# Patient Record
Sex: Male | Born: 1940 | Race: White | Hispanic: No | Smoking: Never smoker
Health system: Southern US, Community
[De-identification: ages and names within clinical notes are randomized; demographics above are authoritative.]

## PROBLEM LIST (undated history)

## (undated) DIAGNOSIS — N529 Male erectile dysfunction, unspecified: Secondary | ICD-10-CM

## (undated) DIAGNOSIS — E669 Obesity, unspecified: Secondary | ICD-10-CM

## (undated) DIAGNOSIS — I10 Essential (primary) hypertension: Secondary | ICD-10-CM

## (undated) DIAGNOSIS — E785 Hyperlipidemia, unspecified: Secondary | ICD-10-CM

## (undated) DIAGNOSIS — J309 Allergic rhinitis, unspecified: Secondary | ICD-10-CM

## (undated) DIAGNOSIS — J42 Unspecified chronic bronchitis: Secondary | ICD-10-CM

## (undated) DIAGNOSIS — T783XXA Angioneurotic edema, initial encounter: Secondary | ICD-10-CM

## (undated) DIAGNOSIS — E119 Type 2 diabetes mellitus without complications: Secondary | ICD-10-CM

## (undated) HISTORY — DX: Hyperlipidemia, unspecified: E78.5

## (undated) HISTORY — DX: Obesity, unspecified: E66.9

## (undated) HISTORY — DX: Angioneurotic edema, initial encounter: T78.3XXA

## (undated) HISTORY — PX: TONSILLECTOMY: SUR1361

## (undated) HISTORY — DX: Type 2 diabetes mellitus without complications: E11.9

## (undated) HISTORY — PX: CATARACT EXTRACTION: SUR2

## (undated) HISTORY — PX: ADENOIDECTOMY: SUR15

## (undated) HISTORY — DX: Essential (primary) hypertension: I10

## (undated) HISTORY — DX: Unspecified chronic bronchitis: J42

## (undated) HISTORY — DX: Allergic rhinitis, unspecified: J30.9

## (undated) HISTORY — DX: Male erectile dysfunction, unspecified: N52.9

---

## 1998-04-11 ENCOUNTER — Ambulatory Visit (HOSPITAL_BASED_OUTPATIENT_CLINIC_OR_DEPARTMENT_OTHER): Admission: RE | Admit: 1998-04-11 | Discharge: 1998-04-11 | Payer: Self-pay | Admitting: Orthopedic Surgery

## 2003-11-16 ENCOUNTER — Ambulatory Visit (HOSPITAL_COMMUNITY): Admission: RE | Admit: 2003-11-16 | Discharge: 2003-11-16 | Payer: Self-pay | Admitting: Gastroenterology

## 2006-09-17 ENCOUNTER — Emergency Department (HOSPITAL_COMMUNITY): Admission: EM | Admit: 2006-09-17 | Discharge: 2006-09-17 | Payer: Self-pay | Admitting: Emergency Medicine

## 2007-08-27 ENCOUNTER — Emergency Department (HOSPITAL_COMMUNITY): Admission: EM | Admit: 2007-08-27 | Discharge: 2007-08-27 | Payer: Self-pay | Admitting: Emergency Medicine

## 2009-11-30 ENCOUNTER — Ambulatory Visit (HOSPITAL_BASED_OUTPATIENT_CLINIC_OR_DEPARTMENT_OTHER): Admission: RE | Admit: 2009-11-30 | Discharge: 2009-11-30 | Payer: Self-pay | Admitting: Orthopedic Surgery

## 2010-12-15 LAB — BASIC METABOLIC PANEL
BUN: 11 mg/dL (ref 6–23)
CO2: 26 mEq/L (ref 19–32)
Calcium: 8.3 mg/dL — ABNORMAL LOW (ref 8.4–10.5)
Chloride: 103 mEq/L (ref 96–112)
Creatinine, Ser: 1.13 mg/dL (ref 0.4–1.5)
GFR calc Af Amer: >60 mL/min (ref >60)
GFR calc non Af Amer: >60 mL/min (ref >60)
Glucose, Bld: 105 mg/dL — ABNORMAL HIGH (ref 70–99)
Potassium: 3.9 mEq/L (ref 3.5–5.1)
Sodium: 134 mEq/L — ABNORMAL LOW (ref 135–145)

## 2010-12-15 LAB — POCT HEMOGLOBIN-HEMACUE: Hemoglobin: 16.4 g/dL (ref 13.0–17.0)

## 2011-02-07 NOTE — Op Note (Signed)
NAME:  Walter Baxter, Walter Baxter NO.:  [id number]   MEDICAL RECORD NO.:  [id number]                   PATIENT TYPE:  AMB   LOCATION:  ENDO                                 FACILITY:  [facility]   PHYSICIAN:  Danise Edge, M.D.                DATE OF BIRTH:  1941/04/21   DATE OF PROCEDURE:  DATE OF DISCHARGE:                                 OPERATIVE REPORT   REFERRING PHYSICIAN:  Donia Guiles, M.D.   PROCEDURE INDICATIONS:  Mr. Walter Baxter is a 70 year old male born on  March 02, 1941.  Mr. Walter Baxter is scheduled to undergo a screening  colonoscopy with polypectomy to prevent colon cancer.   ENDOSCOPIST:  Danise Edge, M.D.   PREMEDICATION:  Versed 10 mg, Demerol 50 mg.   PROCEDURE:  After obtaining informed consent, Mr. Walter Baxter was placed in the  left lateral decubitus position.  I administered intravenous Demerol and  intravenous Versed to achieve conscious sedation for the procedure.  The  patient's blood pressure, oxygen saturation, and cardiac rhythm were  monitored throughout the procedure and documented in the medical record.  Anal inspection was normal.  Digital rectal exam was normal.  The Olympus  adjustable pediatric colonoscope was introduced into the rectum and advanced  into the cecum.  Colonic preparation for the exam today was excellent.  Rectum:  Normal.  Sigmoid colon and descending colon normal.  Splenic  flexure normal.  Transverse colon normal.  Hepatic flexure normal.  Ascending colon normal.  Cecum and ileocecal valve normal.   ASSESSMENT:  Normal screening proctocolonoscopy to the cecum.  No endoscopic  evidence for the presence of colorectal neoplasia.  Walter Baxter does have a  few small colonic diverticula.                                               Danise Edge, M.D.    MJ/MEDQ  D:  11/16/2003  T:  11/16/2003  Job:  [id number]   cc:   Donia Guiles, M.D.  [address] [place]  [place] [address]  Fax: [phone]

## 2011-02-10 ENCOUNTER — Emergency Department (HOSPITAL_COMMUNITY)
Admission: EM | Admit: 2011-02-10 | Discharge: 2011-02-10 | Disposition: A | Discharge status: Home / Self Care | Payer: Medicare Other | Attending: Emergency Medicine | Admitting: Emergency Medicine

## 2011-02-10 ENCOUNTER — Emergency Department (HOSPITAL_COMMUNITY): Payer: Medicare Other

## 2011-02-10 DIAGNOSIS — E785 Hyperlipidemia, unspecified: Secondary | ICD-10-CM | POA: Insufficient documentation

## 2011-02-10 DIAGNOSIS — I4510 Unspecified right bundle-branch block: Secondary | ICD-10-CM | POA: Insufficient documentation

## 2011-02-10 DIAGNOSIS — I10 Essential (primary) hypertension: Secondary | ICD-10-CM | POA: Insufficient documentation

## 2011-02-10 DIAGNOSIS — R0789 Other chest pain: Secondary | ICD-10-CM | POA: Insufficient documentation

## 2011-02-10 LAB — POCT I-STAT, CHEM 8
BUN: 20 mg/dL (ref 6–23)
Calcium, Ion: 0.95 mmol/L — ABNORMAL LOW (ref 1.12–1.32)
Chloride: 111 mEq/L (ref 96–112)
Creatinine, Ser: 1.1 mg/dL (ref 0.4–1.5)
Glucose, Bld: 120 mg/dL — ABNORMAL HIGH (ref 70–99)
HCT: 43 % (ref 39.0–52.0)
Hemoglobin: 14.6 g/dL (ref 13.0–17.0)
Potassium: 3.8 mEq/L (ref 3.5–5.1)
Sodium: 141 mEq/L (ref 135–145)
TCO2: 19 mmol/L (ref 0–100)

## 2011-02-10 LAB — POCT CARDIAC MARKERS
CKMB, poc: <1 ng/mL — ABNORMAL LOW (ref 1.0–8.0)
Myoglobin, poc: 85 ng/mL (ref 12–200)
Troponin i, poc: <0.05 ng/mL (ref 0.00–0.09)

## 2011-06-30 LAB — CBC
HCT: 44.1
Hemoglobin: 15.1
MCHC: 34.2
MCV: 87.4
Platelets: 203
RBC: 5.05
RDW: 13.4
WBC: 8.8

## 2011-06-30 LAB — DIFFERENTIAL
Basophils Absolute: 0
Basophils Relative: 0
Eosinophils Absolute: 0.3
Eosinophils Relative: 3
Lymphocytes Relative: 34
Lymphs Abs: 2.9
Monocytes Absolute: 0.7
Monocytes Relative: 8
Neutro Abs: 4.8
Neutrophils Relative %: 55

## 2011-06-30 LAB — COMPREHENSIVE METABOLIC PANEL
ALT: 23
AST: 22
Albumin: 3.7
Alkaline Phosphatase: 75
BUN: 11
CO2: 24
Calcium: 9
Chloride: 103
Creatinine, Ser: 1.07
GFR calc Af Amer: >60
GFR calc non Af Amer: >60
Glucose, Bld: 112 — ABNORMAL HIGH
Potassium: 3.7
Sodium: 135
Total Bilirubin: 0.8
Total Protein: 6.2

## 2011-06-30 LAB — POCT CARDIAC MARKERS
CKMB, poc: 1.2
CKMB, poc: 1.4
Myoglobin, poc: 105
Myoglobin, poc: 113
Operator id: 294511
Operator id: 294511
Troponin i, poc: <0.05
Troponin i, poc: <0.05

## 2011-06-30 LAB — I-STAT 8, (EC8 V) (CONVERTED LAB)
Acid-base deficit: 3 — ABNORMAL HIGH
BUN: 12
Bicarbonate: 22.8
Chloride: 107
Glucose, Bld: 105 — ABNORMAL HIGH
HCT: 47
Hemoglobin: 16
Operator id: 294511
Potassium: 3.8
Sodium: 141
TCO2: 24
pCO2, Ven: 41.2 — ABNORMAL LOW
pH, Ven: 7.351 — ABNORMAL HIGH

## 2011-06-30 LAB — POCT I-STAT CREATININE
Creatinine, Ser: 1.1
Operator id: 294511

## 2011-06-30 LAB — LIPASE, BLOOD: Lipase: 23

## 2011-06-30 LAB — D-DIMER, QUANTITATIVE: D-Dimer, Quant: <0.22

## 2012-04-12 ENCOUNTER — Other Ambulatory Visit: Payer: Self-pay | Admitting: Family Medicine

## 2012-04-12 ENCOUNTER — Ambulatory Visit
Admission: RE | Admit: 2012-04-12 | Discharge: 2012-04-12 | Disposition: A | Discharge status: Home / Self Care | Payer: Medicare Other | Source: Ambulatory Visit | Attending: Family Medicine | Admitting: Family Medicine

## 2012-04-12 DIAGNOSIS — R252 Cramp and spasm: Secondary | ICD-10-CM

## 2019-03-18 ENCOUNTER — Other Ambulatory Visit: Payer: Self-pay

## 2019-03-18 ENCOUNTER — Encounter: Payer: Self-pay | Admitting: Allergy

## 2019-03-18 ENCOUNTER — Ambulatory Visit (INDEPENDENT_AMBULATORY_CARE_PROVIDER_SITE_OTHER): Payer: Medicare Other | Admitting: Allergy

## 2019-03-18 VITALS — BP 140/82 | HR 67 | Temp 97.8°F | Resp 18 | Ht 66.25 in | Wt 233.2 lb

## 2019-03-18 DIAGNOSIS — L309 Dermatitis, unspecified: Secondary | ICD-10-CM

## 2019-03-18 MED ORDER — MOMETASONE FUROATE 0.1 % EX OINT: TOPICAL_OINTMENT | CUTANEOUS | 5 refills | Status: DC

## 2019-03-18 MED ORDER — HYDROXYZINE HCL 25 MG PO TABS: ORAL_TABLET | ORAL | 5 refills | Status: DC

## 2019-03-18 NOTE — Patient Instructions (Addendum)
Dermatitis  - environmental allergy skin testing is positive to tree pollen, mold, dust mites, mixed feathers.  Avoidance measures provided and discussed.   - common food allergy skin testing is negative  - recommend perform patch testing to determine if there is a contact allergy component to rash.  Patch testing is performed with TRUE test patch panels (see below for list of what's tested).  Patches are best placed on a Monday in office with readings on Wednesday and Friday of same week.  Patches can not get wet once placed.  Can continue use of antihistamines with patches in place.    - use Mometasone ointment on the rash in thin layer daily as needed  - for nighttime itch control recommend use of Hydroxyzine 25mg  at bedtime if needed  - continue daily moisturization with emollients like CeraVe, Aveeno, Aquafor  Follow-up 3 months or sooner if needed    True Test looks for the following sensitivities:

## 2019-03-18 NOTE — Progress Notes (Signed)
New Patient Note  RE: Walter LorLarry G Tamburri MRN: [id number] DOB: 12-06-1940 Date of Office Visit: 03/18/2019  Referring provider: [name], [name], MD Primary care provider: [name], [name], MD  Chief Complaint: rash  History of present illness: Walter Baxter is a 78 y.o. male presenting today for consultation for rash.   He has a rash that is very itchy.  The rash is on his arms, legs primarily.  He has had this rash for over a year on and off.  The rash does interfere with sleep due to the itch.  If he wears long pajamas he states he doesn't really itch and can sleep better.  He states the rash cleared in Oct/November and then returned around the beginning of the year.  It cleared up again in March/April and returned again in May.  He reports he uses cortisone and neosporin on the rash.     He has seen 2 dermatologists for this rash.  He was prescribed a cream and he is unsure if it helped or not.  He states the cream started with "hydro".  He states the 1st dermatologist in September told him it could be related exposure to "mites".   He saw a 2nd dermatologist in November and had some type of procedure done but states it was not a biopsy.  He is unsure what was done at this visit.    He states he had his house fumigated but insects in October.    With the rash he denies any swelling, joint aches/pains, fevers.  Denies any preceding illnesses, no new medications, no new foods, no stings/bites, soaps/lotions/detergents.   He reports his wife does not have a similar rash.   For moisturization he uses either CeraVe or Aveeno.    He takes a Claritin daily for years.  He states he does not have any symptoms of allergic rhinitis or conjunctivitis at this time and feels that the Claritin helps to prevent those symptoms.    He denies history of asthma, eczema of food allergy.     Review of systems: Review of Systems  Constitutional: Negative for chills, fever and malaise/fatigue.  HENT:  Negative for congestion, nosebleeds and sore throat.   Eyes: Negative for pain, discharge and redness.  Respiratory: Negative for cough, shortness of breath and wheezing.   Cardiovascular: Negative for chest pain.  Gastrointestinal: Negative for abdominal pain, constipation, diarrhea, heartburn, nausea and vomiting.  Musculoskeletal: Negative for joint pain.  Skin: Positive for itching and rash.  Neurological: Negative for headaches.    All other systems negative unless noted above in HPI  Past medical history: Past Medical History:  Diagnosis Date  . Allergic rhinitis   . Chronic bronchitis (HCC)   . Erectile dysfunction   . Essential hypertension   . Hyperlipidemia   . Obesity   . Type 2 diabetes mellitus (HCC)     Past surgical history: Past Surgical History:  Procedure Laterality Date  . ADENOIDECTOMY    . TONSILLECTOMY      Family history:  Family History  Problem Relation Age of Onset  . Lupus Sister     Social history: Lives in a home with carpeting in the bedroom and family room with electric heating and central cooling.  No pets in the home.  No concern for water damage, mildew or roaches in the home.  He is retired.  Denies a smoking history.  Medication List: Allergies as of 03/18/2019      Reactions  Other Rash   Magic mouth wash       Medication List       Accurate as of March 18, 2019  1:17 PM. If you have any questions, ask your nurse or doctor.        hydrOXYzine 25 MG tablet Commonly known as: ATARAX/VISTARIL Take 1 tablet by mouth as needed at bedtime Started by: Vandella Ord Charmian Muff, MD   metoprolol tartrate 50 MG tablet Commonly known as: LOPRESSOR TK 1 T PO BID   mometasone 0.1 % ointment Commonly known as: ELOCON Apply thin layer to skin once daily as needed Started by: Florencia Zaccaro Charmian Muff, MD   pravastatin 40 MG tablet Commonly known as: PRAVACHOL TK 1 T PO QD       Known medication allergies: Allergies   Allergen Reactions  . Other Rash    Magic mouth wash      Physical examination: Blood pressure 140/82, pulse 67, temperature 97.8 F (36.6 C), temperature source Temporal, resp. rate 18, height 5' 6.25" (1.683 m), weight 233 lb 3.2 oz (105.8 kg), SpO2 96 %.  General: Alert, interactive, in no acute distress. HEENT: PERRLA, TMs pearly gray, turbinates non-edematous without discharge, post-pharynx non erythematous. Neck: Supple without lymphadenopathy. Lungs: Clear to auscultation without wheezing, rhonchi or rales. {no increased work of breathing. CV: Normal S1, S2 without murmurs. Abdomen: Nondistended, nontender. Skin: Erythematous excoriated papules over bilateral legs and bilateral arm.  Several erythematous linear scratch marks over upper arms.. Extremities:  No clubbing, cyanosis or edema. Neuro:   Grossly intact.  Diagnositics/Labs:  Allergy testing: Environmental allergy skin prick testing is positive to walnut pollen, Curvularia, dust mites and mixed feathers. 10 food common allergen skin testing is negative. Allergy testing results were read and interpreted by provider, documented by clinical staff.   Assessment and plan:   Dermatitis  - environmental allergy skin testing is positive to tree pollen, mold, dust mites, mixed feathers.  Avoidance measures provided and discussed.   - common food allergy skin testing is negative  - recommend perform patch testing to determine if there is a contact allergy component to rash.  Patch testing is performed with TRUE test patch panels (see below for list of what's tested).  Patches are best placed on a Monday in office with readings on Wednesday and Friday of same week.  Patches can not get wet once placed.  Can continue use of antihistamines with patches in place.    - use Mometasone ointment on the rash in thin layer daily as needed  - for nighttime itch control recommend use of Hydroxyzine 25mg  at bedtime if needed  - continue  daily moisturization with emollients like CeraVe, Aveeno, Aquafor  Follow-up 3 months or sooner if needed  I appreciate the opportunity to take part in Kenta's care. Please do not hesitate to contact me with questions.  Sincerely,   Prudy Feeler, MD Allergy/Immunology [facility] [place] [facility] [place]

## 2019-06-15 ENCOUNTER — Ambulatory Visit: Payer: Medicare Other | Admitting: Allergy

## 2019-07-21 ENCOUNTER — Other Ambulatory Visit: Payer: Self-pay

## 2019-07-21 ENCOUNTER — Encounter: Payer: Self-pay | Admitting: Allergy

## 2019-07-21 ENCOUNTER — Ambulatory Visit: Payer: Medicare Other | Admitting: Allergy

## 2019-07-21 VITALS — BP 144/80 | HR 70 | Temp 98.0°F | Resp 18

## 2019-07-21 DIAGNOSIS — T783XXD Angioneurotic edema, subsequent encounter: Secondary | ICD-10-CM

## 2019-07-21 MED ORDER — FAMOTIDINE 20 MG PO TABS
20.0000 mg | ORAL_TABLET | Freq: Two times a day (BID) | ORAL | 5 refills | Status: DC
Start: 2019-07-21 — End: 2019-08-16

## 2019-07-21 NOTE — Progress Notes (Signed)
Follow-up Note  RE: Walter Baxter MRN: [id number] DOB: 1941/05/05 Date of Office Visit: 07/21/2019   History of present illness: Walter Baxter is a 78 y.o. male presenting today for sick visit for swelling.  He was last seen in the office on 03/18/2019 by dermatitis that has since cleared.    He had facial swelling about 4 months ago that resolved.  He saw his PCP at that time and was treated with a 10 day prednisone course.   He also reports having hand swelling back in June this year.   He had return of facial swelling on Friday and saw his PCP again.  He states he was recommended to take zyrtec daily.   The swelling involved his scalp extending down to his forehead and eyes.  This morning he states he woke up with swelling on top of his head.  Has been using ice compresses.   No itching.  No fevers, chills, sweating.  Has not changed his diet at all.  Can not recall anything that he has done differently.  He reports eating hamburger 1-2 times a week.  Did have steak on Sunday.  Meatloaf once a week.   No family history of swelling that he is aware of.    Review of systems: Review of Systems  Constitutional: Negative for chills, fever and malaise/fatigue.  HENT: Negative for congestion, ear discharge, nosebleeds and sore throat.   Eyes: Negative for pain, discharge and redness.  Respiratory: Negative.   Cardiovascular: Negative.   Gastrointestinal: Negative.   Musculoskeletal: Negative.   Skin: Negative for itching and rash.  Neurological: Negative.     All other systems negative unless noted above in HPI  Past medical/social/surgical/family history have been reviewed and are unchanged unless specifically indicated below.  No changes  Medication List: Current Outpatient Medications  Medication Sig Dispense Refill  . cetirizine (ZYRTEC) 10 MG tablet Take 10 mg by mouth daily.    . metoprolol tartrate (LOPRESSOR) 50 MG tablet TK 1 T PO BID    . mometasone (ELOCON) 0.1  % ointment Apply thin layer to skin once daily as needed 45 g 5  . famotidine (PEPCID) 20 MG tablet Take 1 tablet (20 mg total) by mouth 2 (two) times daily. 60 tablet 5  . hydrOXYzine (ATARAX/VISTARIL) 25 MG tablet Take 1 tablet by mouth as needed at bedtime (Patient not taking: Reported on 07/21/2019) 30 tablet 5  . pravastatin (PRAVACHOL) 40 MG tablet TK 1 T PO QD     No current facility-administered medications for this visit.      Known medication allergies: Allergies  Allergen Reactions  . Other Rash    Magic mouth wash      Physical examination: Blood pressure (!) 144/80, pulse 70, temperature 98 F (36.7 C), temperature source Temporal, resp. rate 18, SpO2 98 %.  General: Alert, interactive, in no acute distress. HEENT: PERRLA, mild periorbital edema of the right lower lid, TMs pearly gray, turbinates non-edematous without discharge, post-pharynx non erythematous. Neck: Supple without lymphadenopathy. Lungs: Clear to auscultation without wheezing, rhonchi or rales. {no increased work of breathing. CV: Normal S1, S2 without murmurs. Abdomen: Nondistended, nontender. Skin: Warm and dry, without lesions or rashes. Extremities:  No clubbing, cyanosis or edema. Neuro:   Grossly intact.  Diagnositics/Labs: None today  Assessment and plan: Angioedema, isolated  - you have now had 3 episodes of swelling (hands and facial)  - will obtain labs to determine if swelling is related to  Hereditary Angioedema (HAE).   HAE can be hereditary or acquired and can cause recurrent episodes of swelling.  Swelling can occur anywhere on the body.  Treatment is very different than if swelling is due to allergen exposure.  Will obtain HAE panel as well as tryptase level and alpha gal panel  - will also rule out red meat allergy (alpha gal allergy).  Would avoid red meat in diet until labs return.  If alpha gal is positive then will prescribe an epinephrine device for you  - at this time would  recommend taking Zyrtec 1 tablet and Pepcid 1 tablet twice a day until swelling improves  - will provide with a prednisone 5day pack if swelling returns to take.   - will call you in about a week in all the labs return.   Follow-up 2-3 months or sooner if needed  I appreciate the opportunity to take part in Walter Baxter's care. Please do not hesitate to contact me with questions.  Sincerely,   Prudy Feeler, MD Allergy/Immunology [facility] [place] [facility] [place]

## 2019-07-21 NOTE — Patient Instructions (Addendum)
Swelling  - you have now had 3 episodes of swelling (hands and facial)  - will obtain labs to determine if swelling is related to Hereditary Angioedema (HAE).   HAE can be hereditary or acquired and can cause recurrent episodes of swelling.  Swelling can occur anywhere on the body.  Treatment is very different than if swelling is due to allergen exposure.  Will obtain HAE panel as well as tryptase level and alpha gal panel  - will also rule out red meat allergy (alpha gal allergy).  Would avoid red meat in diet until labs return.  If alpha gal is positive then will prescribe an epinephrine device for you  - at this time would recommend taking Zyrtec 1 tablet and Pepcid 1 tablet twice a day until swelling improves  - will provide with a prednisone 5day pack if swelling returns to take.   - will call you in about a week in all the labs return.   Follow-up 2-3 months or sooner if needed

## 2019-07-30 LAB — C4 COMPLEMENT: Complement C4, Serum: 30 mg/dL (ref 12–38)

## 2019-07-30 LAB — ALPHA-GAL PANEL
Alpha Gal IgE*: 1.29 kU/L — ABNORMAL HIGH (ref <0.10)
Beef (Bos spp) IgE: 0.41 kU/L — ABNORMAL HIGH (ref <0.35)
Class Interpretation: 1
Lamb/Mutton (Ovis spp) IgE: 0.14 kU/L (ref <0.35)
Pork (Sus spp) IgE: 0.2 kU/L (ref <0.35)

## 2019-07-30 LAB — C1 ESTERASE INHIBITOR, FUNCTIONAL: C1INH Functional/C1INH Total MFr SerPl: 90 %mean normal

## 2019-07-30 LAB — COMPLEMENT COMPONENT C1Q: Complement C1Q: 16.4 mg/dL (ref 10.2–20.3)

## 2019-07-30 LAB — TRYPTASE: Tryptase: 12.6 ug/L (ref 2.2–13.2)

## 2019-07-30 LAB — C1 ESTERASE INHIBITOR: C1INH SerPl-mCnc: 33 mg/dL (ref 21–39)

## 2019-08-02 ENCOUNTER — Telehealth: Payer: Self-pay | Admitting: Allergy

## 2019-08-02 MED ORDER — EPINEPHRINE 0.3 MG/0.3ML IJ SOAJ: 0.3000 mg | Freq: Once | INTRAMUSCULAR | 2 refills | Status: AC

## 2019-08-02 NOTE — Telephone Encounter (Signed)
Left message on machine to call back  

## 2019-08-02 NOTE — Telephone Encounter (Addendum)
Patient states that he ate a hamburger pizza and started having swelling again.  Pt is asking what he is supposed to do.  According to his last note, he was instructed to continue pepcid and zyrtec and take prednisone if swelling came back.  Pt was okay with this plan and will start the prednisone pack given to him at the visit.  Lab results have also been given.

## 2019-08-02 NOTE — Telephone Encounter (Signed)
Dr Padgett please advise 

## 2019-08-02 NOTE — Addendum Note (Signed)
Addended by: Neomia Dear on: 08/02/2019 04:25 PM   Modules accepted: Orders

## 2019-08-02 NOTE — Telephone Encounter (Signed)
Results are in

## 2019-08-02 NOTE — Telephone Encounter (Signed)
Patient called stating that he was still waiting on a call regarding his labs. I informed the patient that the doctor was still reviewing the results.   Patient stated that the swelling on his head was back. Patient said that he has two knots on his head. Patient states that he was given Prednisone tablets on his visit and would like to know if he should continue taking them.  Please advise.

## 2019-08-02 NOTE — Telephone Encounter (Signed)
I also advised him to avoid all red meat products until the labs return as outlined in the AVS.   The hamburger on the pizza is absolutely the cause of the swelling.  He needs to strictly avoid all red meat products and be very cautious with dairy and gelatin products as these have also been an issue in some people with alpha gal allergy.   For current episode agree with taking the prednisone pack provided and continue with pepcid and zyrtec.  Please ensure he has access to an epinephrine device.

## 2019-08-08 NOTE — Telephone Encounter (Signed)
Called and left message for patient to call the office in regards to this matter.

## 2019-08-10 NOTE — Telephone Encounter (Signed)
Mychart message sent regarding recent allergic reaction. Patient will need to contact the office or reply back regarding further evaluation

## 2019-08-16 MED ORDER — FAMOTIDINE 20 MG PO TABS: 20.0000 mg | ORAL_TABLET | Freq: Two times a day (BID) | ORAL | 1 refills | Status: DC

## 2019-08-16 MED ORDER — MONTELUKAST SODIUM 10 MG PO TABS: 10.0000 mg | ORAL_TABLET | Freq: Every day | ORAL | 1 refills | Status: DC

## 2019-08-16 NOTE — Telephone Encounter (Signed)
Patient informed me that he had a glass of milk and cheese and will try to avoid them and will take his regimen medication along with Singulair.

## 2019-08-16 NOTE — Addendum Note (Signed)
Addended by: Valere Dross on: 08/16/2019 04:14 PM   Modules accepted: Orders

## 2019-08-16 NOTE — Telephone Encounter (Signed)
Patient complains that he is having swelling on the back of his head (top) and facial swelling since last week on and off and is taking prednisone from previous episodes. Patient states he still take his antihistamines and Pepcid as prescribed. No shortness of breath, no pain in swelling spots and keeps having them. Please advise on what he needs to do?

## 2019-08-16 NOTE — Telephone Encounter (Signed)
Is he avoiding all red meats? He may also need to avoid dairy in diet if he is swelling related to dairy consumption with alpha gal.  He also should avoid all gelatin based items as well like any medications in gelcaps for example.   If he is doing his antihistamine and pepcid both twice a day and still swelling on top of that then recommend adding in Singulair 10mg  daily to this regimen.  Please advise that If he notices any change in mood/behavior/sleep after starting Singulair then stop this medication and let us know.  Symptoms resolve after stopping the medication.

## 2019-08-16 NOTE — Telephone Encounter (Signed)
Dr. Nelva Bush for your knowledge.

## 2019-09-21 NOTE — Telephone Encounter (Signed)
Dr. Nelva Bush please advise on further instructions and thank you.

## 2019-09-21 NOTE — Telephone Encounter (Signed)
Does he have access to a camera phone where he can take a picture of the knots and upload them to my chart so we can be able to see the knots.  I would like to make sure that this is true swelling and not some other entity causing the knots. I am glad to hear that he is strictly avoiding all red meats as well as dairy. Is he taking the Zyrtec and Pepcid 1 tablet both twice a day?  I would continue the montelukast for now. If he is doing the above then I think he should come in for a visit when he has the swelling so that I can visualize what is going on. There are several open slots for tomorrow as he potentially able to come in tomorrow if he continues to have the knots into tomorrow?

## 2019-09-21 NOTE — Telephone Encounter (Signed)
Attempted to call patient.  Left message.  

## 2019-09-21 NOTE — Telephone Encounter (Signed)
Patient called back stating that he is still having knots on the top of his head. Patient states he gets nauseous in the mornings, but is not sure if it is from what he is eating or montelukast. Patient states that he is staying away from all red meats and dairy products and is checking all foods for these. Patient is taking all of his antihistamines as directed, Zyrtec and Pepcid, and he is taking montelukast which he states is not helping at all. Patient states that he is having to get prednisone or steroid shots to get the swelling under control. Patient would like to know if there is something else he can do or take instead of having to get prednisone or steroid shots every time he has swelling.   Please advise.

## 2019-10-20 ENCOUNTER — Other Ambulatory Visit: Payer: Self-pay

## 2019-10-20 ENCOUNTER — Inpatient Hospital Stay
Admission: EM | Admit: 2019-10-20 | Discharge: 2019-11-05 | DRG: 177 | Disposition: A | Discharge status: Other Acute Inpatient Hospital | Payer: Medicare Other | Attending: Internal Medicine | Admitting: Internal Medicine

## 2019-10-20 ENCOUNTER — Emergency Department: Payer: Medicare Other

## 2019-10-20 DIAGNOSIS — N179 Acute kidney failure, unspecified: Secondary | ICD-10-CM | POA: Diagnosis present

## 2019-10-20 DIAGNOSIS — J9601 Acute respiratory failure with hypoxia: Secondary | ICD-10-CM | POA: Diagnosis present

## 2019-10-20 DIAGNOSIS — N390 Urinary tract infection, site not specified: Secondary | ICD-10-CM | POA: Diagnosis present

## 2019-10-20 DIAGNOSIS — R609 Edema, unspecified: Secondary | ICD-10-CM

## 2019-10-20 DIAGNOSIS — Z79899 Other long term (current) drug therapy: Secondary | ICD-10-CM

## 2019-10-20 DIAGNOSIS — R531 Weakness: Secondary | ICD-10-CM | POA: Diagnosis not present

## 2019-10-20 DIAGNOSIS — E119 Type 2 diabetes mellitus without complications: Secondary | ICD-10-CM

## 2019-10-20 DIAGNOSIS — E785 Hyperlipidemia, unspecified: Secondary | ICD-10-CM

## 2019-10-20 DIAGNOSIS — R0902 Hypoxemia: Secondary | ICD-10-CM

## 2019-10-20 DIAGNOSIS — J1282 Pneumonia due to coronavirus disease 2019: Secondary | ICD-10-CM | POA: Diagnosis present

## 2019-10-20 DIAGNOSIS — R0602 Shortness of breath: Secondary | ICD-10-CM

## 2019-10-20 DIAGNOSIS — I10 Essential (primary) hypertension: Secondary | ICD-10-CM

## 2019-10-20 DIAGNOSIS — T380X5A Adverse effect of glucocorticoids and synthetic analogues, initial encounter: Secondary | ICD-10-CM | POA: Diagnosis present

## 2019-10-20 DIAGNOSIS — U071 COVID-19: Secondary | ICD-10-CM | POA: Diagnosis present

## 2019-10-20 DIAGNOSIS — E872 Acidosis: Secondary | ICD-10-CM | POA: Diagnosis present

## 2019-10-20 DIAGNOSIS — B9620 Unspecified Escherichia coli [E. coli] as the cause of diseases classified elsewhere: Secondary | ICD-10-CM | POA: Diagnosis present

## 2019-10-20 DIAGNOSIS — E1165 Type 2 diabetes mellitus with hyperglycemia: Secondary | ICD-10-CM | POA: Diagnosis present

## 2019-10-20 DIAGNOSIS — J984 Other disorders of lung: Secondary | ICD-10-CM

## 2019-10-20 LAB — FIBRINOGEN: Fibrinogen: >750 mg/dL — ABNORMAL HIGH (ref 210–475)

## 2019-10-20 LAB — CBC WITH DIFFERENTIAL/PLATELET
Abs Immature Granulocytes: 0.12 10*3/uL — ABNORMAL HIGH (ref 0.00–0.07)
Basophils Absolute: 0 10*3/uL (ref 0.0–0.1)
Basophils Relative: 0 %
Eosinophils Absolute: 0 10*3/uL (ref 0.0–0.5)
Eosinophils Relative: 0 %
HCT: 45.2 % (ref 39.0–52.0)
Hemoglobin: 15.2 g/dL (ref 13.0–17.0)
Immature Granulocytes: 1 %
Lymphocytes Relative: 4 %
Lymphs Abs: 0.4 10*3/uL — ABNORMAL LOW (ref 0.7–4.0)
MCH: 29.2 pg (ref 26.0–34.0)
MCHC: 33.6 g/dL (ref 30.0–36.0)
MCV: 86.9 fL (ref 80.0–100.0)
Monocytes Absolute: 0.6 10*3/uL (ref 0.1–1.0)
Monocytes Relative: 6 %
Neutro Abs: 8.7 10*3/uL — ABNORMAL HIGH (ref 1.7–7.7)
Neutrophils Relative %: 89 %
Platelets: 243 10*3/uL (ref 150–400)
RBC: 5.2 MIL/uL (ref 4.22–5.81)
RDW: 13.2 % (ref 11.5–15.5)
WBC: 9.8 10*3/uL (ref 4.0–10.5)
nRBC: 0 % (ref 0.0–0.2)

## 2019-10-20 LAB — COMPREHENSIVE METABOLIC PANEL
ALT: 24 U/L (ref 0–44)
AST: 46 U/L — ABNORMAL HIGH (ref 15–41)
Albumin: 2.7 g/dL — ABNORMAL LOW (ref 3.5–5.0)
Alkaline Phosphatase: 52 U/L (ref 38–126)
Anion gap: 13 (ref 5–15)
BUN: 21 mg/dL (ref 8–23)
CO2: 22 mmol/L (ref 22–32)
Calcium: 7.8 mg/dL — ABNORMAL LOW (ref 8.9–10.3)
Chloride: 100 mmol/L (ref 98–111)
Creatinine, Ser: 1.33 mg/dL — ABNORMAL HIGH (ref 0.61–1.24)
GFR calc Af Amer: 59 mL/min — ABNORMAL LOW (ref >60)
GFR calc non Af Amer: 51 mL/min — ABNORMAL LOW (ref >60)
Glucose, Bld: 141 mg/dL — ABNORMAL HIGH (ref 70–99)
Potassium: 3.7 mmol/L (ref 3.5–5.1)
Sodium: 135 mmol/L (ref 135–145)
Total Bilirubin: 1.2 mg/dL (ref 0.3–1.2)
Total Protein: 6.8 g/dL (ref 6.5–8.1)

## 2019-10-20 LAB — BRAIN NATRIURETIC PEPTIDE: B Natriuretic Peptide: 179 pg/mL — ABNORMAL HIGH (ref 0.0–100.0)

## 2019-10-20 LAB — FERRITIN: Ferritin: 3396 ng/mL — ABNORMAL HIGH (ref 24–336)

## 2019-10-20 LAB — TRIGLYCERIDES: Triglycerides: 249 mg/dL — ABNORMAL HIGH (ref <150)

## 2019-10-20 LAB — FIBRIN DERIVATIVES D-DIMER (ARMC ONLY): Fibrin derivatives D-dimer (ARMC): 2932.23 ng/mL (FEU) — ABNORMAL HIGH (ref 0.00–499.00)

## 2019-10-20 LAB — RESPIRATORY PANEL BY RT PCR (FLU A&B, COVID)
Influenza A by PCR: NEGATIVE
Influenza B by PCR: NEGATIVE
SARS Coronavirus 2 by RT PCR: POSITIVE — AB

## 2019-10-20 LAB — PROCALCITONIN: Procalcitonin: 0.21 ng/mL

## 2019-10-20 LAB — ABO/RH: ABO/RH(D): O POS

## 2019-10-20 LAB — C-REACTIVE PROTEIN: CRP: 25.4 mg/dL — ABNORMAL HIGH (ref <1.0)

## 2019-10-20 LAB — LACTIC ACID, PLASMA
Lactic Acid, Venous: 3.7 mmol/L (ref 0.5–1.9)
Lactic Acid, Venous: 2.5 mmol/L (ref 0.5–1.9)

## 2019-10-20 LAB — LACTATE DEHYDROGENASE: LDH: 363 U/L — ABNORMAL HIGH (ref 98–192)

## 2019-10-20 MED ORDER — FAMOTIDINE 20 MG PO TABS
20.0000 mg | ORAL_TABLET | Freq: Two times a day (BID) | ORAL | Status: DC
  Administered 2019-10-20 – 2019-11-05 (×32): 20 mg via ORAL
  Filled 2019-10-20 (×32): qty 1

## 2019-10-20 MED ORDER — GUAIFENESIN-DM 100-10 MG/5ML PO SYRP
10.0000 mL | ORAL_SOLUTION | ORAL | Status: DC | PRN
  Administered 2019-10-28 – 2019-10-29 (×2): 10 mL via ORAL
  Filled 2019-10-20 (×3): qty 10

## 2019-10-20 MED ORDER — ENOXAPARIN SODIUM 40 MG/0.4ML ~~LOC~~ SOLN
40.0000 mg | SUBCUTANEOUS | Status: DC
  Administered 2019-10-20 – 2019-10-25 (×6): 40 mg via SUBCUTANEOUS
  Filled 2019-10-20 (×6): qty 0.4

## 2019-10-20 MED ORDER — ACETAMINOPHEN 325 MG PO TABS
650.0000 mg | ORAL_TABLET | Freq: Four times a day (QID) | ORAL | Status: DC | PRN
  Administered 2019-10-20 – 2019-11-01 (×3): 650 mg via ORAL
  Filled 2019-10-20 (×4): qty 2

## 2019-10-20 MED ORDER — HYDROCOD POLST-CPM POLST ER 10-8 MG/5ML PO SUER: 5.0000 mL | Freq: Two times a day (BID) | ORAL | Status: DC | PRN

## 2019-10-20 MED ORDER — IPRATROPIUM-ALBUTEROL 20-100 MCG/ACT IN AERS
1.0000 | INHALATION_SPRAY | Freq: Four times a day (QID) | RESPIRATORY_TRACT | Status: DC
  Administered 2019-10-20 – 2019-11-05 (×60): 1 via RESPIRATORY_TRACT
  Filled 2019-10-20: qty 4

## 2019-10-20 MED ORDER — ONDANSETRON HCL 4 MG PO TABS: 4.0000 mg | ORAL_TABLET | Freq: Four times a day (QID) | ORAL | Status: DC | PRN

## 2019-10-20 MED ORDER — SODIUM CHLORIDE 0.9 % IV SOLN
100.0000 mg | Freq: Every day | INTRAVENOUS | Status: AC
  Administered 2019-10-21 – 2019-10-24 (×4): 100 mg via INTRAVENOUS
  Filled 2019-10-20: qty 100
  Filled 2019-10-20 (×2): qty 20
  Filled 2019-10-20: qty 100

## 2019-10-20 MED ORDER — METOPROLOL TARTRATE 50 MG PO TABS
50.0000 mg | ORAL_TABLET | Freq: Two times a day (BID) | ORAL | Status: DC
  Administered 2019-10-20 – 2019-10-29 (×14): 50 mg via ORAL
  Filled 2019-10-20 (×17): qty 1

## 2019-10-20 MED ORDER — SODIUM CHLORIDE 0.9 % IV BOLUS
1000.0000 mL | Freq: Once | INTRAVENOUS | Status: AC
  Administered 2019-10-20: 1000 mL via INTRAVENOUS

## 2019-10-20 MED ORDER — DEXAMETHASONE SODIUM PHOSPHATE 10 MG/ML IJ SOLN
6.0000 mg | INTRAMUSCULAR | Status: DC
  Administered 2019-10-20 – 2019-10-27 (×8): 6 mg via INTRAVENOUS
  Filled 2019-10-20 (×8): qty 1

## 2019-10-20 MED ORDER — PRAVASTATIN SODIUM 40 MG PO TABS: 40.0000 mg | ORAL_TABLET | Freq: Every day | ORAL | Status: DC

## 2019-10-20 MED ORDER — ZINC SULFATE 220 (50 ZN) MG PO CAPS
220.0000 mg | ORAL_CAPSULE | Freq: Every day | ORAL | Status: DC
  Administered 2019-10-20 – 2019-11-02 (×14): 220 mg via ORAL
  Filled 2019-10-20 (×14): qty 1

## 2019-10-20 MED ORDER — SODIUM CHLORIDE 0.9% FLUSH
3.0000 mL | Freq: Two times a day (BID) | INTRAVENOUS | Status: DC
  Administered 2019-10-20 – 2019-11-05 (×31): 3 mL via INTRAVENOUS

## 2019-10-20 MED ORDER — ASCORBIC ACID 500 MG PO TABS
500.0000 mg | ORAL_TABLET | Freq: Every day | ORAL | Status: DC
  Administered 2019-10-20 – 2019-11-02 (×14): 500 mg via ORAL
  Filled 2019-10-20 (×14): qty 1

## 2019-10-20 MED ORDER — ONDANSETRON HCL 4 MG/2ML IJ SOLN: 4.0000 mg | Freq: Four times a day (QID) | INTRAMUSCULAR | Status: DC | PRN

## 2019-10-20 MED ORDER — MONTELUKAST SODIUM 10 MG PO TABS: 10.0000 mg | ORAL_TABLET | Freq: Every day | ORAL | Status: DC

## 2019-10-20 MED ORDER — SODIUM CHLORIDE 0.9 % IV SOLN
200.0000 mg | Freq: Once | INTRAVENOUS | Status: AC
  Administered 2019-10-20: 200 mg via INTRAVENOUS
  Filled 2019-10-20: qty 200

## 2019-10-20 NOTE — Progress Notes (Signed)
Report called to tanesha rn on 2a.

## 2019-10-20 NOTE — ED Triage Notes (Signed)
Pt to ED via [facility] for chief complaint of [facility] and weakness, COVID exposure. EMS reports pt 80's on RA, placed on NRB to sat mid 90's. EMS reports BP 118/68 CBG 200, 20G placed Left hand. Afebrile, non productive cough, reports loss of taste and smell over the last week.  EDP at bedside

## 2019-10-20 NOTE — ED Notes (Signed)
Dr. Lenard Lance notified of Lactic 3.7. Bolus ordered

## 2019-10-20 NOTE — ED Provider Notes (Signed)
[facility] Emergency Department Provider Note  Time seen: 5:27 PM  I have reviewed the triage vital signs and the nursing notes.   HISTORY  Chief Complaint Shortness of Breath and Weakness   HPI Walter Baxter is a 79 y.o. male with a past medical history of hypertension, hyperlipidemia, presents to the emergency department  with generalized weakness fatigue shortness of breath.  Patient states his wife is currently admitted to the hospital with Covid, states he was feeling well until yesterday evening when the he began feeling mildly short of breath very weak and having chills but no known fever per patient.  Denies any abdominal pain vomiting or diarrhea.  Denies chest pain.  States he was feeling quite short of breath initially but feels better with oxygen.  Initial room air saturation in the 80s per EMS.  Past Medical History:  Diagnosis Date  . Allergic rhinitis   . Angio-edema   . Chronic bronchitis (HCC)   . Erectile dysfunction   . Essential hypertension   . Hyperlipidemia   . Obesity   . Type 2 diabetes mellitus (HCC)     There are no problems to display for this patient.   Past Surgical History:  Procedure Laterality Date  . ADENOIDECTOMY    . TONSILLECTOMY      Prior to Admission medications   Medication Sig Start Date End Date Taking? Authorizing Provider  cetirizine (ZYRTEC) 10 MG tablet Take 10 mg by mouth daily.    [provider]  famotidine (PEPCID) 20 MG tablet Take 1 tablet (20 mg total) by mouth 2 (two) times daily. 08/16/19   Marcelyn Bruins, MD  hydrOXYzine (ATARAX/VISTARIL) 25 MG tablet Take 1 tablet by mouth as needed at bedtime Patient not taking: Reported on 07/21/2019 03/18/19   Marcelyn Bruins, MD  metoprolol tartrate (LOPRESSOR) 50 MG tablet TK 1 T PO BID 12/01/18   [provider]  mometasone (ELOCON) 0.1 % ointment Apply thin layer to skin once daily as needed 03/18/19   Marcelyn Bruins, MD  montelukast (SINGULAIR) 10 MG tablet Take 1 tablet (10 mg total) by mouth at bedtime. 08/16/19   Marcelyn Bruins, MD  pravastatin (PRAVACHOL) 40 MG tablet TK 1 T PO QD 03/09/19   [provider]    Allergies  Allergen Reactions  . Other Rash    Magic mouth wash     Family History  Problem Relation Age of Onset  . Lupus Sister     Social History Social History   Tobacco Use  . Smoking status: Never Smoker  . Smokeless tobacco: Never Used  Substance Use Topics  . Alcohol use: Not Currently  . Drug use: Never    Review of Systems Constitutional: Negative for fever. Cardiovascular: Negative for chest pain. Respiratory: Negative for shortness of breath. Gastrointestinal: Negative for abdominal pain, vomiting  Musculoskeletal: Negative for musculoskeletal complaints Neurological: Negative for headache All other ROS negative  ____________________________________________   PHYSICAL EXAM:  VITAL SIGNS: ED Triage Vitals [10/20/19 1724]  Enc Vitals Group     BP      Pulse      Resp      Temp      Temp src      SpO2      Weight 232 lb (105.2 kg)     Height 5\' 8"  (1.727 m)     Head Circumference      Peak Flow      Pain  Score 0     Pain Loc      Pain Edu?      Excl. in [place]?    Constitutional: Alert and oriented. Well appearing and in no distress. Eyes: Normal exam ENT      Head: Normocephalic and atraumatic.      Mouth/Throat: Mucous membranes are moist. Cardiovascular: Normal rate, regular rhythm. No murmur Respiratory: Normal respiratory effort without tachypnea nor retractions. Breath sounds are clear  Gastrointestinal: Soft and nontender. No distention.   Musculoskeletal: Nontender with normal range of motion in all extremities.  Neurologic:  Normal speech and language. No gross focal neurologic deficits  Skin:  Skin is warm, dry and intact.  Psychiatric: Mood and affect are normal.    ____________________________________________    EKG  EKG viewed and interpreted by myself shows sinus tachycardia 102 bpm the narrow QRS, normal axis, normal intervals nonspecific ST changes.  ____________________________________________    RADIOLOGY  Chest x-ray shows multifocal pneumonia  ____________________________________________   INITIAL IMPRESSION / ASSESSMENT AND PLAN / ED COURSE  Pertinent labs & imaging results that were available during my care of the patient were reviewed by me and considered in my medical decision making (see chart for details).   Patient presents to the emergency department for generalized fatigue weakness shortness of breath wife is Covid positive.  Symptoms started yesterday per patient.  Patient found to be febrile in the emergency department tachypneic currently satting 95% on 6 L of nasal cannula oxygen.  We will check labs, Covid test, chest x-ray and continue to closely monitor.  We will dose IV fluids while awaiting results.  Patient's lactate is elevated, patient receiving IV fluids.  Covid test pending.  Remainder the lab work pending.  Labs show elevated inflammatory markers chest x-ray consistent with multifocal pneumonia Covid test is positive.  Patient will be admitted to the hospital service for further treatment and work-up.  KARA MIERZEJEWSKI was evaluated in Emergency Department on 10/20/2019 for the symptoms described in the history of present illness. He was evaluated in the context of the global COVID-19 pandemic, which necessitated consideration that the patient might be at risk for infection with the SARS-CoV-2 virus that causes COVID-19. Institutional protocols and algorithms that pertain to the evaluation of patients at risk for COVID-19 are in a state of rapid change based on information released by regulatory bodies including the CDC and federal and state organizations. These policies and algorithms were followed during the  patient's care in the ED.  ____________________________________________   FINAL CLINICAL IMPRESSION(S) / ED DIAGNOSES  Dyspnea Weakness Hypoxia COVID-19   Harvest Dark, MD 10/20/19 1924

## 2019-10-20 NOTE — ED Notes (Signed)
Pt given prn tylenol for generalized body aches

## 2019-10-20 NOTE — H&P (Signed)
History and Physical    Walter Baxter XTG:[id number] DOB: Feb 17, 1941 DOA: 10/20/2019  PCP: Lupita Raider, MD  Patient coming from: Home  I have personally briefly reviewed patient's old medical records in [facility]  Chief Complaint: Shortness of breath  HPI: Walter Baxter is a 79 y.o. male with medical history significant for diet-controlled diabetes, hypertension, and hyperlipidemia who presents to the ED for evaluation of shortness of breath.  Patient reports new sudden onset of significant generalized weakness, fatigue, and shortness of breath beginning the night before admission.  He has had associated intermittent fevers, chills, loss of taste/smell, and rare nonproductive cough.  He denies any nausea, vomiting, abdominal pain, diarrhea, or dysuria.  He says his wife is currently admitted for Covid 19 infection as well.  ED Course:  Initial vitals showed BP 165/64, pulse 94, RR 32, temp 100.5 Fahrenheit, SPO2 95% on 6 L supplemental O2 via [place].  Per report, patient desaturated to 86% on room air.  Labs are notable for WBC 9.8, hemoglobin 15.2, platelets 243,000, BUN 21, creatinine 1.33, sodium 135, potassium 3.7, bicarb 22, AST 46, ALT 24, procalcitonin 0.21, fibrin derivatives D-dimer 2932.23, lactic acid 3.7, LDH 363, ferritin 3396, triglycerides 249, fibrinogen >750, CRP pending.  Portable chest x-ray showed airspace opacities in the peripheral right midlung and left lower lung.  Patient was given 1 L normal saline and the hospitalist service was consulted to admit for further evaluation and management.  Review of Systems: All systems reviewed and are negative except as documented in history of present illness above.   Past Medical History:  Diagnosis Date  . Allergic rhinitis   . Angio-edema   . Chronic bronchitis (HCC)   . Erectile dysfunction   . Essential hypertension   . Hyperlipidemia   . Obesity   . Type 2 diabetes mellitus (HCC)     Past Surgical  History:  Procedure Laterality Date  . ADENOIDECTOMY    . TONSILLECTOMY      Social History:  reports that he has never smoked. He has never used smokeless tobacco. He reports previous alcohol use. He reports that he does not use drugs.  Allergies  Allergen Reactions  . Other Rash    Magic mouth wash     Family History  Problem Relation Age of Onset  . Lupus Sister      Prior to Admission medications   Medication Sig Start Date End Date Taking? Authorizing Provider  cetirizine (ZYRTEC) 10 MG tablet Take 10 mg by mouth daily.    [provider]  famotidine (PEPCID) 20 MG tablet Take 1 tablet (20 mg total) by mouth 2 (two) times daily. 08/16/19   Marcelyn Bruins, MD  hydrOXYzine (ATARAX/VISTARIL) 25 MG tablet Take 1 tablet by mouth as needed at bedtime Patient not taking: Reported on 07/21/2019 03/18/19   Marcelyn Bruins, MD  metoprolol tartrate (LOPRESSOR) 50 MG tablet TK 1 T PO BID 12/01/18   [provider]  mometasone (ELOCON) 0.1 % ointment Apply thin layer to skin once daily as needed 03/18/19   Marcelyn Bruins, MD  montelukast (SINGULAIR) 10 MG tablet Take 1 tablet (10 mg total) by mouth at bedtime. 08/16/19   Marcelyn Bruins, MD  pravastatin (PRAVACHOL) 40 MG tablet TK 1 T PO QD 03/09/19   [provider]    Physical Exam: Vitals:   10/20/19 1727 10/20/19 1759 10/20/19 1815 10/20/19 1845  BP: (!) 165/64  (!) 143/62 136/68  Pulse: 100  94 93 89  Resp: (!) 32  (!) 37 (!) 33  Temp: (!) 100.5 F (38.1 C)     TempSrc: Oral     SpO2: 94% 95% 95% 96%  Weight:      Height:       Constitutional: Obese man resting supine in bed, NAD, calm, appears tired but comfortable Eyes: PERRL, lids and conjunctivae normal ENMT: Mucous membranes are dry. Posterior pharynx clear of any exudate or lesions.Normal dentition.  Neck: normal, supple, no masses. Respiratory: clear to auscultation bilaterally, no wheezing, no  crackles. Normal respiratory effort. No accessory muscle use.  Cardiovascular: Regular rate and rhythm, no murmurs / rubs / gallops. No extremity edema. 2+ pedal pulses. Abdomen: no tenderness, no masses palpated. No hepatosplenomegaly. Bowel sounds positive.  Musculoskeletal: no clubbing / cyanosis. No joint deformity upper and lower extremities. Good ROM, no contractures. Normal muscle tone.  Skin: Diaphoretic, no rashes, lesions, ulcers. No induration Neurologic: CN 2-12 grossly intact. Sensation intact, Strength 5/5 in all 4.  Psychiatric: Normal judgment and insight. Alert and oriented x 3. Normal mood.   Labs on Admission: I have personally reviewed following labs and imaging studies  CBC: Recent Labs  Lab 10/20/19 1730  WBC 9.8  NEUTROABS 8.7*  HGB 15.2  HCT 45.2  MCV 86.9  PLT 703   Basic Metabolic Panel: Recent Labs  Lab 10/20/19 1730  NA 135  K 3.7  CL 100  CO2 22  GLUCOSE 141*  BUN 21  CREATININE 1.33*  CALCIUM 7.8*   GFR: Estimated Creatinine Clearance: 54.7 mL/min (A) (by C-G formula based on SCr of 1.33 mg/dL (H)). Liver Function Tests: Recent Labs  Lab 10/20/19 1730  AST 46*  ALT 24  ALKPHOS 52  BILITOT 1.2  PROT 6.8  ALBUMIN 2.7*   No results for input(s): LIPASE, AMYLASE in the last 168 hours. No results for input(s): AMMONIA in the last 168 hours. Coagulation Profile: No results for input(s): INR, PROTIME in the last 168 hours. Cardiac Enzymes: No results for input(s): CKTOTAL, CKMB, CKMBINDEX, TROPONINI in the last 168 hours. BNP (last 3 results) No results for input(s): PROBNP in the last 8760 hours. HbA1C: No results for input(s): HGBA1C in the last 72 hours. CBG: No results for input(s): GLUCAP in the last 168 hours. Lipid Profile: Recent Labs    10/20/19 1730  TRIG 249*   Thyroid Function Tests: No results for input(s): TSH, T4TOTAL, FREET4, T3FREE, THYROIDAB in the last 72 hours. Anemia Panel: Recent Labs    10/20/19 1730    FERRITIN 3,396*   Urine analysis: No results found for: COLORURINE, APPEARANCEUR, LABSPEC, PHURINE, GLUCOSEU, HGBUR, BILIRUBINUR, KETONESUR, PROTEINUR, UROBILINOGEN, NITRITE, LEUKOCYTESUR  Radiological Exams on Admission: DG Chest Port 1 View  Result Date: 10/20/2019 CLINICAL DATA:  Shortness of breath, COVID exposure. EXAM: PORTABLE CHEST 1 VIEW COMPARISON:  Chest radiograph dated 02/10/2011. FINDINGS: The heart appears enlarged. Vascular calcifications are seen in the aortic arch. Moderate airspace opacities are seen in the peripheral right mid lung and in the left lower lung. There is no pleural effusion or pneumothorax. The osseous structures are intact. IMPRESSION: Moderate airspace opacities in the peripheral right mid lung and left lower lung may reflect COVID-19 pneumonia. Electronically Signed   By: Zerita Boers M.D.   On: 10/20/2019 18:11    EKG: Independently reviewed. Sinus tachycardia, rate 102, PACs, RBBB, wandering lead.  Rate is faster when compared to prior EKG from 2012.  Assessment/Plan Principal Problem:   Acute hypoxemic  respiratory failure due to COVID-19 [facility]) Active Problems:   Essential hypertension   Hyperlipidemia   Type 2 diabetes mellitus (HCC)  Walter Baxter is a 79 y.o. male with medical history significant for diet-controlled diabetes, hypertension, and hyperlipidemia who is admitted with acute respiratory failure with hypoxia due to COVID-19 viral pneumonia.  Acute hypoxic respiratory failure due to COVID-19 viral pneumonia: SARS-CoV-2 PCR positive 10/20/2019.  Patient has increased oxygen requirement and infiltrates on chest x-ray. -Start IV remdesivir per pharmacy protocol -Start IV dexamethasone 6 mg daily -Continue supplemental oxygen and wean down as able -Incentive spirometer, flutter valve, Combivent -He has needed antitussives, vitamin C, zinc  Diarrhea: Type 7 stool noted.  Will check C. difficile PCR.  Hypertension: Currently stable  continue home Lopressor.  Hyperlipidemia: Patient states he is not currently on statin therapy.  Type 2 diabetes: History of type 2 diabetes listed in chart, patient denies any known history.  Check A1c.  DVT prophylaxis: Lovenox Code Status: Full code, confirmed with patient Family Communication: Discussed with patient Disposition Plan: Pending clinical progress Consults called: None Admission status: Inpatient for management of acute respiratory failure with hypoxia due to COVID-19 viral pneumonia.   Darreld Mclean MD [facility]  If 7PM-7AM, please contact night-coverage [email or web address]  10/20/2019, 7:24 PM

## 2019-10-21 ENCOUNTER — Encounter: Payer: Self-pay | Admitting: Internal Medicine

## 2019-10-21 DIAGNOSIS — U071 COVID-19: Principal | ICD-10-CM

## 2019-10-21 DIAGNOSIS — J9601 Acute respiratory failure with hypoxia: Secondary | ICD-10-CM

## 2019-10-21 LAB — COMPREHENSIVE METABOLIC PANEL
ALT: 19 U/L (ref 0–44)
AST: 32 U/L (ref 15–41)
Albumin: 2.3 g/dL — ABNORMAL LOW (ref 3.5–5.0)
Alkaline Phosphatase: 48 U/L (ref 38–126)
Anion gap: 10 (ref 5–15)
BUN: 24 mg/dL — ABNORMAL HIGH (ref 8–23)
CO2: 20 mmol/L — ABNORMAL LOW (ref 22–32)
Calcium: 7.3 mg/dL — ABNORMAL LOW (ref 8.9–10.3)
Chloride: 107 mmol/L (ref 98–111)
Creatinine, Ser: 0.81 mg/dL (ref 0.61–1.24)
GFR calc Af Amer: >60 mL/min (ref >60)
GFR calc non Af Amer: >60 mL/min (ref >60)
Glucose, Bld: 178 mg/dL — ABNORMAL HIGH (ref 70–99)
Potassium: 3.4 mmol/L — ABNORMAL LOW (ref 3.5–5.1)
Sodium: 137 mmol/L (ref 135–145)
Total Bilirubin: 0.8 mg/dL (ref 0.3–1.2)
Total Protein: 5.9 g/dL — ABNORMAL LOW (ref 6.5–8.1)

## 2019-10-21 LAB — CBC WITH DIFFERENTIAL/PLATELET
Abs Immature Granulocytes: 0.05 10*3/uL (ref 0.00–0.07)
Basophils Absolute: 0 10*3/uL (ref 0.0–0.1)
Basophils Relative: 0 %
Eosinophils Absolute: 0 10*3/uL (ref 0.0–0.5)
Eosinophils Relative: 0 %
HCT: 38.4 % — ABNORMAL LOW (ref 39.0–52.0)
Hemoglobin: 13 g/dL (ref 13.0–17.0)
Immature Granulocytes: 1 %
Lymphocytes Relative: 6 %
Lymphs Abs: 0.4 10*3/uL — ABNORMAL LOW (ref 0.7–4.0)
MCH: 29.1 pg (ref 26.0–34.0)
MCHC: 33.9 g/dL (ref 30.0–36.0)
MCV: 85.9 fL (ref 80.0–100.0)
Monocytes Absolute: 0.2 10*3/uL (ref 0.1–1.0)
Monocytes Relative: 4 %
Neutro Abs: 5.8 10*3/uL (ref 1.7–7.7)
Neutrophils Relative %: 89 %
Platelets: 221 10*3/uL (ref 150–400)
RBC: 4.47 MIL/uL (ref 4.22–5.81)
RDW: 13.1 % (ref 11.5–15.5)
WBC: 6.4 10*3/uL (ref 4.0–10.5)
nRBC: 0 % (ref 0.0–0.2)

## 2019-10-21 LAB — MAGNESIUM: Magnesium: 2.2 mg/dL (ref 1.7–2.4)

## 2019-10-21 LAB — HEMOGLOBIN A1C
Hgb A1c MFr Bld: 6.1 % — ABNORMAL HIGH (ref 4.8–5.6)
Mean Plasma Glucose: 128.37 mg/dL

## 2019-10-21 LAB — C-REACTIVE PROTEIN: CRP: 20.9 mg/dL — ABNORMAL HIGH (ref <1.0)

## 2019-10-21 LAB — FERRITIN: Ferritin: 2596 ng/mL — ABNORMAL HIGH (ref 24–336)

## 2019-10-21 LAB — FIBRIN DERIVATIVES D-DIMER (ARMC ONLY): Fibrin derivatives D-dimer (ARMC): 2388.43 ng/mL (FEU) — ABNORMAL HIGH (ref 0.00–499.00)

## 2019-10-21 LAB — PHOSPHORUS: Phosphorus: 2.5 mg/dL (ref 2.5–4.6)

## 2019-10-21 NOTE — Progress Notes (Signed)
Patient reported that he needed to void but didn't feel like he could. Up to bedside commode with very small amount of urine mixed with smear of stool. Bladder scan 540cc. Order for in and out cath from Dr. Allena Katz q8h PRN.

## 2019-10-21 NOTE — Progress Notes (Signed)
[facility] Progress Note  Patient: Walter Baxter    ALP:[id number]  DOA: 10/20/2019     Date of Service: the patient was seen and examined on 10/21/2019  Chief Complaint  Patient presents with  . Shortness of Breath  . Weakness   Brief hospital course: Diet-controlled diabetes, HTN, HLD.  Patient presented with shortness of breath found to have acute hypoxic respiratory failure due to COVID-19 pneumonia Currently further plan is continue current treatment.  Assessment and Plan: 1.  Acute hypoxic failure secondary to COVID-19 pneumonia. RT-PCR positive. Currently on oxygen. On IV remdesivir. On IV Decadron. On vitamin C and zinc. Continue inhalers. CRP significantly elevated.  Rule out infection with C. difficile before considering Actemra.  Patient has consented for Actemra  2.  Essential hypertension Continue Lopressor  3.  Hyperlipidemia Not on a statin for now. Monitor.  4.  Type 2 diabetes mellitus.  Uncontrolled with hyperglycemia Hyperglycemia due to steroids. Continue to monitor.  On sliding scale insulin.  5.  Metabolic acidosis Acute kidney injury Renal function significantly better.  Monitor.  Diet: Carb modified diet DVT Prophylaxis: Subcutaneous Lovenox   Advance goals of care discussion: Full code  Family Communication: no family was present at bedside, at the time of interview.   Disposition:  Pt is from home, admitted with COVID-19 pneumonia, still has hypoxia, which precludes a safe discharge. Discharge to home, when medically stable.  Subjective: Continues to have fatigue and tiredness.  No nausea no vomiting.  No fever no chills.  Reports shortness of breath and cough  Physical Exam: General: alert oriented to time, place, and person.  Appear in mild distress, affect appropriate Eyes: PERRL ENT: Oral Mucosa Clear, moist  Neck: no JVD,  Cardiovascular: S1 and S2 Present, no Murmur,  Respiratory: good respiratory effort, Bilateral  Air entry equal and Decreased, bilateral  Crackles, no wheezes Abdomen: Bowel Sound present, Soft and no tenderness,  Skin: no rash Extremities: no Pedal edema, no calf tenderness Neurologic: without any new focal findings Gait not checked due to patient safety concerns  Vitals:   10/20/19 2331 10/21/19 0418 10/21/19 0845 10/21/19 1603  BP: (!) 145/76 133/68 136/72 139/66  Pulse: 84 72 70 70  Resp: 19 19 19 19   Temp: 98.2 F (36.8 C) (!) 97.4 F (36.3 C) 98.3 F (36.8 C) (!) 97.3 F (36.3 C)  TempSrc: Oral Oral  Oral  SpO2: 97% 93% 94% 93%  Weight: 99.9 kg     Height: 5\' 8"  (1.727 m)       Intake/Output Summary (Last 24 hours) at 10/21/2019 1718 Last data filed at 10/21/2019 1514 Gross per 24 hour  Intake 1353 ml  Output 150 ml  Net 1203 ml   Filed Weights   10/20/19 1724 10/20/19 2331  Weight: 105.2 kg 99.9 kg    Data Reviewed: I have personally reviewed and interpreted daily labs, tele strips, imagings as discussed above. I reviewed all nursing notes, pharmacy notes, vitals, pertinent old records I have discussed plan of care as described above with RN and patient/family.  CBC: Recent Labs  Lab 10/20/19 1730 10/21/19 0637  WBC 9.8 6.4  NEUTROABS 8.7* 5.8  HGB 15.2 13.0  HCT 45.2 38.4*  MCV 86.9 85.9  PLT 243 353   Basic Metabolic Panel: Recent Labs  Lab 10/20/19 1730 10/21/19 0637  NA 135 137  K 3.7 3.4*  CL 100 107  CO2 22 20*  GLUCOSE 141* 178*  BUN 21 24*  CREATININE 1.33*  0.81  CALCIUM 7.8* 7.3*  MG  --  2.2  PHOS  --  2.5    Studies: DG Chest Port 1 View  Result Date: 10/20/2019 CLINICAL DATA:  Shortness of breath, COVID exposure. EXAM: PORTABLE CHEST 1 VIEW COMPARISON:  Chest radiograph dated 02/10/2011. FINDINGS: The heart appears enlarged. Vascular calcifications are seen in the aortic arch. Moderate airspace opacities are seen in the peripheral right mid lung and in the left lower lung. There is no pleural effusion or pneumothorax. The  osseous structures are intact. IMPRESSION: Moderate airspace opacities in the peripheral right mid lung and left lower lung may reflect COVID-19 pneumonia. Electronically Signed   By: Romona Curls M.D.   On: 10/20/2019 18:11    Scheduled Meds: . vitamin C  500 mg Oral Daily  . dexamethasone (DECADRON) injection  6 mg Intravenous Q24H  . enoxaparin (LOVENOX) injection  40 mg Subcutaneous Q24H  . famotidine  20 mg Oral BID  . Ipratropium-Albuterol  1 puff Inhalation Q6H  . metoprolol tartrate  50 mg Oral BID  . sodium chloride flush  3 mL Intravenous Q12H  . zinc sulfate  220 mg Oral Daily   Continuous Infusions: . remdesivir 100 mg in NS 100 mL 100 mg (10/21/19 0941)   PRN Meds: acetaminophen, chlorpheniramine-HYDROcodone, guaiFENesin-dextromethorphan, ondansetron **OR** ondansetron (ZOFRAN) IV  Time spent: 35 minutes  Author: Lynden Oxford, MD [facility] 10/21/2019 5:18 PM  To reach On-call, see care teams to locate the attending and reach out to them via [email or web address]. If 7PM-7AM, please contact night-coverage If you still have difficulty reaching the attending provider, please page the [facility] (Director on Call) for [facility] on amion for assistance.

## 2019-10-22 LAB — FIBRIN DERIVATIVES D-DIMER (ARMC ONLY): Fibrin derivatives D-dimer (ARMC): 2478.27 ng/mL (FEU) — ABNORMAL HIGH (ref 0.00–499.00)

## 2019-10-22 LAB — CBC WITH DIFFERENTIAL/PLATELET
Abs Immature Granulocytes: 0.37 10*3/uL — ABNORMAL HIGH (ref 0.00–0.07)
Basophils Absolute: 0 10*3/uL (ref 0.0–0.1)
Basophils Relative: 0 %
Eosinophils Absolute: 0 10*3/uL (ref 0.0–0.5)
Eosinophils Relative: 0 %
HCT: 41.7 % (ref 39.0–52.0)
Hemoglobin: 14.1 g/dL (ref 13.0–17.0)
Immature Granulocytes: 3 %
Lymphocytes Relative: 4 %
Lymphs Abs: 0.6 10*3/uL — ABNORMAL LOW (ref 0.7–4.0)
MCH: 28.7 pg (ref 26.0–34.0)
MCHC: 33.8 g/dL (ref 30.0–36.0)
MCV: 84.9 fL (ref 80.0–100.0)
Monocytes Absolute: 0.4 10*3/uL (ref 0.1–1.0)
Monocytes Relative: 3 %
Neutro Abs: 11.7 10*3/uL — ABNORMAL HIGH (ref 1.7–7.7)
Neutrophils Relative %: 90 %
Platelets: 302 10*3/uL (ref 150–400)
RBC: 4.91 MIL/uL (ref 4.22–5.81)
RDW: 13.2 % (ref 11.5–15.5)
WBC: 13 10*3/uL — ABNORMAL HIGH (ref 4.0–10.5)
nRBC: 0 % (ref 0.0–0.2)

## 2019-10-22 LAB — COMPREHENSIVE METABOLIC PANEL
ALT: 19 U/L (ref 0–44)
AST: 30 U/L (ref 15–41)
Albumin: 2.3 g/dL — ABNORMAL LOW (ref 3.5–5.0)
Alkaline Phosphatase: 52 U/L (ref 38–126)
Anion gap: 11 (ref 5–15)
BUN: 26 mg/dL — ABNORMAL HIGH (ref 8–23)
CO2: 22 mmol/L (ref 22–32)
Calcium: 7.7 mg/dL — ABNORMAL LOW (ref 8.9–10.3)
Chloride: 107 mmol/L (ref 98–111)
Creatinine, Ser: 0.92 mg/dL (ref 0.61–1.24)
GFR calc Af Amer: >60 mL/min (ref >60)
GFR calc non Af Amer: >60 mL/min (ref >60)
Glucose, Bld: 202 mg/dL — ABNORMAL HIGH (ref 70–99)
Potassium: 3.6 mmol/L (ref 3.5–5.1)
Sodium: 140 mmol/L (ref 135–145)
Total Bilirubin: 0.8 mg/dL (ref 0.3–1.2)
Total Protein: 6.1 g/dL — ABNORMAL LOW (ref 6.5–8.1)

## 2019-10-22 LAB — GLUCOSE, CAPILLARY
Glucose-Capillary: 201 mg/dL — ABNORMAL HIGH (ref 70–99)
Glucose-Capillary: 262 mg/dL — ABNORMAL HIGH (ref 70–99)

## 2019-10-22 LAB — MAGNESIUM: Magnesium: 2.1 mg/dL (ref 1.7–2.4)

## 2019-10-22 LAB — FERRITIN: Ferritin: 2618 ng/mL — ABNORMAL HIGH (ref 24–336)

## 2019-10-22 LAB — C-REACTIVE PROTEIN: CRP: 14.4 mg/dL — ABNORMAL HIGH (ref <1.0)

## 2019-10-22 LAB — PHOSPHORUS: Phosphorus: 3 mg/dL (ref 2.5–4.6)

## 2019-10-22 MED ORDER — CHLORHEXIDINE GLUCONATE CLOTH 2 % EX PADS
6.0000 | MEDICATED_PAD | Freq: Every day | CUTANEOUS | Status: DC
  Administered 2019-10-22 – 2019-10-24 (×3): 6 via TOPICAL

## 2019-10-22 MED ORDER — TOCILIZUMAB 400 MG/20ML IV SOLN
800.0000 mg | Freq: Once | INTRAVENOUS | Status: AC
  Administered 2019-10-22: 800 mg via INTRAVENOUS
  Filled 2019-10-22: qty 40

## 2019-10-22 MED ORDER — INSULIN ASPART 100 UNIT/ML ~~LOC~~ SOLN
0.0000 [IU] | Freq: Every day | SUBCUTANEOUS | Status: DC
  Administered 2019-10-22 – 2019-10-31 (×5): 3 [IU] via SUBCUTANEOUS
  Filled 2019-10-22 (×5): qty 1

## 2019-10-22 MED ORDER — POTASSIUM CHLORIDE CRYS ER 20 MEQ PO TBCR
40.0000 meq | EXTENDED_RELEASE_TABLET | Freq: Once | ORAL | Status: AC
  Administered 2019-10-22: 40 meq via ORAL
  Filled 2019-10-22: qty 2

## 2019-10-22 MED ORDER — TAMSULOSIN HCL 0.4 MG PO CAPS
0.4000 mg | ORAL_CAPSULE | Freq: Every day | ORAL | Status: DC
  Administered 2019-10-22 – 2019-11-05 (×15): 0.4 mg via ORAL
  Filled 2019-10-22 (×15): qty 1

## 2019-10-22 MED ORDER — INSULIN ASPART 100 UNIT/ML ~~LOC~~ SOLN
0.0000 [IU] | Freq: Three times a day (TID) | SUBCUTANEOUS | Status: DC
  Administered 2019-10-22: 3 [IU] via SUBCUTANEOUS
  Administered 2019-10-23: 7 [IU] via SUBCUTANEOUS
  Administered 2019-10-23: 2 [IU] via SUBCUTANEOUS
  Administered 2019-10-24: 1 [IU] via SUBCUTANEOUS
  Administered 2019-10-24 – 2019-10-26 (×5): 2 [IU] via SUBCUTANEOUS
  Administered 2019-10-26: 1 [IU] via SUBCUTANEOUS
  Administered 2019-10-26 – 2019-10-27 (×2): 3 [IU] via SUBCUTANEOUS
  Administered 2019-10-27: 17:00:00 1 [IU] via SUBCUTANEOUS
  Administered 2019-10-27: 2 [IU] via SUBCUTANEOUS
  Administered 2019-10-28: 3 [IU] via SUBCUTANEOUS
  Administered 2019-10-28 – 2019-10-29 (×3): 2 [IU] via SUBCUTANEOUS
  Administered 2019-10-29 – 2019-10-30 (×4): 3 [IU] via SUBCUTANEOUS
  Administered 2019-10-30 – 2019-10-31 (×3): 5 [IU] via SUBCUTANEOUS
  Administered 2019-10-31 – 2019-11-01 (×2): 3 [IU] via SUBCUTANEOUS
  Administered 2019-11-01: 17:00:00 2 [IU] via SUBCUTANEOUS
  Administered 2019-11-01 – 2019-11-03 (×7): 3 [IU] via SUBCUTANEOUS
  Administered 2019-11-04: 5 [IU] via SUBCUTANEOUS
  Administered 2019-11-04: 3 [IU] via SUBCUTANEOUS
  Administered 2019-11-04 – 2019-11-05 (×2): 2 [IU] via SUBCUTANEOUS
  Administered 2019-11-05: 3 [IU] via SUBCUTANEOUS
  Filled 2019-10-22 (×40): qty 1

## 2019-10-22 NOTE — Progress Notes (Signed)
Patient retaining urine after multiple attempts to urinate. Straight cath volume was at 11:59. Bladder scan volume was at 16:57. MD notified. Foley order placed and inserted. Patient educated and verbalized understanding.

## 2019-10-22 NOTE — Progress Notes (Addendum)
[facility] Progress Note  Patient: Walter Baxter    YPP:[id number]  DOA: 10/20/2019     Date of Service: the patient was seen and examined on 10/22/2019  Chief Complaint  Patient presents with  . Shortness of Breath  . Weakness   Brief hospital course: Diet-controlled diabetes, HTN, HLD.  Patient presented with shortness of breath found to have acute hypoxic respiratory failure due to COVID-19 pneumonia Currently further plan is continue current treatment.  Assessment and Plan: 1.  Acute hypoxic failure secondary to COVID-19 pneumonia. RT-PCR positive. Currently on oxygen. On IV remdesivir. On IV Decadron. On vitamin C and zinc. Continue inhalers. CRP significantly elevated.   Pharmacy consulted for Actemra.  Patient consented for Actemra.  X1 on 10/22/2019.  2.  Essential hypertension Continue Lopressor  3.  Hyperlipidemia Not on a statin for now. Monitor.  4.  Type 2 diabetes mellitus.  Uncontrolled with hyperglycemia Hyperglycemia due to steroids. Continue to monitor.  On sliding scale insulin.  5.  Metabolic acidosis Acute kidney injury Renal function significantly better.  Monitor.  6.  Acute urinary retention. Etiology not clear likely from deconditioning. Undiagnosed BPH. In and out x4 did not improve the retention. We will continue with Flomax. Foley catheter. Outpatient follow-up with urology.  Diet: Carb modified diet DVT Prophylaxis: Subcutaneous Lovenox   Advance goals of care discussion: Full code  Family Communication: no family was present at bedside, at the time of interview.   Disposition:  Pt is from home, admitted with COVID-19 pneumonia, still has hypoxia, which precludes a safe discharge. Discharge to home, when medically stable.  Subjective: Continues to have shortness of breath fatigue. Denies any significant change in his condition from yesterday.  Physical Exam: General: alert and oriented to time, place, and person.  Appear in mild distress, affect appropriate Eyes: PERRL, Conjunctiva normal ENT: Oral Mucosa Clear, moist  Neck: no JVD, no Abnormal Mass Or lumps Cardiovascular: S1 and S2 Present, no Murmur, peripheral pulses symmetrical Respiratory: increased respiratory effort, Bilateral Air entry equal and Decreased, no signs of accessory muscle use, bilateral  Crackles, no wheezes Abdomen: Bowel Sound present, Soft and no tenderness, no hernia Skin: no rashes  Extremities: no Pedal edema, no calf tenderness Neurologic: without any new focal findings Gait not checked due to patient safety concerns   Vitals:   10/21/19 2215 10/22/19 0532 10/22/19 0833 10/22/19 1503  BP: 128/63  (!) 145/67 117/64  Pulse: 72  70 72  Resp:   18 18  Temp:   98.1 F (36.7 C) 98.4 F (36.9 C)  TempSrc:    Oral  SpO2: 94%  94% 91%  Weight:  98.7 kg    Height:        Intake/Output Summary (Last 24 hours) at 10/22/2019 1829 Last data filed at 10/22/2019 1759 Gross per 24 hour  Intake 3 ml  Output 1375 ml  Net -1372 ml   Filed Weights   10/20/19 1724 10/20/19 2331 10/22/19 0532  Weight: 105.2 kg 99.9 kg 98.7 kg    Data Reviewed: I have personally reviewed and interpreted daily labs, tele strips, imagings as discussed above. I reviewed all nursing notes, pharmacy notes, vitals, pertinent old records I have discussed plan of care as described above with RN and patient/family.  CBC: Recent Labs  Lab 10/20/19 1730 10/21/19 0637 10/22/19 0734  WBC 9.8 6.4 13.0*  NEUTROABS 8.7* 5.8 11.7*  HGB 15.2 13.0 14.1  HCT 45.2 38.4* 41.7  MCV 86.9 85.9 84.9  PLT 243 221 462   Basic Metabolic Panel: Recent Labs  Lab 10/20/19 1730 10/21/19 0637 10/22/19 0734  NA 135 137 140  K 3.7 3.4* 3.6  CL 100 107 107  CO2 22 20* 22  GLUCOSE 141* 178* 202*  BUN 21 24* 26*  CREATININE 1.33* 0.81 0.92  CALCIUM 7.8* 7.3* 7.7*  MG  --  2.2 2.1  PHOS  --  2.5 3.0    Studies: No results found.  Scheduled Meds: .  vitamin C  500 mg Oral Daily  . Chlorhexidine Gluconate Cloth  6 each Topical Daily  . dexamethasone (DECADRON) injection  6 mg Intravenous Q24H  . enoxaparin (LOVENOX) injection  40 mg Subcutaneous Q24H  . famotidine  20 mg Oral BID  . insulin aspart  0-5 Units Subcutaneous QHS  . insulin aspart  0-9 Units Subcutaneous TID WC  . Ipratropium-Albuterol  1 puff Inhalation Q6H  . metoprolol tartrate  50 mg Oral BID  . sodium chloride flush  3 mL Intravenous Q12H  . tamsulosin  0.4 mg Oral Daily  . zinc sulfate  220 mg Oral Daily   Continuous Infusions: . remdesivir 100 mg in NS 100 mL 100 mg (10/22/19 0924)   PRN Meds: acetaminophen, chlorpheniramine-HYDROcodone, guaiFENesin-dextromethorphan, ondansetron **OR** ondansetron (ZOFRAN) IV  Time spent: 35 minutes  Author: Berle Mull, MD [facility] 10/22/2019 6:29 PM  To reach On-call, see care teams to locate the attending and reach out to them via [email or web address]. If 7PM-7AM, please contact night-coverage If you still have difficulty reaching the attending provider, please page the [facility] (Director on Call) for [facility] on amion for assistance.

## 2019-10-23 LAB — CBC WITH DIFFERENTIAL/PLATELET
Abs Immature Granulocytes: 0.19 10*3/uL — ABNORMAL HIGH (ref 0.00–0.07)
Basophils Absolute: 0 10*3/uL (ref 0.0–0.1)
Basophils Relative: 0 %
Eosinophils Absolute: 0 10*3/uL (ref 0.0–0.5)
Eosinophils Relative: 0 %
HCT: 41.2 % (ref 39.0–52.0)
Hemoglobin: 14.1 g/dL (ref 13.0–17.0)
Immature Granulocytes: 1 %
Lymphocytes Relative: 5 %
Lymphs Abs: 0.7 10*3/uL (ref 0.7–4.0)
MCH: 29.4 pg (ref 26.0–34.0)
MCHC: 34.2 g/dL (ref 30.0–36.0)
MCV: 86 fL (ref 80.0–100.0)
Monocytes Absolute: 0.4 10*3/uL (ref 0.1–1.0)
Monocytes Relative: 3 %
Neutro Abs: 12.5 10*3/uL — ABNORMAL HIGH (ref 1.7–7.7)
Neutrophils Relative %: 91 %
Platelets: 318 10*3/uL (ref 150–400)
RBC: 4.79 MIL/uL (ref 4.22–5.81)
RDW: 13.6 % (ref 11.5–15.5)
WBC: 13.8 10*3/uL — ABNORMAL HIGH (ref 4.0–10.5)
nRBC: 0 % (ref 0.0–0.2)

## 2019-10-23 LAB — COMPREHENSIVE METABOLIC PANEL
ALT: 24 U/L (ref 0–44)
AST: 37 U/L (ref 15–41)
Albumin: 2.3 g/dL — ABNORMAL LOW (ref 3.5–5.0)
Alkaline Phosphatase: 55 U/L (ref 38–126)
Anion gap: 9 (ref 5–15)
BUN: 26 mg/dL — ABNORMAL HIGH (ref 8–23)
CO2: 22 mmol/L (ref 22–32)
Calcium: 7.8 mg/dL — ABNORMAL LOW (ref 8.9–10.3)
Chloride: 109 mmol/L (ref 98–111)
Creatinine, Ser: 0.94 mg/dL (ref 0.61–1.24)
GFR calc Af Amer: >60 mL/min (ref >60)
GFR calc non Af Amer: >60 mL/min (ref >60)
Glucose, Bld: 204 mg/dL — ABNORMAL HIGH (ref 70–99)
Potassium: 3.7 mmol/L (ref 3.5–5.1)
Sodium: 140 mmol/L (ref 135–145)
Total Bilirubin: 0.8 mg/dL (ref 0.3–1.2)
Total Protein: 6 g/dL — ABNORMAL LOW (ref 6.5–8.1)

## 2019-10-23 LAB — FIBRIN DERIVATIVES D-DIMER (ARMC ONLY): Fibrin derivatives D-dimer (ARMC): 2192.71 ng/mL (FEU) — ABNORMAL HIGH (ref 0.00–499.00)

## 2019-10-23 LAB — GLUCOSE, CAPILLARY
Glucose-Capillary: 189 mg/dL — ABNORMAL HIGH (ref 70–99)
Glucose-Capillary: 308 mg/dL — ABNORMAL HIGH (ref 70–99)
Glucose-Capillary: 188 mg/dL — ABNORMAL HIGH (ref 70–99)
Glucose-Capillary: 157 mg/dL — ABNORMAL HIGH (ref 70–99)

## 2019-10-23 LAB — C-REACTIVE PROTEIN: CRP: 7.3 mg/dL — ABNORMAL HIGH (ref <1.0)

## 2019-10-23 LAB — PHOSPHORUS: Phosphorus: 2.8 mg/dL (ref 2.5–4.6)

## 2019-10-23 LAB — MAGNESIUM: Magnesium: 2.2 mg/dL (ref 1.7–2.4)

## 2019-10-23 LAB — FERRITIN: Ferritin: 2456 ng/mL — ABNORMAL HIGH (ref 24–336)

## 2019-10-23 NOTE — Progress Notes (Signed)
[facility] Progress Note  Patient: Walter Baxter    UMP:[id number]  DOA: 10/20/2019     Date of Service: the patient was seen and examined on 10/23/2019  Chief Complaint  Patient presents with  . Shortness of Breath  . Weakness   Brief hospital course: Diet-controlled diabetes, HTN, HLD.  Patient presented with shortness of breath found to have acute hypoxic respiratory failure due to COVID-19 pneumonia Currently further plan is continue current treatment.  Assessment and Plan: 1.  Acute hypoxic failure secondary to COVID-19 pneumonia. RT-PCR positive. Currently on oxygen. On IV remdesivir. On IV Decadron. On vitamin C and zinc. Continue inhalers. CRP significantly elevated.   Pharmacy consulted for Actemra.  Patient consented for Actemra.  X1 on 10/22/2019. CRP continues to improve.  2.  Essential hypertension Continue Lopressor  3.  Hyperlipidemia Not on a statin for now. Monitor.  4.  Type 2 diabetes mellitus.  Uncontrolled with hyperglycemia Hyperglycemia due to steroids. Continue to monitor.  On sliding scale insulin.  5.  Metabolic acidosis Acute kidney injury Renal function significantly better.  Monitor.  6.  Acute urinary retention. Etiology not clear likely from deconditioning. Undiagnosed BPH. In and out x4 did not improve the retention. We will continue with Flomax. Foley catheter. Outpatient follow-up with urology.  Diet: Carb modified diet DVT Prophylaxis: Subcutaneous Lovenox   Advance goals of care discussion: Full code  Family Communication: no family was present at bedside, at the time of interview.   Disposition:  Pt is from home, admitted with COVID-19 pneumonia, still has hypoxia on 4 LPM, which precludes a safe discharge. Discharge to home, when medically stable.  Subjective: Continues to have shortness of breath and fatigue.  No nausea no vomiting.  No chest pain.  Physical Exam: General:  alert oriented to time,  place, and person.  Appear in moderate distress, affect appropriate Eyes: PERRL ENT: Oral Mucosa Clear, moist  Neck: no JVD,  Cardiovascular: S1 and S2 Present, no Murmur,  Respiratory: good respiratory effort, Bilateral Air entry equal and Decreased, bilateral  Crackles, no wheezes Abdomen: Bowel Sound present, Soft and no tenderness,  Skin: no rash Extremities: no Pedal edema, no calf tenderness Neurologic: without any new focal findings Gait not checked due to patient safety concerns  Vitals:   10/22/19 2200 10/23/19 0513 10/23/19 0843 10/23/19 1550  BP:  135/66 (!) 146/73 123/60  Pulse: 70 62 64 (!) 59  Resp:  16 19 20   Temp:  97.6 F (36.4 C)  98.6 F (37 C)  TempSrc:  Oral    SpO2:  90% 92% 92%  Weight:      Height:        Intake/Output Summary (Last 24 hours) at 10/23/2019 1915 Last data filed at 10/23/2019 1500 Gross per 24 hour  Intake 172.82 ml  Output 525 ml  Net -352.18 ml   Filed Weights   10/20/19 1724 10/20/19 2331 10/22/19 0532  Weight: 105.2 kg 99.9 kg 98.7 kg    Data Reviewed: I have personally reviewed and interpreted daily labs, tele strips, imagings as discussed above. I reviewed all nursing notes, pharmacy notes, vitals, pertinent old records I have discussed plan of care as described above with RN and patient/family.  CBC: Recent Labs  Lab 10/20/19 1730 10/21/19 0637 10/22/19 0734 10/23/19 0644  WBC 9.8 6.4 13.0* 13.8*  NEUTROABS 8.7* 5.8 11.7* 12.5*  HGB 15.2 13.0 14.1 14.1  HCT 45.2 38.4* 41.7 41.2  MCV 86.9 85.9 84.9 86.0  PLT  243 221 302 237   Basic Metabolic Panel: Recent Labs  Lab 10/20/19 1730 10/21/19 0637 10/22/19 0734 10/23/19 0644  NA 135 137 140 140  K 3.7 3.4* 3.6 3.7  CL 100 107 107 109  CO2 22 20* 22 22  GLUCOSE 141* 178* 202* 204*  BUN 21 24* 26* 26*  CREATININE 1.33* 0.81 0.92 0.94  CALCIUM 7.8* 7.3* 7.7* 7.8*  MG  --  2.2 2.1 2.2  PHOS  --  2.5 3.0 2.8    Studies: No results found.  Scheduled Meds:  . vitamin C  500 mg Oral Daily  . Chlorhexidine Gluconate Cloth  6 each Topical Daily  . dexamethasone (DECADRON) injection  6 mg Intravenous Q24H  . enoxaparin (LOVENOX) injection  40 mg Subcutaneous Q24H  . famotidine  20 mg Oral BID  . insulin aspart  0-5 Units Subcutaneous QHS  . insulin aspart  0-9 Units Subcutaneous TID WC  . Ipratropium-Albuterol  1 puff Inhalation Q6H  . metoprolol tartrate  50 mg Oral BID  . sodium chloride flush  3 mL Intravenous Q12H  . tamsulosin  0.4 mg Oral Daily  . zinc sulfate  220 mg Oral Daily   Continuous Infusions: . remdesivir 100 mg in NS 100 mL Stopped (10/23/19 1323)   PRN Meds: acetaminophen, chlorpheniramine-HYDROcodone, guaiFENesin-dextromethorphan, ondansetron **OR** ondansetron (ZOFRAN) IV  Time spent: 35 minutes  Author: Berle Mull, MD [facility] 10/23/2019 7:15 PM  To reach On-call, see care teams to locate the attending and reach out to them via [email or web address]. If 7PM-7AM, please contact night-coverage If you still have difficulty reaching the attending provider, please page the [facility] (Director on Call) for [facility] on amion for assistance.

## 2019-10-23 NOTE — Plan of Care (Signed)
Pt continues to have c/o malaise and poor bed mobility.  Tolerating Remdesevir IV well.  NAD and no complaints stated by pt.   Problem: Education: Goal: Knowledge of risk factors and measures for prevention of condition will improve Outcome: Progressing   Problem: Coping: Goal: Psychosocial and spiritual needs will be supported Outcome: Progressing   Problem: Respiratory: Goal: Will maintain a patent airway Outcome: Progressing Goal: Complications related to the disease process, condition or treatment will be avoided or minimized Outcome: Progressing   Problem: Education: Goal: Knowledge of General Education information will improve Description: Including pain rating scale, medication(s)/side effects and non-pharmacologic comfort measures Outcome: Progressing   Problem: Health Behavior/Discharge Planning: Goal: Ability to manage health-related needs will improve Outcome: Progressing   Problem: Clinical Measurements: Goal: Ability to maintain clinical measurements within normal limits will improve Outcome: Progressing Goal: Will remain free from infection Outcome: Progressing Goal: Diagnostic test results will improve Outcome: Progressing Goal: Respiratory complications will improve Outcome: Progressing Goal: Cardiovascular complication will be avoided Outcome: Progressing   Problem: Activity: Goal: Risk for activity intolerance will decrease Outcome: Progressing   Problem: Nutrition: Goal: Adequate nutrition will be maintained Outcome: Progressing   Problem: Coping: Goal: Level of anxiety will decrease Outcome: Progressing   Problem: Elimination: Goal: Will not experience complications related to bowel motility Outcome: Progressing Goal: Will not experience complications related to urinary retention Outcome: Progressing   Problem: Pain Managment: Goal: General experience of comfort will improve Outcome: Progressing   Problem: Safety: Goal: Ability to  remain free from injury will improve Outcome: Progressing   Problem: Skin Integrity: Goal: Risk for impaired skin integrity will decrease Outcome: Progressing

## 2019-10-24 ENCOUNTER — Inpatient Hospital Stay: Payer: Medicare Other

## 2019-10-24 LAB — COMPREHENSIVE METABOLIC PANEL
ALT: 40 U/L (ref 0–44)
AST: 69 U/L — ABNORMAL HIGH (ref 15–41)
Albumin: 2.1 g/dL — ABNORMAL LOW (ref 3.5–5.0)
Alkaline Phosphatase: 58 U/L (ref 38–126)
Anion gap: 8 (ref 5–15)
BUN: 25 mg/dL — ABNORMAL HIGH (ref 8–23)
CO2: 25 mmol/L (ref 22–32)
Calcium: 7.5 mg/dL — ABNORMAL LOW (ref 8.9–10.3)
Chloride: 108 mmol/L (ref 98–111)
Creatinine, Ser: 0.95 mg/dL (ref 0.61–1.24)
GFR calc Af Amer: >60 mL/min (ref >60)
GFR calc non Af Amer: >60 mL/min (ref >60)
Glucose, Bld: 142 mg/dL — ABNORMAL HIGH (ref 70–99)
Potassium: 3.3 mmol/L — ABNORMAL LOW (ref 3.5–5.1)
Sodium: 141 mmol/L (ref 135–145)
Total Bilirubin: 0.7 mg/dL (ref 0.3–1.2)
Total Protein: 5.2 g/dL — ABNORMAL LOW (ref 6.5–8.1)

## 2019-10-24 LAB — CBC WITH DIFFERENTIAL/PLATELET
Abs Immature Granulocytes: 0.29 10*3/uL — ABNORMAL HIGH (ref 0.00–0.07)
Basophils Absolute: 0 10*3/uL (ref 0.0–0.1)
Basophils Relative: 0 %
Eosinophils Absolute: 0 10*3/uL (ref 0.0–0.5)
Eosinophils Relative: 0 %
HCT: 39.2 % (ref 39.0–52.0)
Hemoglobin: 13.5 g/dL (ref 13.0–17.0)
Immature Granulocytes: 2 %
Lymphocytes Relative: 10 %
Lymphs Abs: 1.3 10*3/uL (ref 0.7–4.0)
MCH: 29.3 pg (ref 26.0–34.0)
MCHC: 34.4 g/dL (ref 30.0–36.0)
MCV: 85.2 fL (ref 80.0–100.0)
Monocytes Absolute: 0.5 10*3/uL (ref 0.1–1.0)
Monocytes Relative: 4 %
Neutro Abs: 10.6 10*3/uL — ABNORMAL HIGH (ref 1.7–7.7)
Neutrophils Relative %: 84 %
Platelets: 310 10*3/uL (ref 150–400)
RBC: 4.6 MIL/uL (ref 4.22–5.81)
RDW: 13.6 % (ref 11.5–15.5)
WBC: 12.8 10*3/uL — ABNORMAL HIGH (ref 4.0–10.5)
nRBC: 0 % (ref 0.0–0.2)

## 2019-10-24 LAB — FIBRIN DERIVATIVES D-DIMER (ARMC ONLY): Fibrin derivatives D-dimer (ARMC): 2914.41 ng/mL (FEU) — ABNORMAL HIGH (ref 0.00–499.00)

## 2019-10-24 LAB — GLUCOSE, CAPILLARY
Glucose-Capillary: 117 mg/dL — ABNORMAL HIGH (ref 70–99)
Glucose-Capillary: 165 mg/dL — ABNORMAL HIGH (ref 70–99)
Glucose-Capillary: 128 mg/dL — ABNORMAL HIGH (ref 70–99)
Glucose-Capillary: 116 mg/dL — ABNORMAL HIGH (ref 70–99)

## 2019-10-24 LAB — PHOSPHORUS: Phosphorus: 2.9 mg/dL (ref 2.5–4.6)

## 2019-10-24 LAB — FERRITIN: Ferritin: 2223 ng/mL — ABNORMAL HIGH (ref 24–336)

## 2019-10-24 LAB — C-REACTIVE PROTEIN: CRP: 4.1 mg/dL — ABNORMAL HIGH (ref <1.0)

## 2019-10-24 LAB — MAGNESIUM: Magnesium: 2.1 mg/dL (ref 1.7–2.4)

## 2019-10-24 MED ORDER — SODIUM CHLORIDE 0.9 % IV SOLN
INTRAVENOUS | Status: DC | PRN
  Administered 2019-10-24: 75 mL via INTRAVENOUS

## 2019-10-24 MED ORDER — POTASSIUM CHLORIDE CRYS ER 20 MEQ PO TBCR
40.0000 meq | EXTENDED_RELEASE_TABLET | Freq: Once | ORAL | Status: AC
  Administered 2019-10-24: 13:00:00 40 meq via ORAL
  Filled 2019-10-24: qty 2

## 2019-10-24 MED ORDER — POTASSIUM CHLORIDE CRYS ER 20 MEQ PO TBCR
40.0000 meq | EXTENDED_RELEASE_TABLET | Freq: Once | ORAL | Status: AC
  Administered 2019-10-24: 10:00:00 40 meq via ORAL
  Filled 2019-10-24: qty 2

## 2019-10-24 MED ORDER — FUROSEMIDE 10 MG/ML IJ SOLN
20.0000 mg | Freq: Once | INTRAMUSCULAR | Status: AC
  Administered 2019-10-24: 20 mg via INTRAVENOUS
  Filled 2019-10-24: qty 2

## 2019-10-24 NOTE — Evaluation (Signed)
Physical Therapy Evaluation Patient Details Name: Walter Baxter MRN: [id number] DOB: 1941/06/15 Today's Date: 10/24/2019   History of Present Illness  presented to ER secondary to SOB, generalized weakness; admitted for management of acute respiratory failure related to COVID-19 PNA.  Clinical Impression  Upon evaluation, patient alert and oriented; follows commands and eager for OOB activities.  Generally weak and deconditioned due to acute illness, but no focal weakness appreciated.  Able to complete bed mobility with min assist; sit/stand, basic transfers and gait (5') with RW, min assist.  Demonstrates short, shuffling steps with poor step height/length (L > R); min assist for balance.  Mod reliance on RW, limited balance, limited functional endurance evident.  Desat to 83-84% with exertion on 4L with minimal exertion; 84-85% with seated rest and pursed lip breathing.  Unable to recover >85-86% despite increase to 6L (within 5 minutes). RN informed/aware and to monitor patient during extended recovery period.  Will continue to assess/progress as appropriate. Would benefit from skilled PT to address above deficits and promote optimal return to PLOF.; Recommend transition to HHPT upon discharge from acute hospitalization.     Follow Up Recommendations Home health PT    Equipment Recommendations  Rolling walker with 5" wheels    Recommendations for Other Services       Precautions / Restrictions Precautions Precautions: Fall Restrictions Weight Bearing Restrictions: No      Mobility  Bed Mobility Overal bed mobility: Needs Assistance Bed Mobility: Supine to Sit     Supine to sit: Min assist        Transfers Overall transfer level: Needs assistance Equipment used: Rolling walker (2 wheeled) Transfers: Sit to/from Stand Sit to Stand: Min assist         General transfer comment: heavy use of UEs to complete lift off  Ambulation/Gait Ambulation/Gait assistance: Min  assist Gait Distance (Feet): 5 Feet Assistive device: Rolling walker (2 wheeled)       General Gait Details: short, shuffling steps with poor step height/length (L > R); min assist for balance.  Mod reliance on RW, limited balance, limited functional endurance evident.  Desat to 83-84% with exertion on 4L with minimal exertion; 84-85% with seated rest and pursed lip breathing.  Unable to recover >85-86% despite increase to 6L (within 5 minutes). RN informed/aware and to monitor patient during extended recovery period.  Stairs            Wheelchair Mobility    Modified Rankin (Stroke Patients Only)       Balance Overall balance assessment: Needs assistance Sitting-balance support: No upper extremity supported;Feet supported Sitting balance-Leahy Scale: Good     Standing balance support: Bilateral upper extremity supported Standing balance-Leahy Scale: Fair                               Pertinent Vitals/Pain Pain Assessment: No/denies pain    Home Living Family/patient expects to be discharged to:: Private residence Living Arrangements: Spouse/significant other Available Help at Discharge: Family Type of Home: House Home Access: Ramped entrance     Home Layout: One level Home Equipment: None      Prior Function Level of Independence: Independent               Hand Dominance   Dominant Hand: Right    Extremity/Trunk Assessment   Upper Extremity Assessment Upper Extremity Assessment: Generalized weakness(grossly 4-/5 throughout)    Lower Extremity Assessment Lower Extremity Assessment:  Generalized weakness(grossly 4-/5 throughout)       Communication   Communication: No difficulties  Cognition Arousal/Alertness: Awake/alert Behavior During Therapy: WFL for tasks assessed/performed Overall Cognitive Status: Within Functional Limits for tasks assessed                                        General Comments       Exercises Other Exercises Other Exercises: Deep breathing and postural extension (chest/lung expansion) exercises, 1x10.  Fair tolerance for activities, mod coughing noted.  Sats maintained 85-86% on 6L once up in chair.   Assessment/Plan    PT Assessment Patient needs continued PT services  PT Problem List Decreased strength;Decreased range of motion;Decreased activity tolerance;Decreased balance;Decreased mobility;Decreased coordination;Decreased knowledge of use of DME;Decreased safety awareness;Decreased knowledge of precautions;Cardiopulmonary status limiting activity       PT Treatment Interventions DME instruction;Gait training;Functional mobility training;Therapeutic activities;Therapeutic exercise;Balance training;Patient/family education    PT Goals (Current goals can be found in the Care Plan section)  Acute Rehab PT Goals Patient Stated Goal: to return home PT Goal Formulation: With patient Time For Goal Achievement: 11/07/19 Potential to Achieve Goals: Good    Frequency Min 2X/week   Barriers to discharge Decreased caregiver support      Co-evaluation               AM-PAC PT "6 Clicks" Mobility  Outcome Measure Help needed turning from your back to your side while in a flat bed without using bedrails?: None Help needed moving from lying on your back to sitting on the side of a flat bed without using bedrails?: None Help needed moving to and from a bed to a chair (including a wheelchair)?: A Little Help needed standing up from a chair using your arms (e.g., wheelchair or bedside chair)?: A Little Help needed to walk in hospital room?: A Little Help needed climbing 3-5 steps with a railing? : A Little 6 Click Score: 20    End of Session Equipment Utilized During Treatment: Oxygen Activity Tolerance: Patient tolerated treatment well Patient left: in chair;with call bell/phone within reach;with chair alarm set Nurse Communication: Mobility status PT Visit  Diagnosis: Difficulty in walking, not elsewhere classified (R26.2);Muscle weakness (generalized) (M62.81)    Time: 1610-9604 PT Time Calculation (min) (ACUTE ONLY): 28 min   Charges:   PT Evaluation $PT Eval Moderate Complexity: 1 Mod PT Treatments $Therapeutic Activity: 8-22 mins        Jayliani Wanner H. Manson Passey, PT, DPT, NCS 10/24/19, 1:50 PM [phone]

## 2019-10-24 NOTE — Progress Notes (Signed)
[facility] Progress Note  Patient: Walter Baxter    JJO:[id number]  DOA: 10/20/2019     Date of Service: the patient was seen and examined on 10/24/2019  Chief Complaint  Patient presents with  . Shortness of Breath  . Weakness   Brief hospital course: Diet-controlled diabetes, HTN, HLD.  Patient presented with shortness of breath found to have acute hypoxic respiratory failure due to COVID-19 pneumonia Currently further plan is continue current treatment.  Assessment and Plan: 1.  Acute hypoxic failure secondary to COVID-19 pneumonia. RT-PCR positive. Currently on oxygen. On IV remdesivir. On IV Decadron. On vitamin C and zinc. Continue inhalers. CRP significantly elevated.   Pharmacy consulted for Actemra.  Patient consented for Actemra.  X1 on 10/22/2019. CRP continues to improve. Incentive spirometry ordered  2.  Essential hypertension Continue Lopressor  3.  Hyperlipidemia Not on a statin for now. Monitor.  4.  Type 2 diabetes mellitus.  Uncontrolled with hyperglycemia Hyperglycemia due to steroids. Continue to monitor.  On sliding scale insulin.  5.  Metabolic acidosis Acute kidney injury Renal function significantly better.  Monitor.  6.  Acute urinary retention. Etiology not clear likely from deconditioning. Undiagnosed BPH. In and out x4 did not improve the retention. We will continue with Flomax. Foley catheter. Outpatient follow-up with urology.  Diet: Carb modified diet DVT Prophylaxis: Subcutaneous Lovenox   Advance goals of care discussion: Full code  Family Communication: no family was present at bedside, at the time of interview.   Disposition:  Pt is from home, admitted with COVID-19 pneumonia, still has hypoxia on 4 LPM, which precludes a safe discharge. Discharge to home, when medically stable.  Subjective: Feeling better.  No nausea no vomiting.  No fever no chills.  Continues to remain hypoxic.  Physical Exam: General:   alert oriented to time, place, and person.  Appear in moderate distress, affect appropriate Eyes: PERRL ENT: Oral Mucosa Clear, moist  Neck: no JVD,  Cardiovascular: S1 and S2 Present, no Murmur,  Respiratory: good respiratory effort, Bilateral Air entry equal and Decreased, bilateral  Crackles, no wheezes Abdomen: Bowel Sound present, Soft and no tenderness,  Skin: no rash Extremities: no Pedal edema, no calf tenderness Neurologic: without any new focal findings Gait not checked due to patient safety concerns  Vitals:   10/24/19 0820 10/24/19 1143 10/24/19 1330 10/24/19 1618  BP: (!) 110/56 (!) 111/57  119/65  Pulse: 61 62  65  Resp: 18 18  18   Temp: (!) 97.4 F (36.3 C) (!) 97.5 F (36.4 C)  (!) 97.5 F (36.4 C)  TempSrc: Oral Oral  Oral  SpO2: 92% 91% 91% 93%  Weight:      Height:        Intake/Output Summary (Last 24 hours) at 10/24/2019 1914 Last data filed at 10/24/2019 1853 Gross per 24 hour  Intake -  Output 550 ml  Net -550 ml   Filed Weights   10/20/19 1724 10/20/19 2331 10/22/19 0532  Weight: 105.2 kg 99.9 kg 98.7 kg    Data Reviewed: I have personally reviewed and interpreted daily labs, tele strips, imagings as discussed above. I reviewed all nursing notes, pharmacy notes, vitals, pertinent old records I have discussed plan of care as described above with RN and patient/family.  CBC: Recent Labs  Lab 10/20/19 1730 10/21/19 0637 10/22/19 0734 10/23/19 0644 10/24/19 0433  WBC 9.8 6.4 13.0* 13.8* 12.8*  NEUTROABS 8.7* 5.8 11.7* 12.5* 10.6*  HGB 15.2 13.0 14.1 14.1 13.5  HCT 45.2 38.4* 41.7 41.2 39.2  MCV 86.9 85.9 84.9 86.0 85.2  PLT 243 221 302 318 310   Basic Metabolic Panel: Recent Labs  Lab 10/20/19 1730 10/21/19 0637 10/22/19 0734 10/23/19 0644 10/24/19 0433  NA 135 137 140 140 141  K 3.7 3.4* 3.6 3.7 3.3*  CL 100 107 107 109 108  CO2 22 20* 22 22 25   GLUCOSE 141* 178* 202* 204* 142*  BUN 21 24* 26* 26* 25*  CREATININE 1.33* 0.81  0.92 0.94 0.95  CALCIUM 7.8* 7.3* 7.7* 7.8* 7.5*  MG  --  2.2 2.1 2.2 2.1  PHOS  --  2.5 3.0 2.8 2.9    Studies: DG Chest Port 1 View  Result Date: 10/24/2019 CLINICAL DATA:  Shortness of breath. EXAM: PORTABLE CHEST 1 VIEW COMPARISON:  October 20, 2019. FINDINGS: Stable cardiomediastinal silhouette. Atherosclerosis of thoracic aorta is noted. No pneumothorax or pleural effusion is noted. Mildly increased bilateral patchy ill-defined airspace opacities are noted consistent with multifocal pneumonia. Bony thorax is unremarkable. IMPRESSION: Aortic atherosclerosis. Mildly increased bilateral patchy airspace opacities are noted consistent with multifocal pneumonia. Electronically Signed   By: October 22, 2019 M.D.   On: 10/24/2019 08:08    Scheduled Meds: . vitamin C  500 mg Oral Daily  . Chlorhexidine Gluconate Cloth  6 each Topical Daily  . dexamethasone (DECADRON) injection  6 mg Intravenous Q24H  . enoxaparin (LOVENOX) injection  40 mg Subcutaneous Q24H  . famotidine  20 mg Oral BID  . insulin aspart  0-5 Units Subcutaneous QHS  . insulin aspart  0-9 Units Subcutaneous TID WC  . Ipratropium-Albuterol  1 puff Inhalation Q6H  . metoprolol tartrate  50 mg Oral BID  . sodium chloride flush  3 mL Intravenous Q12H  . tamsulosin  0.4 mg Oral Daily  . zinc sulfate  220 mg Oral Daily   Continuous Infusions: . sodium chloride 75 mL (10/24/19 1013)   PRN Meds: sodium chloride, acetaminophen, chlorpheniramine-HYDROcodone, guaiFENesin-dextromethorphan, ondansetron **OR** ondansetron (ZOFRAN) IV  Time spent: 35 minutes  Author: 12/22/19, MD [facility] 10/24/2019 7:14 PM  To reach On-call, see care teams to locate the attending and reach out to them via [email or web address]. If 7PM-7AM, please contact night-coverage If you still have difficulty reaching the attending provider, please page the [facility] (Director on Call) for [facility] on amion for assistance.

## 2019-10-24 NOTE — Progress Notes (Signed)
PT Cancellation Note  Patient Details Name: Walter Baxter MRN: [id number] DOB: Jun 29, 1941   Cancelled Treatment:    Reason Eval/Treat Not Completed: (Consult received and chart reviewed. Per primary RN, remdesivir currently running and IV site somewhat fragile; requests therapist hold until infusion complete.  Will re-attempt at later time/date as medically appropriate and available.)   Avry Monteleone H. Manson Passey, PT, DPT, NCS 10/24/19, 10:42 AM [phone]

## 2019-10-24 NOTE — Progress Notes (Signed)
   Patient HR down to 38 while asleep per Tele monitor last night. On assessment patient denies any adverse symptoms. HR remains stable high 50s-60s. Patient A/O x 4, NAD, VSS this morning.

## 2019-10-25 LAB — CBC WITH DIFFERENTIAL/PLATELET
Abs Immature Granulocytes: 0.34 10*3/uL — ABNORMAL HIGH (ref 0.00–0.07)
Basophils Absolute: 0 10*3/uL (ref 0.0–0.1)
Basophils Relative: 0 %
Eosinophils Absolute: 0 10*3/uL (ref 0.0–0.5)
Eosinophils Relative: 0 %
HCT: 42.6 % (ref 39.0–52.0)
Hemoglobin: 14.3 g/dL (ref 13.0–17.0)
Immature Granulocytes: 2 %
Lymphocytes Relative: 5 %
Lymphs Abs: 0.8 10*3/uL (ref 0.7–4.0)
MCH: 29.1 pg (ref 26.0–34.0)
MCHC: 33.6 g/dL (ref 30.0–36.0)
MCV: 86.6 fL (ref 80.0–100.0)
Monocytes Absolute: 0.4 10*3/uL (ref 0.1–1.0)
Monocytes Relative: 2 %
Neutro Abs: 14.7 10*3/uL — ABNORMAL HIGH (ref 1.7–7.7)
Neutrophils Relative %: 91 %
Platelets: 271 10*3/uL (ref 150–400)
RBC: 4.92 MIL/uL (ref 4.22–5.81)
RDW: 14 % (ref 11.5–15.5)
WBC: 16.2 10*3/uL — ABNORMAL HIGH (ref 4.0–10.5)
nRBC: 0.1 % (ref 0.0–0.2)

## 2019-10-25 LAB — GLUCOSE, CAPILLARY
Glucose-Capillary: 199 mg/dL — ABNORMAL HIGH (ref 70–99)
Glucose-Capillary: 199 mg/dL — ABNORMAL HIGH (ref 70–99)
Glucose-Capillary: 151 mg/dL — ABNORMAL HIGH (ref 70–99)
Glucose-Capillary: 130 mg/dL — ABNORMAL HIGH (ref 70–99)

## 2019-10-25 LAB — COMPREHENSIVE METABOLIC PANEL
ALT: 40 U/L (ref 0–44)
AST: 42 U/L — ABNORMAL HIGH (ref 15–41)
Albumin: 2.3 g/dL — ABNORMAL LOW (ref 3.5–5.0)
Alkaline Phosphatase: 58 U/L (ref 38–126)
Anion gap: 9 (ref 5–15)
BUN: 29 mg/dL — ABNORMAL HIGH (ref 8–23)
CO2: 22 mmol/L (ref 22–32)
Calcium: 7.6 mg/dL — ABNORMAL LOW (ref 8.9–10.3)
Chloride: 108 mmol/L (ref 98–111)
Creatinine, Ser: 1.01 mg/dL (ref 0.61–1.24)
GFR calc Af Amer: >60 mL/min (ref >60)
GFR calc non Af Amer: >60 mL/min (ref >60)
Glucose, Bld: 248 mg/dL — ABNORMAL HIGH (ref 70–99)
Potassium: 4.7 mmol/L (ref 3.5–5.1)
Sodium: 139 mmol/L (ref 135–145)
Total Bilirubin: 0.6 mg/dL (ref 0.3–1.2)
Total Protein: 5.4 g/dL — ABNORMAL LOW (ref 6.5–8.1)

## 2019-10-25 LAB — PHOSPHORUS: Phosphorus: 3.5 mg/dL (ref 2.5–4.6)

## 2019-10-25 LAB — FIBRIN DERIVATIVES D-DIMER (ARMC ONLY): Fibrin derivatives D-dimer (ARMC): 4408.51 ng/mL (FEU) — ABNORMAL HIGH (ref 0.00–499.00)

## 2019-10-25 LAB — MAGNESIUM: Magnesium: 2 mg/dL (ref 1.7–2.4)

## 2019-10-25 LAB — FERRITIN: Ferritin: 2081 ng/mL — ABNORMAL HIGH (ref 24–336)

## 2019-10-25 LAB — C-REACTIVE PROTEIN: CRP: 2.1 mg/dL — ABNORMAL HIGH (ref <1.0)

## 2019-10-25 NOTE — Progress Notes (Signed)
Physical Therapy Treatment Patient Details Name: Walter Baxter MRN: [id number] DOB: 21-Nov-1940 Today's Date: 10/25/2019    History of Present Illness presented to ER secondary to SOB, generalized weakness; admitted for management of acute respiratory failure related to COVID-19 PNA.    PT Comments    Pt in recliner.  Reports fatigue and wanting to get back to bed.  Stated he has been up since 10:30 and nobody has been in the room to help him.  OT note indicates pt got up around 11:30 and upon arrival 2 techs coming out of room.  They reported he seemed confused but easily re-orientated.  Pt stood and transferred well with RW back to bed.  Encouraged pt to walk or at minimum do some ex but he refused and return to supine without assist.  All needs in reach and bed alarm set.    Follow Up Recommendations  Home health PT     Equipment Recommendations  Rolling walker with 5" wheels    Recommendations for Other Services       Precautions / Restrictions Precautions Precautions: Fall Restrictions Weight Bearing Restrictions: No    Mobility  Bed Mobility Overal bed mobility: Needs Assistance Bed Mobility: Sit to Supine Rolling: Supervision Sidelying to sit: Min guard;Supervision   Sit to supine: Min guard   General bed mobility comments: no assist needed to return to bed  Transfers Overall transfer level: Needs assistance Equipment used: Rolling walker (2 wheeled) Transfers: Sit to/from Stand Sit to Stand: Min guard         General transfer comment: Overall steady transfer.  Ambulation/Gait Ambulation/Gait assistance: Min guard Gait Distance (Feet): 3 Feet Assistive device: Rolling walker (2 wheeled) Gait Pattern/deviations: Step-to pattern     General Gait Details: steady with transfer but no true gait   Stairs             Wheelchair Mobility    Modified Rankin (Stroke Patients Only)       Balance Overall balance assessment: Needs  assistance Sitting-balance support: Feet supported;No upper extremity supported Sitting balance-Leahy Scale: Good Sitting balance - Comments: Steady static sitting, reaching within BOS.   Standing balance support: Bilateral upper extremity supported;During functional activity Standing balance-Leahy Scale: Fair                              Cognition Arousal/Alertness: Awake/alert Behavior During Therapy: WFL for tasks assessed/performed Overall Cognitive Status: No family/caregiver present to determine baseline cognitive functioning                                 General Comments: Confusion noted today.  Pt stated nobody had been in his room to help him today - I passed 2 nurse techs coming out of the room as I was entering.      Exercises Other Exercises Other Exercises: Pt educated on falls prevention strategies, safe use of AE/DME for ADL mgt, energy conservation strategies including activity pacing and pursed lip breathing during functional activity tolerance for improved safety and functional independence upon hospital DC.    General Comments General comments (skin integrity, edema, etc.): Pt HR monitored t/o session. Remains WFLs (70's-80's) during session.      Pertinent Vitals/Pain Pain Assessment: No/denies pain Pain Score: 4  Pain Location: low back Pain Descriptors / Indicators: Sore Pain Intervention(s): Limited activity within patient's tolerance;Monitored during session;Repositioned  Home Living Family/patient expects to be discharged to:: Private residence Living Arrangements: Spouse/significant other Available Help at Discharge: Family Type of Home: House Home Access: Ramped entrance   Home Layout: One level Home Equipment: Shower seat;Bedside commode      Prior Function Level of Independence: Independent          PT Goals (current goals can now be found in the care plan section) Acute Rehab PT Goals Patient Stated Goal: to  return home Progress towards PT goals: Progressing toward goals    Frequency    Min 2X/week      PT Plan Current plan remains appropriate    Co-evaluation              AM-PAC PT "6 Clicks" Mobility   Outcome Measure  Help needed turning from your back to your side while in a flat bed without using bedrails?: None Help needed moving from lying on your back to sitting on the side of a flat bed without using bedrails?: None Help needed moving to and from a bed to a chair (including a wheelchair)?: A Little Help needed standing up from a chair using your arms (e.g., wheelchair or bedside chair)?: A Little Help needed to walk in hospital room?: A Little Help needed climbing 3-5 steps with a railing? : A Little 6 Click Score: 20    End of Session Equipment Utilized During Treatment: Oxygen Activity Tolerance: Patient tolerated treatment well Patient left: in bed;with call bell/phone within reach;with bed alarm set Nurse Communication: Mobility status       Time: 1230-1240 PT Time Calculation (min) (ACUTE ONLY): 10 min  Charges:  $Therapeutic Activity: 8-22 mins                    Danielle Dess, PTA 10/25/19, 12:50 PM

## 2019-10-25 NOTE — Evaluation (Signed)
Occupational Therapy Evaluation Patient Details Name: Walter Baxter MRN: [id number] DOB: Mar 26, 1941 Today's Date: 10/25/2019    History of Present Illness presented to ER secondary to SOB, generalized weakness; admitted for management of acute respiratory failure related to COVID-19 PNA.   Clinical Impression   Walter Baxter was seen for OT evaluation this date. Walter Baxter that prior to COVID-19 diagnosis, Walter Baxter was active and independent, completing all ADL tasks independently, ambulating without an assistive device, and not using supplemental O2 in the home. Walter Baxter endorses that Walter Baxter provides consistent care for Walter Baxter wife who requires assist for BADL management at baseline. Since Walter Baxter CV -19 diaganosis and subsequent admission, Walter Baxter that they require increased assistance with ADL tasks including bathing, dressing and functional mobility. Walter Baxter becoming easily fatigued or out of breath with minimal exertion. Walter Baxter currently requires minimal to moderate assist for exertional ADL tasks including bathing, dressing, and grooming tasks in a seated position due to increased shortness of breath and decreased activity tolerance. Walter Baxter received on 4L [place], but did not have continuous pulse ox. attached to telemetry box this date. Walter Baxter increased to 5L [place] after endorsing increased SOB during functional mobility. Walter Baxter educated in energy conservation conservation strategies including pursed lip breathing, activity pacing, AE/DME, prioritizing of meaningful occupations, and falls prevention. Walter Baxter return demonstrates PLB technique with moderate cueing from this author. Walter Baxter would benefit from additional skilled OT services to maximize recall and carryover of learned techniques and facilitate implementation of learned techniques into daily routines. Upon discharge, recommend HHOT services to maximize Walter Baxter safety and return to functional independence during meaningful occupations of daily life.     Follow Up Recommendations  Home  health OT    Equipment Recommendations  None recommended by OT(Walter Baxter has BSC and shower seat at home.)    Recommendations for Other Services       Precautions / Restrictions Precautions Precautions: Fall Restrictions Weight Bearing Restrictions: No      Mobility Bed Mobility Overal bed mobility: Needs Assistance Bed Mobility: Sidelying to Sit;Rolling Rolling: Supervision Sidelying to sit: Min guard;Supervision       General bed mobility comments: Min cueing for use of adapted "log roll" bed mobility technique for EC this date.  Transfers Overall transfer level: Needs assistance Equipment used: Rolling walker (2 wheeled) Transfers: Sit to/from Stand Sit to Stand: Min assist         General transfer comment: heavy use of UEs to complete lift off, bed slightly elevated. Mod cueing for hand/foot placement.    Balance Overall balance assessment: Needs assistance Sitting-balance support: Feet supported;No upper extremity supported Sitting balance-Leahy Scale: Good Sitting balance - Comments: Steady static sitting, reaching within BOS.   Standing balance support: Bilateral upper extremity supported;During functional activity Standing balance-Leahy Scale: Fair                             ADL either performed or assessed with clinical judgement   ADL Overall ADL's : Needs assistance/impaired                                       General ADL Comments: Walter Baxter functionally limited by poor activity tolerance, cardiopulmonary status, and generalized weakness. Walter Baxter endorses Walter Baxter is typically independent for all ADL/IADL at baseline. Currently requires min A to CGA for fxl mobility with modreate cueing to incorporate energy conservation  strategies including activity pacing and pursed lilp breathing. Will require increased assistance for heavy ADL tasks such as LB bathing and dressing. Walter Baxter becomes SOB quickly and fatigues after brief fxl transfer to chair this  date. Unable to asses spO2 response to acitivty as Walter Baxter did not have continuous pulse ox connected to tele box during session.     Vision Baseline Vision/History: Cataracts(Walter Baxter scheduled for sx this month, however COVID diagnosis will likely interfere.) Patient Visual Report: No change from baseline       Perception     Praxis      Pertinent Vitals/Pain Pain Assessment: 0-10 Pain Score: 4  Pain Location: low back Pain Descriptors / Indicators: Sore Pain Intervention(s): Limited activity within patient's tolerance;Monitored during session;Repositioned     Hand Dominance Right   Extremity/Trunk Assessment Upper Extremity Assessment Upper Extremity Assessment: Generalized weakness   Lower Extremity Assessment Lower Extremity Assessment: Generalized weakness   Cervical / Trunk Assessment Cervical / Trunk Assessment: Normal   Communication Communication Communication: No difficulties   Cognition Arousal/Alertness: Awake/alert Behavior During Therapy: WFL for tasks assessed/performed Overall Cognitive Status: Within Functional Limits for tasks assessed                                     General Comments  Walter Baxter HR monitored t/o session. Remains WFLs (70's-80's) during session.    Exercises Other Exercises Other Exercises: Walter Baxter educated on falls prevention strategies, safe use of AE/DME for ADL mgt, energy conservation strategies including activity pacing and pursed lip breathing during functional activity tolerance for improved safety and functional independence upon hospital DC.   Shoulder Instructions      Home Living Family/patient expects to be discharged to:: Private residence Living Arrangements: Spouse/significant other Available Help at Discharge: Family Type of Home: House Home Access: [place]: One level     Bathroom Shower/Tub: Teacher, early years/pre: Handicapped height     Home Equipment: Shower  seat;Bedside commode          Prior Functioning/Environment Level of Independence: Independent                 OT Problem List: Decreased strength;Cardiopulmonary status limiting activity;Decreased range of motion;Decreased activity tolerance;Decreased safety awareness;Impaired balance (sitting and/or standing);Decreased knowledge of use of DME or AE      OT Treatment/Interventions: Therapeutic exercise;Therapeutic activities;Self-care/ADL training;DME and/or AE instruction;Energy conservation;Patient/family education;Balance training    OT Goals(Current goals can be found in the care plan section) Acute Rehab OT Goals Patient Stated Goal: to return home OT Goal Formulation: With patient Time For Goal Achievement: 11/08/19 Potential to Achieve Goals: Good ADL Goals Walter Baxter Will Perform Upper Body Dressing: sitting;with modified independence(With LRAD/cueing PRN for improved safety and incorporation of energy conservation strategies.) Walter Baxter Will Perform Lower Body Dressing: sit to/from stand;with set-up;with supervision(With LRAD/cueing PRN for improved safety and incorporation of energy conservation strategies.) Walter Baxter Will Transfer to Toilet: bedside commode;ambulating;with modified independence(With LRAD/cueing PRN for improved safety and incorporation of energy conservation strategies.) Walter Baxter Will Perform Toileting - Clothing Manipulation and hygiene: with adaptive equipment;sit to/from stand;with modified independence(With LRAD/cueing PRN for improved safety and incorporation of energy conservation strategies.)  OT Frequency: Min 2X/week   Barriers to D/C: Decreased caregiver support;Inaccessible home environment          Co-evaluation              AM-PAC  OT "6 Clicks" Daily Activity     Outcome Measure Help from another person eating meals?: None Help from another person taking care of personal grooming?: A Little Help from another person toileting, which includes using toliet,  bedpan, or urinal?: A Little Help from another person bathing (including washing, rinsing, drying)?: A Lot Help from another person to put on and taking off regular upper body clothing?: None Help from another person to put on and taking off regular lower body clothing?: A Lot 6 Click Score: 18   End of Session Equipment Utilized During Treatment: Gait belt;Rolling walker;Oxygen(Walter Baxter on 5L during functional mobility. Endorses increased SOB with exertion.)  Activity Tolerance: Patient tolerated treatment well Patient left: in chair;with call bell/phone within reach;with chair alarm set  OT Visit Diagnosis: Muscle weakness (generalized) (M62.81);Other abnormalities of gait and mobility (R26.89)                Time: 6886-4847 OT Time Calculation (min): 40 min Charges:  OT General Charges $OT Visit: 1 Visit OT Evaluation $OT Eval Moderate Complexity: 1 Mod OT Treatments $Self Care/Home Management : 23-37 mins  Rockney Ghee, M.S., OTR/L Ascom: [phone] 10/25/19, 11:27 AM

## 2019-10-25 NOTE — Care Management Important Message (Signed)
Important Message  Patient Details  Name: Walter Baxter MRN: [id number] Date of Birth: 07/12/1941   Medicare Important Message Given:  Yes  Reviewed with patient over room phone due to isolation.  Declined copy of Medicare IM as one mailed to home address by Patient [facility] earlier.     Johnell Comings 10/25/2019, 11:43 AM

## 2019-10-25 NOTE — Progress Notes (Signed)
Patient foley d/c'd at this time.  Given urinal.  Patient verbalizes understanding to use urinal and call nursing when he urinates.

## 2019-10-25 NOTE — Progress Notes (Signed)
Per nurse tech, patient able to urinate without complaints.

## 2019-10-25 NOTE — TOC Initial Note (Signed)
Transition of Care [facility]) - Initial/Assessment Note    Patient Details  Name: Walter Baxter MRN: [id number] Date of Birth: 07-07-1941  [facility] CM/SW Contact:    Eileen Stanford, LCSW Phone Number: 10/25/2019, 12:57 PM  Clinical Narrative:   Pt is alert and oriented. Pt is COVID +. Pt lives at  Home with spouse. Pt is agreeable to [facility] Pt is agreeable to [place]. Pt states he does not have a walker at home. PT recommending Walker. CSW will contact [employer].                Expected Discharge Plan: [place] Barriers to Discharge: Continued Medical Work up   Patient Goals and CMS Choice Patient states their goals for this hospitalization and ongoing recovery are:: TO [place] CMS Medicare.gov Compare Post Acute Care list provided to:: Patient Choice offered to / list presented to : Patient  Expected Discharge Plan and Services Expected Discharge Plan: [place] arrangements for the past 2 months: Single Family Home                 DME Arranged: Gilford Rile DME Agency: [employer] Date DME Agency Contacted: 10/25/19 Time DME Agency Contacted: [phone] Representative spoke with at DME Agency: [place]: PT, OT [place] Agency: [place] ([place]) Date [place]: 10/25/19 Time [place]: 57 Representative spoke with at [place]: Corene Cornea  Prior Living Arrangements/Services Living arrangements for the past 2 months: [place] Lives with:: Spouse Patient language and need for interpreter reviewed:: Yes Do you feel safe going back to the place where you live?: Yes      Need for Family Participation in Patient Care: Yes (Comment) Care giver support system in place?: Yes (comment)   Criminal Activity/Legal Involvement Pertinent to Current Situation/Hospitalization: No - Comment as needed  Activities of Daily Living Home Assistive  Devices/Equipment: None ADL Screening (condition at time of admission) Patient's cognitive ability adequate to safely complete daily activities?: Yes Is the patient deaf or have difficulty hearing?: No Does the patient have difficulty seeing, even when wearing glasses/contacts?: No Does the patient have difficulty concentrating, remembering, or making decisions?: No Patient able to express need for assistance with ADLs?: Yes Does the patient have difficulty dressing or bathing?: No Independently performs ADLs?: Yes (appropriate for developmental age) Does the patient have difficulty walking or climbing stairs?: Yes Weakness of Legs: Both Weakness of Arms/Hands: Both  Permission Sought/Granted Permission sought to share information with : Family Supports    Share Information with NAME: Jamas Lav  Permission granted to share info w AGENCY: [place] granted to share info w Relationship: spouse     Emotional Assessment Appearance:: Appears stated age Attitude/Demeanor/Rapport: Engaged Affect (typically observed): Accepting, Appropriate, Calm Orientation: : Oriented to Self, Oriented to Place, Oriented to  Time, Oriented to Situation Alcohol / Substance Use: Not Applicable Psych Involvement: No (comment)  Admission diagnosis:  Weakness [R53.1] Hypoxia [R09.02] Acute hypoxemic respiratory failure due to COVID-19 ([place]) [U07.1, J96.01] COVID-19 [U07.1] Patient Active Problem List   Diagnosis Date Noted  . Acute hypoxemic respiratory failure due to COVID-19 ([place]) 10/20/2019  . Essential hypertension   . Hyperlipidemia   . Type 2 diabetes mellitus ([place])    PCP:  Mayra Neer, MD Pharmacy:   [facility] [place], [place] - [phone] [address] AT [facility] [address] [place] [address] Phone: [phone] Fax: [phone]     Social Determinants of Health (SDOH) Interventions    Readmission Risk  Interventions No flowsheet data found.

## 2019-10-25 NOTE — Progress Notes (Signed)
[facility] Progress Note  Patient: Walter Baxter    TKZ:[id number]  DOA: 10/20/2019     Date of Service: the patient was seen and examined on 10/25/2019  Chief Complaint  Patient presents with  . Shortness of Breath  . Weakness   Brief hospital course: Walter Baxter is a 79 y.o. male with medical history significant for diet-controlled diabetes, hypertension, and hyperlipidemia who presents to the ED for evaluation of shortness of breath.  Patient reports new sudden onset of significant generalized weakness, fatigue, and shortness of breath beginning the night before admission.  He has had associated intermittent fevers, chills, loss of taste/smell, and rare nonproductive cough.  He denies any nausea, vomiting, abdominal pain, diarrhea, or dysuria.  He says his wife is currently admitted for Covid 19 infection as well.  ED Course:  Initial vitals showed BP 165/64, pulse 94, RR 32, temp 100.5 Fahrenheit, SPO2 95% on 6 L supplemental O2 via [place].  Per report, patient desaturated to 86% on room air.  Labs are notable for WBC 9.8, hemoglobin 15.2, platelets 243,000, BUN 21, creatinine 1.33, sodium 135, potassium 3.7, bicarb 22, AST 46, ALT 24, procalcitonin 0.21, fibrin derivatives D-dimer 2932.23, lactic acid 3.7, LDH 363, ferritin 3396, triglycerides 249, fibrinogen >750, CRP pending.  Portable chest x-ray showed airspace opacities in the peripheral right midlung and left lower lung.  Patient was given 1 L normal saline and the hospitalist service was consulted to admit for further evaluation and management.  Currently further plan is continue covid management, potentially home tomorrow.  Assessment and Plan: 1. Acute hypoxic failure secondary to COVID-19 pneumonia. RT-PCR positive. Currently on oxygen. Completed IV remdesivir. Currently on IV Decadron. On vitamin C and zinc. Continue inhalers. CRP significantly elevated.Trending down from 20.9-2.1. Pharmacy consulted  for Actemra. Patient consented for Actemra. X1 on 10/22/2019. Incentive spirometry ordered Patient significantly slow to progress.  2. Essential hypertension Continue Lopressor  3. Hyperlipidemia Not on a statin for now. Monitor.  4. Type 2 diabetes mellitus. Uncontrolled with hyperglycemia Hyperglycemia due to steroids. Continue to monitor. On sliding scale insulin.  5. Metabolic acidosis Acute kidney injury Renal function significantly better. Monitor.  6.Acute urinary retention. Etiology not clear likely from deconditioning. Undiagnosed BPH. In and out x4 did not improve the retention. We will continue with Flomax. Foley catheter. Outpatient follow-up with urology.  Diet: carb modified diet DVT Prophylaxis: Subcutaneous Lovenox   Advance goals of care discussion: Full code  Family Communication: no family was present at bedside, at the time of interview.   Disposition:  Pt is from home, admitted with covid 19 pneumonia, still has hypoxia, which precludes a safe discharge. Discharge to home, when symptoms improve and D-dimer starts trending down, potentially tomorrow.  Subjective: Continues to have fatigue and tiredness no nausea no vomiting.  Has some swelling in his legs.  No fever no chills.  Physical Exam: General:  Alert oriented to time, place, and person.  Appear in moderate distress, affect appropriate Eyes: PERRL ENT: Oral Mucosa Clear, moist  Neck: no JVD,  Cardiovascular: S1 and S2 Present, no Murmur,  Respiratory: good respiratory effort, Bilateral Air entry equal and Decreased, bilateral  Crackles, no wheezes Abdomen: Bowel Sound present, Soft and no tenderness,  Skin: no rash Extremities: trace Pedal edema, no calf tenderness Neurologic: without any new focal findings Gait not checked due to patient safety concerns  Vitals:   10/25/19 0456 10/25/19 0830 10/25/19 1208 10/25/19 1624  BP: 138/75 (!) 143/72 (!) 95/58  138/62  Pulse:  64 60 (!) 59 62  Resp: 18 18 18 16   Temp: 97.6 F (36.4 C) (!) 97.4 F (36.3 C) (!) 97.4 F (36.3 C) (!) 97.4 F (36.3 C)  TempSrc: Oral Oral Oral Oral  SpO2: 92% 93% (!) 87% 93%  Weight:      Height:        Intake/Output Summary (Last 24 hours) at 10/25/2019 1938 Last data filed at 10/25/2019 1716 Gross per 24 hour  Intake 120 ml  Output 1100 ml  Net -980 ml   Filed Weights   10/20/19 1724 10/20/19 2331 10/22/19 0532  Weight: 105.2 kg 99.9 kg 98.7 kg    Data Reviewed: I have personally reviewed and interpreted daily labs, tele strips, imagings as discussed above. I reviewed all nursing notes, pharmacy notes, vitals, pertinent old records I have discussed plan of care as described above with RN and patient/family.  CBC: Recent Labs  Lab 10/21/19 0637 10/22/19 0734 10/23/19 0644 10/24/19 0433 10/25/19 0731  WBC 6.4 13.0* 13.8* 12.8* 16.2*  NEUTROABS 5.8 11.7* 12.5* 10.6* 14.7*  HGB 13.0 14.1 14.1 13.5 14.3  HCT 38.4* 41.7 41.2 39.2 42.6  MCV 85.9 84.9 86.0 85.2 86.6  PLT 221 302 318 310 494   Basic Metabolic Panel: Recent Labs  Lab 10/21/19 0637 10/22/19 0734 10/23/19 0644 10/24/19 0433 10/25/19 0731  NA 137 140 140 141 139  K 3.4* 3.6 3.7 3.3* 4.7  CL 107 107 109 108 108  CO2 20* 22 22 25 22   GLUCOSE 178* 202* 204* 142* 248*  BUN 24* 26* 26* 25* 29*  CREATININE 0.81 0.92 0.94 0.95 1.01  CALCIUM 7.3* 7.7* 7.8* 7.5* 7.6*  MG 2.2 2.1 2.2 2.1 2.0  PHOS 2.5 3.0 2.8 2.9 3.5    Studies: No results found.  Scheduled Meds: . vitamin C  500 mg Oral Daily  . dexamethasone (DECADRON) injection  6 mg Intravenous Q24H  . enoxaparin (LOVENOX) injection  40 mg Subcutaneous Q24H  . famotidine  20 mg Oral BID  . insulin aspart  0-5 Units Subcutaneous QHS  . insulin aspart  0-9 Units Subcutaneous TID WC  . Ipratropium-Albuterol  1 puff Inhalation Q6H  . metoprolol tartrate  50 mg Oral BID  . sodium chloride flush  3 mL Intravenous Q12H  . tamsulosin  0.4 mg Oral  Daily  . zinc sulfate  220 mg Oral Daily   Continuous Infusions: . sodium chloride Stopped (10/24/19 1900)   PRN Meds: sodium chloride, acetaminophen, chlorpheniramine-HYDROcodone, guaiFENesin-dextromethorphan, ondansetron **OR** ondansetron (ZOFRAN) IV  Time spent: 35 minutes  Author: Berle Mull, MD [facility] 10/25/2019 7:38 PM  To reach On-call, see care teams to locate the attending and reach out to them via [email or web address]. If 7PM-7AM, please contact night-coverage If you still have difficulty reaching the attending provider, please page the [facility] (Director on Call) for [facility] on amion for assistance.

## 2019-10-26 ENCOUNTER — Encounter: Payer: Self-pay | Admitting: Internal Medicine

## 2019-10-26 ENCOUNTER — Inpatient Hospital Stay: Payer: Medicare Other

## 2019-10-26 LAB — BASIC METABOLIC PANEL
Anion gap: 10 (ref 5–15)
BUN: 29 mg/dL — ABNORMAL HIGH (ref 8–23)
CO2: 21 mmol/L — ABNORMAL LOW (ref 22–32)
Calcium: 7.6 mg/dL — ABNORMAL LOW (ref 8.9–10.3)
Chloride: 107 mmol/L (ref 98–111)
Creatinine, Ser: 0.96 mg/dL (ref 0.61–1.24)
GFR calc Af Amer: >60 mL/min (ref >60)
GFR calc non Af Amer: >60 mL/min (ref >60)
Glucose, Bld: 201 mg/dL — ABNORMAL HIGH (ref 70–99)
Potassium: 4.4 mmol/L (ref 3.5–5.1)
Sodium: 138 mmol/L (ref 135–145)

## 2019-10-26 LAB — GLUCOSE, CAPILLARY
Glucose-Capillary: 208 mg/dL — ABNORMAL HIGH (ref 70–99)
Glucose-Capillary: 187 mg/dL — ABNORMAL HIGH (ref 70–99)
Glucose-Capillary: 124 mg/dL — ABNORMAL HIGH (ref 70–99)
Glucose-Capillary: 145 mg/dL — ABNORMAL HIGH (ref 70–99)

## 2019-10-26 LAB — PROCALCITONIN: Procalcitonin: <0.1 ng/mL

## 2019-10-26 LAB — FIBRIN DERIVATIVES D-DIMER (ARMC ONLY): Fibrin derivatives D-dimer (ARMC): 4300.22 ng/mL (FEU) — ABNORMAL HIGH (ref 0.00–499.00)

## 2019-10-26 LAB — C-REACTIVE PROTEIN: CRP: 1.3 mg/dL — ABNORMAL HIGH (ref <1.0)

## 2019-10-26 MED ORDER — RIVAROXABAN 20 MG PO TABS: 20.0000 mg | ORAL_TABLET | Freq: Every day | ORAL | Status: DC

## 2019-10-26 MED ORDER — IOHEXOL 350 MG/ML SOLN
75.0000 mL | Freq: Once | INTRAVENOUS | Status: AC | PRN
  Administered 2019-10-26: 75 mL via INTRAVENOUS

## 2019-10-26 MED ORDER — RIVAROXABAN 15 MG PO TABS
15.0000 mg | ORAL_TABLET | Freq: Two times a day (BID) | ORAL | Status: DC
  Administered 2019-10-26 – 2019-11-05 (×20): 15 mg via ORAL
  Filled 2019-10-26 (×22): qty 1

## 2019-10-26 NOTE — Progress Notes (Signed)
ANTICOAGULATION CONSULT NOTE  Pharmacy Consult for Xarelto Indication: DVT  Allergies  Allergen Reactions  . Other Rash    Magic mouth wash     Patient Measurements: Height: 5\' 8"  (172.7 cm) Weight: 217 lb 8 oz (98.7 kg) IBW/kg (Calculated) : 68.4  Vital Signs: Temp: 98.1 F (36.7 C) (02/03 1552) Temp Source: Oral (02/03 1552) BP: 133/66 (02/03 1552) Pulse Rate: 59 (02/03 1552)  Labs: Recent Labs    10/24/19 0433 10/25/19 0731 10/26/19 0841  HGB 13.5 14.3  --   HCT 39.2 42.6  --   PLT 310 271  --   CREATININE 0.95 1.01 0.96    Estimated Creatinine Clearance: 73.4 mL/min (by C-G formula based on SCr of 0.96 mg/dL).   Medical History: Past Medical History:  Diagnosis Date  . Allergic rhinitis   . Angio-edema   . Chronic bronchitis (HCC)   . Erectile dysfunction   . Essential hypertension   . Hyperlipidemia   . Obesity   . Type 2 diabetes mellitus [facility])      Assessment: 79 year old male presented to ED with SOB and weakness. Ultrasound lower extremities 2/3 with DVT of right posterior tibial and peroneal vein. Pharmacy consulted for Xarelto. Patient previously on Lovenox 40 mg daily with last dose 2/2 at 2000.  Goal of Therapy:  Monitor platelets by anticoagulation protocol: Yes   Plan:  Xarelto 15 mg BID with meals for 21 days followed by 20 mg daily with supper. Will follow CBC q72h while inpatient.  2001, PharmD 10/26/2019,4:08 PM

## 2019-10-26 NOTE — Progress Notes (Addendum)
PT Cancellation Note  Patient Details Name: Walter Baxter MRN: [id number] DOB: 20-May-1941   Cancelled Treatment:     PT attempt. Pt with RLE intraluminal thrombus with occlusion of posterior tibial vein. Per PT protocol will hold this date. PT will continue to follow pt per POC.    Jetta Lout PTA 10/26/19, 12:57 PM

## 2019-10-26 NOTE — Progress Notes (Signed)
OT Cancellation Note  Patient Details Name: Walter Baxter MRN: [id number] DOB: Oct 09, 1940   Cancelled Treatment:    Reason Eval/Treat Not Completed: Medical issues which prohibited therapy. Chart reviewed. Pt noted with RLE intraluminal thrombus with occlusion of posterior tibial vein. Per therapy protocol will hold this date. OT will continue to follow pt per POC and see for OT tx as appropriate.   Rockney Ghee, M.S., OTR/L Ascom: [phone] 10/26/19, 1:06 PM

## 2019-10-26 NOTE — Progress Notes (Signed)
PROGRESS NOTE    Walter Baxter  HMC:[id number] DOB: 06-Nov-1940 DOA: 10/20/2019 PCP: Mayra Neer, MD   Brief Narrative:  Walter Baxter a 79 y.o.malewith medical history significant fordiet-controlled diabetes, hypertension, and hyperlipidemia who presents to the ED for evaluation of shortness of breath.Patient reports new sudden onset of significant generalized weakness, fatigue, and shortness of breath beginning the night before admission. He has had associated intermittent fevers, chills, loss of taste/smell, and rare nonproductive cough. Admitted for COVID-19 pneumonia, treated with remdesivir, Decadron and Actemra.  Continue to require O2 supplement Wife was also admitted for COVID-19 infection.  Subjective: Patient was still feeling shortness of breath.  Denies any chest pain.  He was getting his lower extremity venous Doppler studies.  Assessment & Plan:   Principal Problem:   Acute hypoxemic respiratory failure due to COVID-19 [facility]) Active Problems:   Essential hypertension   Hyperlipidemia   Type 2 diabetes mellitus ([place])  Acute hypoxic failure secondary to COVID-19 pneumonia.  Completed a course of remdesivir.  He was given Actemra X I. Continue to remain hypoxic requiring 3-4 L. Lower extremity Doppler was positive for thrombus in right posterior tibial and peroneal vein.  D-dimer remained stable above 4000.  Rest of the inflammatory markers improving. -Check CTA to rule out PE. -Start him on Xarelto for DVT. -Continue steroid for another 5 days. -Continue zinc and vitamin C -Continue incentive spirometry.  Hypertension.  Blood pressure was within goal this morning and his morning dose of Lopressor was held due to concern of bradycardia. -Continue to monitor. -Continue Lopressor if heart rate within normal limit. -Can use hydralazine as needed for systolic above 662.  Type 2 diabetes mellitus.  Well-controlled with A1c of 6.1 And had some elevated  blood sugar levels most likely secondary to steroid. -Continue to monitor and SSI.  Acute urinary retention.  Unclear etiology, most likely secondary to BPH. Urine with Foley catheter-we will go home with Foley and follow-up with urology as an outpatient. -Continue Flomax.  Objective: Vitals:   10/25/19 1208 10/25/19 1624 10/25/19 1953 10/26/19 0335  BP: (!) 95/58 138/62 (!) 143/62 122/62  Pulse: (!) 59 62 62 66  Resp: 18 16    Temp: (!) 97.4 F (36.3 C) (!) 97.4 F (36.3 C) 97.6 F (36.4 C) 98.8 F (37.1 C)  TempSrc: Oral Oral Oral Oral  SpO2: (!) 87% 93% 95% 96%  Weight:      Height:        Intake/Output Summary (Last 24 hours) at 10/26/2019 0833 Last data filed at 10/26/2019 0739 Gross per 24 hour  Intake 9.74 ml  Output 1525 ml  Net -1515.26 ml   Filed Weights   10/20/19 1724 10/20/19 2331 10/22/19 0532  Weight: 105.2 kg 99.9 kg 98.7 kg    Examination:  General exam: Appears calm and comfortable  Respiratory system: Clear to auscultation. Respiratory effort normal. Cardiovascular system: S1 & S2 heard, RRR. No JVD, murmurs, rubs, gallops or clicks. Gastrointestinal system: Soft, nontender, nondistended, bowel sounds positive. Central nervous system: Alert and oriented. No focal neurological deficits.Symmetric 5 x 5 power. Extremities: No edema, no cyanosis, pulses intact and symmetrical. Skin: No rashes, lesions or ulcers Psychiatry: Judgement and insight appear normal. Mood & affect appropriate.    DVT prophylaxis: Xarelto Code Status: Full Family Communication: Wife was updated on phone. Disposition Plan: Pending improvement, most likely next couple of days.  Might go home with home oxygen if fail to wean off.  Consultants:   None  Procedures:  Antimicrobials:   Data Reviewed: I have personally reviewed following labs and imaging studies  CBC: Recent Labs  Lab 10/21/19 0637 10/22/19 0734 10/23/19 0644 10/24/19 0433 10/25/19 0731  WBC 6.4 13.0*  13.8* 12.8* 16.2*  NEUTROABS 5.8 11.7* 12.5* 10.6* 14.7*  HGB 13.0 14.1 14.1 13.5 14.3  HCT 38.4* 41.7 41.2 39.2 42.6  MCV 85.9 84.9 86.0 85.2 86.6  PLT 221 302 318 310 271   Basic Metabolic Panel: Recent Labs  Lab 10/21/19 0637 10/22/19 0734 10/23/19 0644 10/24/19 0433 10/25/19 0731  NA 137 140 140 141 139  K 3.4* 3.6 3.7 3.3* 4.7  CL 107 107 109 108 108  CO2 20* 22 22 25 22   GLUCOSE 178* 202* 204* 142* 248*  BUN 24* 26* 26* 25* 29*  CREATININE 0.81 0.92 0.94 0.95 1.01  CALCIUM 7.3* 7.7* 7.8* 7.5* 7.6*  MG 2.2 2.1 2.2 2.1 2.0  PHOS 2.5 3.0 2.8 2.9 3.5   GFR: Estimated Creatinine Clearance: 69.7 mL/min (by C-G formula based on SCr of 1.01 mg/dL). Liver Function Tests: Recent Labs  Lab 10/21/19 0637 10/22/19 0734 10/23/19 0644 10/24/19 0433 10/25/19 0731  AST 32 30 37 69* 42*  ALT 19 19 24  40 40  ALKPHOS 48 52 55 58 58  BILITOT 0.8 0.8 0.8 0.7 0.6  PROT 5.9* 6.1* 6.0* 5.2* 5.4*  ALBUMIN 2.3* 2.3* 2.3* 2.1* 2.3*   No results for input(s): LIPASE, AMYLASE in the last 168 hours. No results for input(s): AMMONIA in the last 168 hours. Coagulation Profile: No results for input(s): INR, PROTIME in the last 168 hours. Cardiac Enzymes: No results for input(s): CKTOTAL, CKMB, CKMBINDEX, TROPONINI in the last 168 hours. BNP (last 3 results) No results for input(s): PROBNP in the last 8760 hours. HbA1C: No results for input(s): HGBA1C in the last 72 hours. CBG: Recent Labs  Lab 10/24/19 2007 10/25/19 0831 10/25/19 1207 10/25/19 1626 10/25/19 2040  GLUCAP 116* 199* 199* 151* 130*   Lipid Profile: No results for input(s): CHOL, HDL, LDLCALC, TRIG, CHOLHDL, LDLDIRECT in the last 72 hours. Thyroid Function Tests: No results for input(s): TSH, T4TOTAL, FREET4, T3FREE, THYROIDAB in the last 72 hours. Anemia Panel: Recent Labs    10/24/19 0433 10/25/19 0731  FERRITIN 2,223* 2,081*   Sepsis Labs: Recent Labs  Lab 10/20/19 1730 10/20/19 1900  PROCALCITON 0.21   --   LATICACIDVEN 3.7* 2.5*    Recent Results (from the past 240 hour(s))  Respiratory Panel by RT PCR (Flu A&B, Covid) - Nasopharyngeal Swab     Status: Abnormal   Collection Time: 10/20/19  5:30 PM   Specimen: Nasopharyngeal Swab  Result Value Ref Range Status   SARS Coronavirus 2 by RT PCR POSITIVE (A) NEGATIVE Final    Comment: RESULT CALLED TO, READ BACK BY AND VERIFIED WITH: BRIANNA CHAPMON @1900  10/20/19 MJU (NOTE) SARS-CoV-2 target nucleic acids are DETECTED. SARS-CoV-2 RNA is generally detectable in upper respiratory specimens  during the acute phase of infection. Positive results are indicative of the presence of the identified virus, but do not rule out bacterial infection or co-infection with other pathogens not detected by the test. Clinical correlation with patient history and other diagnostic information is necessary to determine patient infection status. The expected result is Negative. Fact Sheet for Patients:  10/22/19 Fact Sheet for Healthcare Providers: This test is not yet approved or cleared by the 10/22/19 FDA and  has been authorized for detection and/or diagnosis of SARS-CoV-2 by FDA under  an Emergency Use Authorization (EUA).  This EUA will remain in effect (meaning this test can be used) for  the duration of  the COVID-19 declaration under Section 564(b)(1) of the Act, 21 U.S.C. section 360bbb-3(b)(1), unless the authorization is terminated or revoked sooner.    Influenza A by PCR NEGATIVE NEGATIVE Final   Influenza B by PCR NEGATIVE NEGATIVE Final    Comment: (NOTE) The Xpert Xpress SARS-CoV-2/FLU/RSV assay is intended as an aid in  the diagnosis of influenza from Nasopharyngeal swab specimens and  should not be used as a sole basis for treatment. Nasal washings and  aspirates are unacceptable for Xpert Xpress SARS-CoV-2/FLU/RSV  testing. Fact Sheet for Patients:  https://www.moore.com/ Fact Sheet for Healthcare Providers: https://www.young.biz/ This test is not yet approved or cleared by the [place] FDA and  has been authorized for detection and/or diagnosis of SARS-CoV-2 by  FDA under an Emergency Use Authorization (EUA). This EUA will remain  in effect (meaning this test can be used) for the duration of the  Covid-19 declaration under Section 564(b)(1) of the Act, 21  U.S.C. section 360bbb-3(b)(1), unless the authorization is  terminated or revoked. Performed at [facility], [address] [place], [place] [address]      Radiology Studies: No results found.  Scheduled Meds: . vitamin C  500 mg Oral Daily  . dexamethasone (DECADRON) injection  6 mg Intravenous Q24H  . enoxaparin (LOVENOX) injection  40 mg Subcutaneous Q24H  . famotidine  20 mg Oral BID  . insulin aspart  0-5 Units Subcutaneous QHS  . insulin aspart  0-9 Units Subcutaneous TID WC  . Ipratropium-Albuterol  1 puff Inhalation Q6H  . metoprolol tartrate  50 mg Oral BID  . sodium chloride flush  3 mL Intravenous Q12H  . tamsulosin  0.4 mg Oral Daily  . zinc sulfate  220 mg Oral Daily   Continuous Infusions: . sodium chloride Stopped (10/24/19 1900)     LOS: 6 days   Time spent: 45 minutes.  Arnetha Courser, MD [facility]  If 7PM-7AM, please contact night-coverage 10/26/2019, 8:33 AM   This record has been created using Dragon voice recognition software. Errors have been sought and corrected,but may not always be located. Such creation errors do not reflect on the standard of care.

## 2019-10-27 LAB — GLUCOSE, CAPILLARY
Glucose-Capillary: 222 mg/dL — ABNORMAL HIGH (ref 70–99)
Glucose-Capillary: 167 mg/dL — ABNORMAL HIGH (ref 70–99)
Glucose-Capillary: 125 mg/dL — ABNORMAL HIGH (ref 70–99)
Glucose-Capillary: 139 mg/dL — ABNORMAL HIGH (ref 70–99)

## 2019-10-27 NOTE — Progress Notes (Signed)
Physical Therapy Treatment Patient Details Name: Walter Baxter MRN: [id number] DOB: 1941-07-14 Today's Date: 10/27/2019    History of Present Illness presented to ER secondary to SOB, generalized weakness; admitted for management of acute respiratory failure related to COVID-19 PNA.    PT Comments    Pt was long sitting in bed upon arriving. Per RN, pt slightly confused/disoriented this AM however pt is A and O x 4 during PT session. Pt on 4 L o2 nasal upon arriving with O2 92%. He agrees to PT and OOB activity. Pt desats to 84% with exiting bed and getting to recliner. He required supervision to achieve EOB sitting and CGA to stand and take 3 steps to recliner HHA +1. Pt very fatigued with minimal activity. With prolong sitting rest and breathing techniques, was able to return to 90s% on 4 L. RN aware. Pt requested no further ambulation or exercise 2/2 to fatigue. He was repositioned in recliner post session with call bell in reach, chair alarm set, and BLEs elevated.  Lengthy discussion with pt about discharge disposition. Pt does not feel he can safely return home at this time. He states his wife is disabled and unable to care for him. Therapist discussed that depending on progress with therapy, recommendations can change. Pt mostly limited by fatigue versus strength/ safe mobility. PT will continue to follow per POC.     Follow Up Recommendations  SNF;Home health PT;Supervision/Assistance - 24 hour     Equipment Recommendations  Rolling walker with 5" wheels    Recommendations for Other Services       Precautions / Restrictions Precautions Precautions: Fall Restrictions Weight Bearing Restrictions: No    Mobility  Bed Mobility Overal bed mobility: Needs Assistance Bed Mobility: Supine to Sit Rolling: Supervision Sidelying to sit: Supervision;HOB elevated(HOB only slightly elevated) Supine to sit: Supervision     General bed mobility comments: Pt required increased time to  perform all mobility however was able to perform withoutlifting assistance. Occasional cue for improved technique and sequencing  Transfers Overall transfer level: Needs assistance Equipment used: None;1 person hand held assist Transfers: Sit to/from Stand Sit to Stand: Min guard;From elevated surface         General transfer comment: Pt was able to stand at EOB with RUE HHA + gait belt for safety. bed hight slightly elevated. Pt on 4 L O2 nasal cannula throughout session. Pt desats with standing to 84%. with prolonged (~ 4 minut) rest recovers to 90% with staying on 4 L o2  Ambulation/Gait Ambulation/Gait assistance: Min guard(HHA RUE) Gait Distance (Feet): 3 Feet Assistive device: 1 person hand held assist Gait Pattern/deviations: WFL(Within Functional Limits)     General Gait Details: Pt was able to stand from EOB and take several ~ 3 steps to get to recliner.  He fatigued extrememly quickly with desat to 84%. Pt unwilling to trial gait further distances 2/2 to fatigue.   Stairs             Wheelchair Mobility    Modified Rankin (Stroke Patients Only)       Balance Overall balance assessment: Needs assistance Sitting-balance support: Feet supported;No upper extremity supported Sitting balance-Leahy Scale: Good Sitting balance - Comments: NO LOB noted while sitting EOB   Standing balance support: Single extremity supported(RUE HHA) Standing balance-Leahy Scale: Fair Standing balance comment: pt unsteady on feet. recommend use of RW next session. no walker in current room but pt is being transferred to first floor later this date  Cognition Arousal/Alertness: Awake/alert Behavior During Therapy: WFL for tasks assessed/performed Overall Cognitive Status: Within Functional Limits for tasks assessed                                 General Comments: Pt was A and O x 4 however RN reports some confusion earlier this  morning.      Exercises Other Exercises Other Exercises: Breathing exercises performed when pt desat to 84%. Pt was educated on importance of performing ther ex throughout the day when in bed/recliner to promote BLE strengthening    General Comments        Pertinent Vitals/Pain Pain Assessment: No/denies pain Pain Score: 0-No pain Pain Intervention(s): Limited activity within patient's tolerance    Home Living                      Prior Function            PT Goals (current goals can now be found in the care plan section) Acute Rehab PT Goals Patient Stated Goal: to return home Progress towards PT goals: Progressing toward goals    Frequency    Min 2X/week      PT Plan Current plan remains appropriate    Co-evaluation              AM-PAC PT "6 Clicks" Mobility   Outcome Measure  Help needed turning from your back to your side while in a flat bed without using bedrails?: None Help needed moving from lying on your back to sitting on the side of a flat bed without using bedrails?: A Little Help needed moving to and from a bed to a chair (including a wheelchair)?: A Little Help needed standing up from a chair using your arms (e.g., wheelchair or bedside chair)?: A Little Help needed to walk in hospital room?: A Little Help needed climbing 3-5 steps with a railing? : A Little 6 Click Score: 19    End of Session Equipment Utilized During Treatment: Oxygen Activity Tolerance: Patient limited by fatigue Patient left: in chair;with chair alarm set;with call bell/phone within reach Nurse Communication: Mobility status PT Visit Diagnosis: Difficulty in walking, not elsewhere classified (R26.2);Muscle weakness (generalized) (M62.81)     Time: 1010-1030 PT Time Calculation (min) (ACUTE ONLY): 20 min  Charges:  $Therapeutic Activity: 8-22 mins                     Jetta Lout PTA 10/27/19, 11:25 AM

## 2019-10-27 NOTE — TOC Progression Note (Signed)
Transition of Care [facility]) - Progression Note    Patient Details  Name: Walter Baxter MRN: [id number] Date of Birth: Mar 24, 1941  Transition of Care [facility]) CM/SW Contact  Maree Krabbe, LCSW Phone Number: 10/27/2019, 1:06 PM  Clinical Narrative:   Pt spoke with pt about SNF. Pt ask that CSW contact pt's spouse. CSW spoke with pt's spouse via telephone. Pt's spouse states pt will not go to SNF he will need to come home. Pt's spouse states she will have someone there to assist in pt's care.    Expected Discharge Plan: Home w Home Health Services Barriers to Discharge: Continued Medical Work up  Expected Discharge Plan and Services Expected Discharge Plan: Home w Home Health Services     Post Acute Care Choice: Home Health Living arrangements for the past 2 months: Single Family Home                 DME Arranged: Dan Humphreys DME Agency: [employer] Date DME Agency Contacted: 10/25/19 Time DME Agency Contacted: 1255 Representative spoke with at DME Agency: Nida Boatman HH Arranged: PT, OT HH Agency: [facility] Date HH Agency Contacted: 10/25/19 Time HH Agency Contacted: 1256 Representative spoke with at [facility] Agency: Barbara Cower   Social Determinants of Health (SDOH) Interventions    Readmission Risk Interventions No flowsheet data found.

## 2019-10-27 NOTE — Progress Notes (Addendum)
PROGRESS NOTE    Walter Baxter  ZOX:[id number] DOB: [birth year] DOA: 10/20/2019 PCP: Lupita Raider, MD   Brief Narrative:  Walter Baxter a 79 y.o.malewith medical history significant fordiet-controlled diabetes, hypertension, and hyperlipidemia who presents to the ED for evaluation of shortness of breath.Patient reports new sudden onset of significant generalized weakness, fatigue, and shortness of breath beginning the night before admission. He has had associated intermittent fevers, chills, loss of taste/smell, and rare nonproductive cough. Admitted for COVID-19 pneumonia, treated with remdesivir, Decadron and Actemra.  Continue to require O2 supplement Wife was also admitted for COVID-19 infection.  Subjective: Patient was feeling better when seen this morning.  He was sitting comfortably in the chair.  We discussed that he might have to go home with oxygen and he seems understanding.  Assessment & Plan:   Principal Problem:   Acute hypoxemic respiratory failure due to COVID-19 [facility]) Active Problems:   Essential hypertension   Hyperlipidemia   Type 2 diabetes mellitus (HCC)  Acute hypoxic failure secondary to COVID-19 pneumonia.  Completed a course of remdesivir.  He was given Actemra X I. Continue to remain hypoxic requiring 3 L. Lower extremity Doppler was positive for thrombus in right posterior tibial and peroneal vein.  D-dimer remained stable above 4000.  Rest of the inflammatory markers improving. - CTA was negative for PE. -Start him on Xarelto for DVT. -Continue steroid for another 5 days. -Continue zinc and vitamin C -Continue incentive spirometry. -Might have to go home with home oxygen. -PT/OT recommending SNF placement but patient and wife decided to go back home with home health services.  Hypertension.  Blood pressure was within goal. -Continue to monitor. -Continue Lopressor if heart rate within normal limit. -Can use hydralazine as needed for  systolic above 160.  Type 2 diabetes mellitus.  Well-controlled with A1c of 6.1 And had some elevated blood sugar levels most likely secondary to steroid. -Continue to monitor and SSI.  Acute urinary retention.  Unclear etiology, most likely secondary to BPH. Has been resolved.  Patient is urinating without any difficulty. -Continue Flomax.  Objective: Vitals:   10/26/19 2157 10/27/19 0626 10/27/19 0812 10/27/19 1200  BP:  119/87 132/67 (!) 118/53  Pulse: 65 66 60 (!) 54  Resp:  16 18 19   Temp:  97.6 F (36.4 C) 97.9 F (36.6 C) 98.2 F (36.8 C)  TempSrc:  Oral  Oral  SpO2:  93% 92% 95%  Weight:      Height:        Intake/Output Summary (Last 24 hours) at 10/27/2019 1603 Last data filed at 10/27/2019 0900 Gross per 24 hour  Intake --  Output 825 ml  Net -825 ml   Filed Weights   10/20/19 1724 10/20/19 2331 10/22/19 0532  Weight: 105.2 kg 99.9 kg 98.7 kg    Examination:  General exam: Appears calm and comfortable  Respiratory system: Clear to auscultation. Respiratory effort normal. Cardiovascular system: S1 & S2 heard, RRR. No JVD, murmurs, rubs, gallops or clicks. Gastrointestinal system: Soft, nontender, nondistended, bowel sounds positive. Central nervous system: Alert and oriented. No focal neurological deficits.Symmetric 5 x 5 power. Extremities: No edema, no cyanosis, pulses intact and symmetrical. Skin: No rashes, lesions or ulcers Psychiatry: Judgement and insight appear normal.     DVT prophylaxis: Xarelto Code Status: Full Family Communication: Wife was updated on phone. Disposition Plan: Pending improvement, most likely next couple of days.  Might go home with home oxygen if fail to wean off.  He  will go back home with home health services.  Consultants:   None  Procedures:  Antimicrobials:   Data Reviewed: I have personally reviewed following labs and imaging studies  CBC: Recent Labs  Lab 10/21/19 0637 10/22/19 0734 10/23/19 0644  10/24/19 0433 10/25/19 0731  WBC 6.4 13.0* 13.8* 12.8* 16.2*  NEUTROABS 5.8 11.7* 12.5* 10.6* 14.7*  HGB 13.0 14.1 14.1 13.5 14.3  HCT 38.4* 41.7 41.2 39.2 42.6  MCV 85.9 84.9 86.0 85.2 86.6  PLT 221 302 318 310 271   Basic Metabolic Panel: Recent Labs  Lab 10/21/19 0637 10/21/19 0637 10/22/19 0734 10/23/19 0644 10/24/19 0433 10/25/19 0731 10/26/19 0841  NA 137   < > 140 140 141 139 138  K 3.4*   < > 3.6 3.7 3.3* 4.7 4.4  CL 107   < > 107 109 108 108 107  CO2 20*   < > 22 22 25 22  21*  GLUCOSE 178*   < > 202* 204* 142* 248* 201*  BUN 24*   < > 26* 26* 25* 29* 29*  CREATININE 0.81   < > 0.92 0.94 0.95 1.01 0.96  CALCIUM 7.3*   < > 7.7* 7.8* 7.5* 7.6* 7.6*  MG 2.2  --  2.1 2.2 2.1 2.0  --   PHOS 2.5  --  3.0 2.8 2.9 3.5  --    < > = values in this interval not displayed.   GFR: Estimated Creatinine Clearance: 73.4 mL/min (by C-G formula based on SCr of 0.96 mg/dL). Liver Function Tests: Recent Labs  Lab 10/21/19 0637 10/22/19 0734 10/23/19 0644 10/24/19 0433 10/25/19 0731  AST 32 30 37 69* 42*  ALT 19 19 24  40 40  ALKPHOS 48 52 55 58 58  BILITOT 0.8 0.8 0.8 0.7 0.6  PROT 5.9* 6.1* 6.0* 5.2* 5.4*  ALBUMIN 2.3* 2.3* 2.3* 2.1* 2.3*   No results for input(s): LIPASE, AMYLASE in the last 168 hours. No results for input(s): AMMONIA in the last 168 hours. Coagulation Profile: No results for input(s): INR, PROTIME in the last 168 hours. Cardiac Enzymes: No results for input(s): CKTOTAL, CKMB, CKMBINDEX, TROPONINI in the last 168 hours. BNP (last 3 results) No results for input(s): PROBNP in the last 8760 hours. HbA1C: No results for input(s): HGBA1C in the last 72 hours. CBG: Recent Labs  Lab 10/26/19 1140 10/26/19 1553 10/26/19 2034 10/27/19 0809 10/27/19 1218  GLUCAP 187* 124* 145* 222* 167*   Lipid Profile: No results for input(s): CHOL, HDL, LDLCALC, TRIG, CHOLHDL, LDLDIRECT in the last 72 hours. Thyroid Function Tests: No results for input(s): TSH,  T4TOTAL, FREET4, T3FREE, THYROIDAB in the last 72 hours. Anemia Panel: Recent Labs    10/25/19 0731  FERRITIN 2,081*   Sepsis Labs: Recent Labs  Lab 10/20/19 1730 10/20/19 1900 10/26/19 0841  PROCALCITON 0.21  --  <0.10  LATICACIDVEN 3.7* 2.5*  --     Recent Results (from the past 240 hour(s))  Respiratory Panel by RT PCR (Flu A&B, Covid) - Nasopharyngeal Swab     Status: Abnormal   Collection Time: 10/20/19  5:30 PM   Specimen: Nasopharyngeal Swab  Result Value Ref Range Status   SARS Coronavirus 2 by RT PCR POSITIVE (A) NEGATIVE Final    Comment: RESULT CALLED TO, READ BACK BY AND VERIFIED WITH: BRIANNA CHAPMON @1900  10/20/19 MJU (NOTE) SARS-CoV-2 target nucleic acids are DETECTED. SARS-CoV-2 RNA is generally detectable in upper respiratory specimens  during the acute phase of infection. Positive results are indicative  of the presence of the identified virus, but do not rule out bacterial infection or co-infection with other pathogens not detected by the test. Clinical correlation with patient history and other diagnostic information is necessary to determine patient infection status. The expected result is Negative. Fact Sheet for Patients:  https://www.moore.com/ Fact Sheet for Healthcare Providers: https://www.young.biz/ This test is not yet approved or cleared by the [place] FDA and  has been authorized for detection and/or diagnosis of SARS-CoV-2 by FDA under an Emergency Use Authorization (EUA).  This EUA will remain in effect (meaning this test can be used) for  the duration of  the COVID-19 declaration under Section 564(b)(1) of the Act, 21 U.S.C. section 360bbb-3(b)(1), unless the authorization is terminated or revoked sooner.    Influenza A by PCR NEGATIVE NEGATIVE Final   Influenza B by PCR NEGATIVE NEGATIVE Final    Comment: (NOTE) The Xpert Xpress SARS-CoV-2/FLU/RSV assay is intended as an aid in  the  diagnosis of influenza from Nasopharyngeal swab specimens and  should not be used as a sole basis for treatment. Nasal washings and  aspirates are unacceptable for Xpert Xpress SARS-CoV-2/FLU/RSV  testing. Fact Sheet for Patients: https://www.moore.com/ Fact Sheet for Healthcare Providers: https://www.young.biz/ This test is not yet approved or cleared by the [place] FDA and  has been authorized for detection and/or diagnosis of SARS-CoV-2 by  FDA under an Emergency Use Authorization (EUA). This EUA will remain  in effect (meaning this test can be used) for the duration of the  Covid-19 declaration under Section 564(b)(1) of the Act, 21  U.S.C. section 360bbb-3(b)(1), unless the authorization is  terminated or revoked. Performed at [facility], [address] [place], [place] [address]      Radiology Studies: CT ANGIO CHEST PE W OR WO CONTRAST  Result Date: 10/26/2019 CLINICAL DATA:  Shortness of breath, COVID positive EXAM: CT ANGIOGRAPHY CHEST WITH CONTRAST TECHNIQUE: Multidetector CT imaging of the chest was performed using the standard protocol during bolus administration of intravenous contrast. Multiplanar CT image reconstructions and MIPs were obtained to evaluate the vascular anatomy. CONTRAST:  62mL OMNIPAQUE IOHEXOL 350 MG/ML SOLN COMPARISON:  None. FINDINGS: Cardiovascular: Satisfactory opacification of the pulmonary arteries to the proximal segmental level. No evidence of pulmonary embolism. Normal heart size. No pericardial effusion. Coronary artery and thoracic aorta atherosclerotic calcification. Mediastinum/Nodes: No mediastinal, hilar, or axillary adenopathy. Thyroid is unremarkable. Esophagus appears normal. Lungs/Pleura: There is multifocal consolidation greatest at the periphery and lung bases. No pleural effusion or pneumothorax. Upper Abdomen: No acute abnormality. Musculoskeletal: No chest wall abnormality. No acute  or significant osseous findings. Review of the MIP images confirms the above findings. IMPRESSION: No evidence of acute pulmonary embolism. Bilateral consolidative opacities likely reflecting pneumonia (including COVID-19). Aortic Atherosclerosis (ICD10-I70.0). Electronically Signed   By: Guadlupe Spanish M.D.   On: 10/26/2019 17:03   US Venous Img Lower Bilateral (DVT)  Result Date: 10/26/2019 CLINICAL DATA:  Lower extremity edema for 1 day EXAM: BILATERAL LOWER EXTREMITY VENOUS DOPPLER ULTRASOUND TECHNIQUE: Gray-scale sonography with graded compression, as well as color Doppler and duplex ultrasound were performed to evaluate the lower extremity deep venous systems from the level of the common femoral vein and including the common femoral, femoral, profunda femoral, popliteal and calf veins including the posterior tibial, peroneal and gastrocnemius veins when visible. The superficial great saphenous vein was also interrogated. Spectral Doppler was utilized to evaluate flow at rest and with distal augmentation maneuvers in the common femoral, femoral and  popliteal veins. COMPARISON:  None. FINDINGS: RIGHT LOWER EXTREMITY Common Femoral Vein: No evidence of thrombus. Normal compressibility, respiratory phasicity and response to augmentation. Saphenofemoral Junction: No evidence of thrombus. Normal compressibility and flow on color Doppler imaging. Profunda Femoral Vein: No evidence of thrombus. Normal compressibility and flow on color Doppler imaging. Femoral Vein: No evidence of thrombus. Normal compressibility, respiratory phasicity and response to augmentation. Popliteal Vein: No evidence of thrombus. Normal compressibility, respiratory phasicity and response to augmentation. Calf Veins: Calf posterior tibial and peroneal veins demonstrate intraluminal thrombus appearing occlusive in the posterior tibial vein and nonocclusive in the peroneal vein. Vessels are noncompressible. Very low thrombus burden. LEFT LOWER  EXTREMITY Common Femoral Vein: No evidence of thrombus. Normal compressibility, respiratory phasicity and response to augmentation. Saphenofemoral Junction: No evidence of thrombus. Normal compressibility and flow on color Doppler imaging. Profunda Femoral Vein: No evidence of thrombus. Normal compressibility and flow on color Doppler imaging. Femoral Vein: No evidence of thrombus. Normal compressibility, respiratory phasicity and response to augmentation. Popliteal Vein: No evidence of thrombus. Normal compressibility, respiratory phasicity and response to augmentation. Calf Veins: No evidence of thrombus. Normal compressibility and flow on color Doppler imaging. IMPRESSION: Positive exam for calf region right posterior tibial and peroneal vein DVT. Very low thrombus burden. No propagation into the popliteal or femoral veins. Negative for left lower extremity DVT. Electronically Signed   By: Jerilynn Mages.  Shick M.D.   On: 10/26/2019 11:54    Scheduled Meds: . vitamin C  500 mg Oral Daily  . dexamethasone (DECADRON) injection  6 mg Intravenous Q24H  . famotidine  20 mg Oral BID  . insulin aspart  0-5 Units Subcutaneous QHS  . insulin aspart  0-9 Units Subcutaneous TID WC  . Ipratropium-Albuterol  1 puff Inhalation Q6H  . metoprolol tartrate  50 mg Oral BID  . rivaroxaban  15 mg Oral BID AC   Followed by  . [START ON 11/16/2019] rivaroxaban  20 mg Oral Q supper  . sodium chloride flush  3 mL Intravenous Q12H  . tamsulosin  0.4 mg Oral Daily  . zinc sulfate  220 mg Oral Daily   Continuous Infusions: . sodium chloride Stopped (10/24/19 1900)     LOS: 7 days   Time spent: 40 minutes.  Lorella Nimrod, MD [facility]  If 7PM-7AM, please contact night-coverage 10/27/2019, 4:03 PM   This record has been created using Dragon voice recognition software. Errors have been sought and corrected,but may not always be located. Such creation errors do not reflect on the standard of care.

## 2019-10-27 NOTE — NC FL2 (Signed)
[facility] [facility] LEVEL OF CARE SCREENING TOOL     IDENTIFICATION  Patient Name: Walter Baxter Birthdate: [birth year] Sex: male Admission Date (Current Location): 10/20/2019  [place] and [place] Number:  Engineering geologist and Address:  [facility], [address] [place], [place] [address]      Provider Number: [id number]  Attending Physician Name and Address:  Lorella Nimrod, MD  Relative Name and Phone Number:       Current Level of Care: Hospital Recommended Level of Care: [place] Prior Approval Number:    Date Approved/Denied:   PASRR Number: [id number]  Discharge Plan: SNF    Current Diagnoses: Patient Active Problem List   Diagnosis Date Noted  . Acute hypoxemic respiratory failure due to COVID-19 ([place]) 10/20/2019  . Essential hypertension   . Hyperlipidemia   . Type 2 diabetes mellitus (HCC)     Orientation RESPIRATION BLADDER Height & Weight     Self, Time, Place  O2(4L 02) Continent Weight: 217 lb 8 oz (98.7 kg) Height:  5\' 8"  (172.7 cm)  BEHAVIORAL SYMPTOMS/MOOD NEUROLOGICAL BOWEL NUTRITION STATUS      Continent Diet  AMBULATORY STATUS COMMUNICATION OF NEEDS Skin   Limited Assist Verbally Normal                       Personal Care Assistance Level of Assistance  Bathing, Dressing, Feeding Bathing Assistance: Limited assistance Feeding assistance: Independent Dressing Assistance: Limited assistance     Functional Limitations Info  Sight, Speech, Hearing Sight Info: Adequate Hearing Info: Adequate Speech Info: Adequate    SPECIAL CARE FACTORS FREQUENCY  PT (By licensed PT), OT (By licensed OT)     PT Frequency: 5x OT Frequency: 5x            Contractures Contractures Info: Not present    Additional Factors Info  Code Status, Isolation Precautions, Allergies Code Status Info: Full Code Allergies Info: other     Isolation Precautions Info: COVID+     Current Medications  (10/27/2019):  This is the current hospital active medication list Current Facility-Administered Medications  Medication Dose Route Frequency Provider Last Rate Last Admin  . 0.9 %  sodium chloride infusion   Intravenous PRN Lavina Hamman, MD   Stopped at 10/24/19 1900  . acetaminophen (TYLENOL) tablet 650 mg  650 mg Oral Q6H PRN Lenore Cordia, MD   650 mg at 10/20/19 2058  . ascorbic acid (VITAMIN C) tablet 500 mg  500 mg Oral Daily Zada Finders R, MD   500 mg at 10/26/19 2201  . chlorpheniramine-HYDROcodone (TUSSIONEX) 10-8 MG/5ML suspension 5 mL  5 mL Oral Q12H PRN Zada Finders R, MD      . dexamethasone (DECADRON) injection 6 mg  6 mg Intravenous Q24H Zada Finders R, MD   6 mg at 10/26/19 2200  . famotidine (PEPCID) tablet 20 mg  20 mg Oral BID Lenore Cordia, MD   20 mg at 10/27/19 0853  . guaiFENesin-dextromethorphan (ROBITUSSIN DM) 100-10 MG/5ML syrup 10 mL  10 mL Oral Q4H PRN Zada Finders R, MD      . insulin aspart (novoLOG) injection 0-5 Units  0-5 Units Subcutaneous QHS Lavina Hamman, MD   3 Units at 10/22/19 2211  . insulin aspart (novoLOG) injection 0-9 Units  0-9 Units Subcutaneous TID WC Lavina Hamman, MD   2 Units at 10/27/19 1235  . Ipratropium-Albuterol (COMBIVENT) respimat 1 puff  1 puff  Inhalation Q6H Charlsie Quest, MD   1 puff at 10/27/19 1236  . metoprolol tartrate (LOPRESSOR) tablet 50 mg  50 mg Oral BID Charlsie Quest, MD   50 mg at 10/27/19 0853  . ondansetron (ZOFRAN) tablet 4 mg  4 mg Oral Q6H PRN Charlsie Quest, MD       Or  . ondansetron (ZOFRAN) injection 4 mg  4 mg Intravenous Q6H PRN Charlsie Quest, MD      . Rivaroxaban (XARELTO) tablet 15 mg  15 mg Oral BID AC Arnetha Courser, MD   15 mg at 10/27/19 0853   Followed by  . [START ON 11/16/2019] rivaroxaban (XARELTO) tablet 20 mg  20 mg Oral Q supper Arnetha Courser, MD      . sodium chloride flush (NS) 0.9 % injection 3 mL  3 mL Intravenous Q12H Charlsie Quest, MD   3 mL at 10/27/19 0854  . tamsulosin  (FLOMAX) capsule 0.4 mg  0.4 mg Oral Daily Rolly Salter, MD   0.4 mg at 10/27/19 0854  . zinc sulfate capsule 220 mg  220 mg Oral Daily Charlsie Quest, MD   220 mg at 10/26/19 2200     Discharge Medications: Please see discharge summary for a list of discharge medications.  Relevant Imaging Results:  Relevant Lab Results:   Additional Information SSN:[id number]  Reuel Boom Zylie Mumaw, LCSW

## 2019-10-28 ENCOUNTER — Inpatient Hospital Stay: Payer: Medicare Other

## 2019-10-28 LAB — BASIC METABOLIC PANEL
Anion gap: 9 (ref 5–15)
BUN: 31 mg/dL — ABNORMAL HIGH (ref 8–23)
CO2: 22 mmol/L (ref 22–32)
Calcium: 7.8 mg/dL — ABNORMAL LOW (ref 8.9–10.3)
Chloride: 107 mmol/L (ref 98–111)
Creatinine, Ser: 0.96 mg/dL (ref 0.61–1.24)
GFR calc Af Amer: >60 mL/min (ref >60)
GFR calc non Af Amer: >60 mL/min (ref >60)
Glucose, Bld: 221 mg/dL — ABNORMAL HIGH (ref 70–99)
Potassium: 4.8 mmol/L (ref 3.5–5.1)
Sodium: 138 mmol/L (ref 135–145)

## 2019-10-28 LAB — GLUCOSE, CAPILLARY
Glucose-Capillary: 207 mg/dL — ABNORMAL HIGH (ref 70–99)
Glucose-Capillary: 167 mg/dL — ABNORMAL HIGH (ref 70–99)
Glucose-Capillary: 276 mg/dL — ABNORMAL HIGH (ref 70–99)

## 2019-10-28 LAB — FIBRIN DERIVATIVES D-DIMER (ARMC ONLY): Fibrin derivatives D-dimer (ARMC): 3480.63 ng/mL (FEU) — ABNORMAL HIGH (ref 0.00–499.00)

## 2019-10-28 LAB — CBC
HCT: 44.3 % (ref 39.0–52.0)
Hemoglobin: 15 g/dL (ref 13.0–17.0)
MCH: 29.9 pg (ref 26.0–34.0)
MCHC: 33.9 g/dL (ref 30.0–36.0)
MCV: 88.4 fL (ref 80.0–100.0)
Platelets: 242 10*3/uL (ref 150–400)
RBC: 5.01 MIL/uL (ref 4.22–5.81)
RDW: 14.2 % (ref 11.5–15.5)
WBC: 16.7 10*3/uL — ABNORMAL HIGH (ref 4.0–10.5)
nRBC: 0 % (ref 0.0–0.2)

## 2019-10-28 LAB — C-REACTIVE PROTEIN: CRP: 0.6 mg/dL (ref <1.0)

## 2019-10-28 MED ORDER — METHYLPREDNISOLONE SODIUM SUCC 125 MG IJ SOLR
60.0000 mg | Freq: Every day | INTRAMUSCULAR | Status: DC
  Administered 2019-10-28: 60 mg via INTRAVENOUS
  Filled 2019-10-28: qty 2

## 2019-10-28 MED ORDER — ACETYLCYSTEINE 20 % IN SOLN: 4.0000 mL | Freq: Two times a day (BID) | RESPIRATORY_TRACT | Status: DC

## 2019-10-28 MED ORDER — COLCHICINE 0.6 MG PO TABS
0.6000 mg | ORAL_TABLET | Freq: Every day | ORAL | Status: DC
  Administered 2019-10-28 – 2019-11-03 (×7): 0.6 mg via ORAL
  Filled 2019-10-28 (×7): qty 1

## 2019-10-28 MED ORDER — METHYLPREDNISOLONE SODIUM SUCC 40 MG IJ SOLR
40.0000 mg | Freq: Two times a day (BID) | INTRAMUSCULAR | Status: DC
  Administered 2019-10-29 – 2019-11-05 (×16): 40 mg via INTRAVENOUS
  Filled 2019-10-28 (×16): qty 1

## 2019-10-28 NOTE — Progress Notes (Signed)
Occupational Therapy Treatment Patient Details Name: Walter Baxter MRN: [id number] DOB: 26-Dec-1940 Today's Date: 10/28/2019    History of present illness Pt is 79 y/o M who presented to ER secondary to SOB, generalized weakness; admitted for management of acute respiratory failure related to COVID-19 PNA.   OT comments  Pt seen for OT tx this session to f/u re: EC techniques relative to ADL tolerance and performance. Pt on 5Lnc throughout session. Pt requires MINMOD verbal/tactile cues to utilize PLB techniques while seated EOB to participate in UB grooming and bathing tasks. Pt able to participate in 2-3 minute increments before requiring rest break. Pt requires setup for grooming and MIN A for bathing/applying deodorant mostly for line mgt and to assist d/t fatigue. He is able to perform sup to sit transition at start of session with SBA to CGA but requires MIN A for sit to sup transition d/t fatigue. Overall, as pt demos low fxl activity tolerance and is still requiring assist for aspects of self care and ADL mobility, anticipate SNF would be more appropriate d/c disposition for pt safety. Pt expresses some concerns about going home I'ly as his wife is disabled. States his niece and nephew check on him, but will not be available that often to help. Some STR would allow pt time to increased tolerance/endurance for ADLs/ADL mobility to improve safety for home environment.    Follow Up Recommendations  SNF    Equipment Recommendations  None recommended by OT    Recommendations for Other Services      Precautions / Restrictions Precautions Precautions: Fall Restrictions Weight Bearing Restrictions: No Other Position/Activity Restrictions: watch spO2 with activity, pt on 5Lnc this date.       Mobility Bed Mobility Overal bed mobility: Needs Assistance Bed Mobility: Supine to Sit;Sit to Supine     Supine to sit: Supervision Sit to supine: Min assist      Transfers                       Balance Overall balance assessment: Needs assistance Sitting-balance support: Feet supported;No upper extremity supported Sitting balance-Leahy Scale: Good                                     ADL either performed or assessed with clinical judgement   ADL       Grooming: Set up;Sitting Grooming Details (indicate cue type and reason): tolerating 2-3 mins seated activity and requiring 30 secs to 1 min reast break between participating in task Upper Body Bathing: Minimal assistance;Sitting Upper Body Bathing Details (indicate cue type and reason): tolerating participating in 2-3 minute incrememnts before requiring rest.         Lower Body Dressing: Moderate assistance;Sitting/lateral leans Lower Body Dressing Details (indicate cue type and reason): to thread socks, able to intiate task, but struggles and requires increased time and de-sats to 87%. requires PLB education and 2-3 mins to recover to >90%                     Vision Baseline Vision/History: Cataracts Patient Visual Report: No change from baseline     Perception     Praxis      Cognition Arousal/Alertness: Awake/alert Behavior During Therapy: WFL for tasks assessed/performed Overall Cognitive Status: Within Functional Limits for tasks assessed  Exercises Other Exercises Other Exercises: OT facilitates pt participation in PLB techniques three times during session (between activities) with MIN tactile/verbal cues to breathe through nose to recieve oxygen. Oxygen de-sats to 87% at the lowest during session and takes 3 mins to recover at the longest.   Shoulder Instructions       General Comments      Pertinent Vitals/ Pain       Pain Assessment: No/denies pain  Home Living                                          Prior Functioning/Environment              Frequency  Min 2X/week         Progress Toward Goals  OT Goals(current goals can now be found in the care plan section)  Progress towards OT goals: Progressing toward goals  Acute Rehab OT Goals Patient Stated Goal: to return home OT Goal Formulation: With patient Time For Goal Achievement: 11/08/19 Potential to Achieve Goals: Good  Plan Discharge plan needs to be updated;Frequency remains appropriate    Co-evaluation                 AM-PAC OT "6 Clicks" Daily Activity     Outcome Measure   Help from another person eating meals?: None Help from another person taking care of personal grooming?: A Little Help from another person toileting, which includes using toliet, bedpan, or urinal?: A Little Help from another person bathing (including washing, rinsing, drying)?: A Lot Help from another person to put on and taking off regular upper body clothing?: None Help from another person to put on and taking off regular lower body clothing?: A Lot 6 Click Score: 18    End of Session    OT Visit Diagnosis: Muscle weakness (generalized) (M62.81);Other abnormalities of gait and mobility (R26.89)   Activity Tolerance Patient tolerated treatment well   Patient Left in bed;with call bell/phone within reach;with bed alarm set   Nurse Communication          Time: 2947-6546 OT Time Calculation (min): 45 min  Charges: OT Treatments $Self Care/Home Management : 23-37 mins $Therapeutic Activity: 8-22 mins  Rejeana Brock, MS, OTR/L ascom [phone] 10/28/19, 1:51 PM

## 2019-10-28 NOTE — Progress Notes (Signed)
Following message sent to MD:  Pt on 6 L [place]. Is now on 6 LPM and is below 90 consistently. Will be placing patient on HF [place] as soon as we have sterile water for HF setup.

## 2019-10-28 NOTE — Progress Notes (Signed)
Physical Therapy Treatment Patient Details Name: Walter Baxter MRN: [id number] DOB: Jan 20, 1941 Today's Date: 10/28/2019    History of Present Illness Pt is 79 y/o M who presented to ER secondary to SOB, generalized weakness; admitted for management of acute respiratory failure related to COVID-19 PNA.    PT Comments    Pt was supine (HOB) in bed upon arriving. He agrees to PT session and is cooperative throughout. Pt on 6 L o2 throughout session. He was able to ambulate from bed to doorway x 2 with RW for safety. He desats but quickly recovers to 90% with seated rest. Pt very fatigued after one trial of ambulation and requested returning to supine in bed. Therapist discussed progress with RN. Pt with improved abilities this date and is progressing with PT. Will continue to follow per POC.     Follow Up Recommendations  Home health PT;Supervision/Assistance - 24 hour     Equipment Recommendations  Rolling walker with 5" wheels    Recommendations for Other Services       Precautions / Restrictions Precautions Precautions: Fall Restrictions Weight Bearing Restrictions: No    Mobility  Bed Mobility Overal bed mobility: Needs Assistance Bed Mobility: Supine to Sit;Sit to Supine Rolling: Supervision Sidelying to sit: Supervision;HOB elevated Supine to sit: Supervision Sit to supine: Supervision   General bed mobility comments: Pt demonstarted ability to exit/enter bed with supervision. No lifting assistance required  Transfers Overall transfer level: Needs assistance Equipment used: Rolling walker (2 wheeled) Transfers: Sit to/from Stand Sit to Stand: Min guard;From elevated surface         General transfer comment: Pt was able to stand from EOB with CGA for safety.   Ambulation/Gait Ambulation/Gait assistance: Min guard Gait Distance (Feet): 30 Feet Assistive device: Rolling walker (2 wheeled) Gait Pattern/deviations: WFL(Within Functional Limits)     General  Gait Details: Pt was able to ambulate from EOB to doorway x 2 prior to requesting seated rest 2/2 to fatigue. pt desats to 84% on 6 L but quickly recovers to 90s with seated rest. pt unwilling to trial gait training further and requested to returnt o supine   Stairs             Wheelchair Mobility    Modified Rankin (Stroke Patients Only)       Balance Overall balance assessment: Modified Independent Sitting-balance support: Feet supported;No upper extremity supported Sitting balance-Leahy Scale: Good                                      Cognition Arousal/Alertness: Awake/alert Behavior During Therapy: WFL for tasks assessed/performed Overall Cognitive Status: Within Functional Limits for tasks assessed                                 General Comments: Per RN pt has had some confusion this morning however pt was A and O during PT session      Exercises Other Exercises Other Exercises: OT facilitates pt participation in PLB techniques three times during session (between activities) with MIN tactile/verbal cues to breathe through nose to recieve oxygen. Oxygen de-sats to 87% at the lowest during session and takes 3 mins to recover at the longest.    General Comments        Pertinent Vitals/Pain Pain Assessment: No/denies pain Pain Score: 0-No pain  Home Living                      Prior Function            PT Goals (current goals can now be found in the care plan section) Acute Rehab PT Goals Patient Stated Goal: to return home Progress towards PT goals: Progressing toward goals    Frequency    Min 2X/week      PT Plan Current plan remains appropriate    Co-evaluation              AM-PAC PT "6 Clicks" Mobility   Outcome Measure  Help needed turning from your back to your side while in a flat bed without using bedrails?: None Help needed moving from lying on your back to sitting on the side of a flat  bed without using bedrails?: None Help needed moving to and from a bed to a chair (including a wheelchair)?: A Little Help needed standing up from a chair using your arms (e.g., wheelchair or bedside chair)?: A Little Help needed to walk in hospital room?: A Little Help needed climbing 3-5 steps with a railing? : A Little 6 Click Score: 20    End of Session Equipment Utilized During Treatment: Oxygen(4 L throughout sesison) Activity Tolerance: Patient limited by fatigue Patient left: in bed;with call bell/phone within reach;with bed alarm set Nurse Communication: Mobility status PT Visit Diagnosis: Difficulty in walking, not elsewhere classified (R26.2);Muscle weakness (generalized) (M62.81)     Time: 2952-8413 PT Time Calculation (min) (ACUTE ONLY): 18 min  Charges:  $Gait Training: 8-22 mins                     Julaine Fusi PTA 10/28/19, 4:37 PM

## 2019-10-28 NOTE — Progress Notes (Signed)
PROGRESS NOTE    Walter Baxter  IRS:[id number] DOB: 1940/12/24 DOA: 10/20/2019 PCP: Mayra Neer, MD   Brief Narrative:  Walter Baxter a 79 y.o.malewith medical history significant fordiet-controlled diabetes, hypertension, and hyperlipidemia who presents to the ED for evaluation of shortness of breath.Patient reports new sudden onset of significant generalized weakness, fatigue, and shortness of breath beginning the night before admission. He has had associated intermittent fevers, chills, loss of taste/smell, and rare nonproductive cough. Admitted for COVID-19 pneumonia, treated with remdesivir, Decadron and Actemra.  Continue to require O2 supplement Wife was also admitted for COVID-19 infection.  Subjective: Patient was feeling better when seen this morning.He became hypoxic with mild exertion requiring upto 6 L.Still feeling little short of breath.  Assessment & Plan:   Principal Problem:   Acute hypoxemic respiratory failure due to COVID-19 [facility]) Active Problems:   Essential hypertension   Hyperlipidemia   Type 2 diabetes mellitus ([place])  Acute hypoxic failure secondary to COVID-19 pneumonia.  Completed a course of remdesivir.  He was given Actemra X I. Continue to remain hypoxic with worsening oxygen requirement, now saturating on 6 L. Lower extremity Doppler was positive for thrombus in right posterior tibial and peroneal vein.  D-dimer remained stable above 4000.  Rest of the inflammatory markers improving. CTA was negative for PE. -Switch Decadron with Solu-Medrol 40 mg twice daily. -Consult pulmonary for worsening hypoxia-appreciate their recommendations. -He was started on Mucomyst. -Continue Xarelto for DVT. -Continue zinc and vitamin C -Continue incentive spirometry. -Might have to go home with home oxygen. -PT/OT recommending SNF placement but patient and wife decided to go back home with home health services.  Hypertension.  Blood pressure was  within goal. -Continue to monitor. -Continue Lopressor if heart rate within normal limit. -Can use hydralazine as needed for systolic above 009.  Type 2 diabetes mellitus.  Well-controlled with A1c of 6.1 And had some elevated blood sugar levels most likely secondary to steroid. -Continue to monitor and SSI.  Acute urinary retention.  Unclear etiology, most likely secondary to BPH. Has been resolved.  Patient is urinating without any difficulty. -Continue Flomax.  Objective: Vitals:   10/28/19 0832 10/28/19 0835 10/28/19 0836 10/28/19 1114  BP:      Pulse: (!) 55 63 64 60  Resp:      Temp:      TempSrc:      SpO2:  90% 91% 96%  Weight:      Height:        Intake/Output Summary (Last 24 hours) at 10/28/2019 1219 Last data filed at 10/28/2019 0710 Gross per 24 hour  Intake 0 ml  Output 250 ml  Net -250 ml   Filed Weights   10/20/19 1724 10/20/19 2331 10/22/19 0532  Weight: 105.2 kg 99.9 kg 98.7 kg    Examination:  General exam: Conically ill-appearing elderly man, appears calm and comfortable  Respiratory system: Clear to auscultation. Respiratory effort normal. Cardiovascular system: S1 & S2 heard, RRR. No JVD, murmurs, rubs, gallops or clicks. Gastrointestinal system: Soft, nontender, nondistended, bowel sounds positive. Central nervous system: Alert and oriented. No focal neurological deficits.Symmetric 5 x 5 power. Extremities: No edema, no cyanosis, pulses intact and symmetrical. Skin: No rashes, lesions or ulcers Psychiatry: Judgement and insight appear normal.    DVT prophylaxis: Xarelto Code Status: Full Family Communication: Wife was updated on phone. Disposition Plan: Pending improvement, most likely next couple of days.  Might go home with home oxygen if fail to wean off.  He will go back home with home health services.  Consultants:   Pulmonary  Procedures:  Antimicrobials:   Data Reviewed: I have personally reviewed following labs and imaging  studies  CBC: Recent Labs  Lab 10/22/19 0734 10/23/19 0644 10/24/19 0433 10/25/19 0731 10/28/19 0600  WBC 13.0* 13.8* 12.8* 16.2* 16.7*  NEUTROABS 11.7* 12.5* 10.6* 14.7*  --   HGB 14.1 14.1 13.5 14.3 15.0  HCT 41.7 41.2 39.2 42.6 44.3  MCV 84.9 86.0 85.2 86.6 88.4  PLT 302 318 310 271 242   Basic Metabolic Panel: Recent Labs  Lab 10/22/19 0734 10/22/19 0734 10/23/19 0644 10/24/19 0433 10/25/19 0731 10/26/19 0841 10/28/19 0600  NA 140   < > 140 141 139 138 138  K 3.6   < > 3.7 3.3* 4.7 4.4 4.8  CL 107   < > 109 108 108 107 107  CO2 22   < > 22 25 22  21* 22  GLUCOSE 202*   < > 204* 142* 248* 201* 221*  BUN 26*   < > 26* 25* 29* 29* 31*  CREATININE 0.92   < > 0.94 0.95 1.01 0.96 0.96  CALCIUM 7.7*   < > 7.8* 7.5* 7.6* 7.6* 7.8*  MG 2.1  --  2.2 2.1 2.0  --   --   PHOS 3.0  --  2.8 2.9 3.5  --   --    < > = values in this interval not displayed.   GFR: Estimated Creatinine Clearance: 73.4 mL/min (by C-G formula based on SCr of 0.96 mg/dL). Liver Function Tests: Recent Labs  Lab 10/22/19 0734 10/23/19 0644 10/24/19 0433 10/25/19 0731  AST 30 37 69* 42*  ALT 19 24 40 40  ALKPHOS 52 55 58 58  BILITOT 0.8 0.8 0.7 0.6  PROT 6.1* 6.0* 5.2* 5.4*  ALBUMIN 2.3* 2.3* 2.1* 2.3*   No results for input(s): LIPASE, AMYLASE in the last 168 hours. No results for input(s): AMMONIA in the last 168 hours. Coagulation Profile: No results for input(s): INR, PROTIME in the last 168 hours. Cardiac Enzymes: No results for input(s): CKTOTAL, CKMB, CKMBINDEX, TROPONINI in the last 168 hours. BNP (last 3 results) No results for input(s): PROBNP in the last 8760 hours. HbA1C: No results for input(s): HGBA1C in the last 72 hours. CBG: Recent Labs  Lab 10/27/19 0809 10/27/19 1218 10/27/19 1612 10/27/19 1948 10/28/19 1130  GLUCAP 222* 167* 125* 139* 207*   Lipid Profile: No results for input(s): CHOL, HDL, LDLCALC, TRIG, CHOLHDL, LDLDIRECT in the last 72 hours. Thyroid  Function Tests: No results for input(s): TSH, T4TOTAL, FREET4, T3FREE, THYROIDAB in the last 72 hours. Anemia Panel: No results for input(s): VITAMINB12, FOLATE, FERRITIN, TIBC, IRON, RETICCTPCT in the last 72 hours. Sepsis Labs: Recent Labs  Lab 10/26/19 0841  PROCALCITON <0.10    Recent Results (from the past 240 hour(s))  Respiratory Panel by RT PCR (Flu A&B, Covid) - Nasopharyngeal Swab     Status: Abnormal   Collection Time: 10/20/19  5:30 PM   Specimen: Nasopharyngeal Swab  Result Value Ref Range Status   SARS Coronavirus 2 by RT PCR POSITIVE (A) NEGATIVE Final    Comment: RESULT CALLED TO, READ BACK BY AND VERIFIED WITH: BRIANNA CHAPMON @1900  10/20/19 MJU (NOTE) SARS-CoV-2 target nucleic acids are DETECTED. SARS-CoV-2 RNA is generally detectable in upper respiratory specimens  during the acute phase of infection. Positive results are indicative of the presence of the identified virus, but do not rule out bacterial  infection or co-infection with other pathogens not detected by the test. Clinical correlation with patient history and other diagnostic information is necessary to determine patient infection status. The expected result is Negative. Fact Sheet for Patients:  https://www.moore.com/ Fact Sheet for Healthcare Providers: https://www.young.biz/ This test is not yet approved or cleared by the [place] FDA and  has been authorized for detection and/or diagnosis of SARS-CoV-2 by FDA under an Emergency Use Authorization (EUA).  This EUA will remain in effect (meaning this test can be used) for  the duration of  the COVID-19 declaration under Section 564(b)(1) of the Act, 21 U.S.C. section 360bbb-3(b)(1), unless the authorization is terminated or revoked sooner.    Influenza A by PCR NEGATIVE NEGATIVE Final   Influenza B by PCR NEGATIVE NEGATIVE Final    Comment: (NOTE) The Xpert Xpress SARS-CoV-2/FLU/RSV assay is intended as  an aid in  the diagnosis of influenza from Nasopharyngeal swab specimens and  should not be used as a sole basis for treatment. Nasal washings and  aspirates are unacceptable for Xpert Xpress SARS-CoV-2/FLU/RSV  testing. Fact Sheet for Patients: https://www.moore.com/ Fact Sheet for Healthcare Providers: https://www.young.biz/ This test is not yet approved or cleared by the [place] FDA and  has been authorized for detection and/or diagnosis of SARS-CoV-2 by  FDA under an Emergency Use Authorization (EUA). This EUA will remain  in effect (meaning this test can be used) for the duration of the  Covid-19 declaration under Section 564(b)(1) of the Act, 21  U.S.C. section 360bbb-3(b)(1), unless the authorization is  terminated or revoked. Performed at [facility], [address] [place], [place] [address]      Radiology Studies: CT ANGIO CHEST PE W OR WO CONTRAST  Result Date: 10/26/2019 CLINICAL DATA:  Shortness of breath, COVID positive EXAM: CT ANGIOGRAPHY CHEST WITH CONTRAST TECHNIQUE: Multidetector CT imaging of the chest was performed using the standard protocol during bolus administration of intravenous contrast. Multiplanar CT image reconstructions and MIPs were obtained to evaluate the vascular anatomy. CONTRAST:  29mL OMNIPAQUE IOHEXOL 350 MG/ML SOLN COMPARISON:  None. FINDINGS: Cardiovascular: Satisfactory opacification of the pulmonary arteries to the proximal segmental level. No evidence of pulmonary embolism. Normal heart size. No pericardial effusion. Coronary artery and thoracic aorta atherosclerotic calcification. Mediastinum/Nodes: No mediastinal, hilar, or axillary adenopathy. Thyroid is unremarkable. Esophagus appears normal. Lungs/Pleura: There is multifocal consolidation greatest at the periphery and lung bases. No pleural effusion or pneumothorax. Upper Abdomen: No acute abnormality. Musculoskeletal: No chest wall  abnormality. No acute or significant osseous findings. Review of the MIP images confirms the above findings. IMPRESSION: No evidence of acute pulmonary embolism. Bilateral consolidative opacities likely reflecting pneumonia (including COVID-19). Aortic Atherosclerosis (ICD10-I70.0). Electronically Signed   By: Guadlupe Spanish M.D.   On: 10/26/2019 17:03    Scheduled Meds:  vitamin C  500 mg Oral Daily   famotidine  20 mg Oral BID   insulin aspart  0-5 Units Subcutaneous QHS   insulin aspart  0-9 Units Subcutaneous TID WC   Ipratropium-Albuterol  1 puff Inhalation Q6H   methylPREDNISolone (SOLU-MEDROL) injection  60 mg Intravenous Daily   metoprolol tartrate  50 mg Oral BID   rivaroxaban  15 mg Oral BID AC   Followed by   Melene Muller ON 11/16/2019] rivaroxaban  20 mg Oral Q supper   sodium chloride flush  3 mL Intravenous Q12H   tamsulosin  0.4 mg Oral Daily   zinc sulfate  220 mg Oral Daily   Continuous Infusions:  sodium chloride Stopped (10/24/19 1900)     LOS: 8 days   Time spent: 45 minutes.  Arnetha Courser, MD [facility]  If 7PM-7AM, please contact night-coverage 10/28/2019, 12:19 PM   This record has been created using Dragon voice recognition software. Errors have been sought and corrected,but may not always be located. Such creation errors do not reflect on the standard of care.

## 2019-10-28 NOTE — Care Management Important Message (Signed)
Important Message  Patient Details  Name: DEMETRIAS GOODBAR MRN: [id number] Date of Birth: June 05, 1941   Medicare Important Message Given:  Yes     Trenton Founds, RN 10/28/2019, 10:47 AM

## 2019-10-28 NOTE — Progress Notes (Signed)
Touched base ith DrAmin regards to patients increased oxygen needs. Asked for consideration for transfer to [facility]. Per Dr Nelson Chimes transfer not necessary at this time. Will CTM patient who is currently on 6 LPM [place].

## 2019-10-28 NOTE — Consult Note (Signed)
-   Pulmonary Medicine          Date: 10/28/2019,   MRN# [id number] Walter Baxter [birth year]     AdmissionWeight: 105.2 kg                 CurrentWeight: 98.7 kg      CHIEF COMPLAINT:   Increased O2 requirement with COVID 19 pneumonitis   HISTORY OF PRESENT ILLNESS   Walter Baxter is a 79 y.o. male with medical history significant for diet-controlled diabetes, hypertension, and hyperlipidemia who presents to the ED for evaluation of shortness of breath.  Patient reports new sudden onset of significant generalized weakness, fatigue, and shortness of breath beginning the night before admission.  He has had associated intermittent fevers, chills, loss of taste/smell, and rare nonproductive cough.  He denies any nausea, vomiting, abdominal pain, diarrhea, or dysuria.  He says his wife is currently admitted for Covid 19 infection as well. Initial vitals showed BP 165/64, pulse 94, RR 32, temp 100.5 Fahrenheit, SPO2 95% on 6 L supplemental O2 via [place].  Per report, patient desaturated to 86% on room air. Labs are notable for WBC 9.8, hemoglobin 15.2, platelets 243,000, BUN 21, creatinine 1.33, sodium 135, potassium 3.7, bicarb 22, AST 46, ALT 24, procalcitonin 0.21, fibrin derivatives D-dimer 2932.23, lactic acid 3.7, LDH 363, ferritin 3396, triglycerides 249, fibrinogen >750, Portable chest x-ray showed airspace opacities in the peripheral right midlung and left lower lung. After receiving treatment with Remdesevir and decadron during first week of admission patient is now noted to have worsening hypoxemia and increased O2 requirement which has lead to pulmonary consultation for further evaluation and management. Patient is weak overall he is able to take only shallow breaths up to 175cc tidal volume on incentive spirometer.       PAST MEDICAL HISTORY   Past Medical History:  Diagnosis Date  . Allergic rhinitis   . Angio-edema   . Chronic bronchitis ([place])   . Erectile  dysfunction   . Essential hypertension   . Hyperlipidemia   . Obesity   . Type 2 diabetes mellitus ([place])   [id number]   SURGICAL HISTORY   Past Surgical History:  Procedure Laterality Date  . ADENOIDECTOMY    . TONSILLECTOMY       FAMILY HISTORY   Family History  Problem Relation Age of Onset  . Lupus Sister      SOCIAL HISTORY   Social History   Tobacco Use  . Smoking status: Never Smoker  . Smokeless tobacco: Never Used  Substance Use Topics  . Alcohol use: Not Currently  . Drug use: Never     MEDICATIONS    Home Medication:    Current Medication:  Current Facility-Administered Medications:  .  0.9 %  sodium chloride infusion, , Intravenous, PRN, Lavina Hamman, MD, Stopped at 10/24/19 1900 .  acetaminophen (TYLENOL) tablet 650 mg, 650 mg, Oral, Q6H PRN, Lenore Cordia, MD, 650 mg at 10/20/19 2058 .  ascorbic acid (VITAMIN C) tablet 500 mg, 500 mg, Oral, Daily, Zada Finders R, MD, 500 mg at 10/27/19 2040 .  chlorpheniramine-HYDROcodone (TUSSIONEX) 10-8 MG/5ML suspension 5 mL, 5 mL, Oral, Q12H PRN, Zada Finders R, MD .  famotidine (PEPCID) tablet 20 mg, 20 mg, Oral, BID, Zada Finders R, MD, 20 mg at 10/28/19 0829 .  guaiFENesin-dextromethorphan (ROBITUSSIN DM) 100-10 MG/5ML syrup 10 mL, 10 mL, Oral, Q4H PRN, Patel, Vishal R, MD .  insulin aspart (novoLOG) injection 0-5 Units, 0-5 Units,  Subcutaneous, QHS, Rolly Salter, MD, 3 Units at 10/22/19 2211 .  insulin aspart (novoLOG) injection 0-9 Units, 0-9 Units, Subcutaneous, TID WC, Rolly Salter, MD, 2 Units at 10/28/19 0830 .  Ipratropium-Albuterol (COMBIVENT) respimat 1 puff, 1 puff, Inhalation, Q6H, Charlsie Quest, MD, 1 puff at 10/28/19 0831 .  methylPREDNISolone sodium succinate (SOLU-MEDROL) 125 mg/2 mL injection 60 mg, 60 mg, Intravenous, Daily, [name], Sumayya, MD .  metoprolol tartrate (LOPRESSOR) tablet 50 mg, 50 mg, Oral, BID, Charlsie Quest, MD, Stopped at 10/27/19 2133 .  ondansetron  (ZOFRAN) tablet 4 mg, 4 mg, Oral, Q6H PRN **OR** ondansetron (ZOFRAN) injection 4 mg, 4 mg, Intravenous, Q6H PRN, Charlsie Quest, MD .  Rivaroxaban (XARELTO) tablet 15 mg, 15 mg, Oral, BID AC, 15 mg at 10/28/19 0830 **FOLLOWED BY** [START ON 11/16/2019] rivaroxaban (XARELTO) tablet 20 mg, 20 mg, Oral, Q supper, [name], Sumayya, MD .  sodium chloride flush (NS) 0.9 % injection 3 mL, 3 mL, Intravenous, Q12H, Darreld Mclean R, MD, 3 mL at 10/28/19 0831 .  tamsulosin (FLOMAX) capsule 0.4 mg, 0.4 mg, Oral, Daily, Rolly Salter, MD, 0.4 mg at 10/28/19 0830 .  zinc sulfate capsule 220 mg, 220 mg, Oral, Daily, Darreld Mclean R, MD, 220 mg at 10/27/19 2040    ALLERGIES   Other     REVIEW OF SYSTEMS    Review of Systems:  Gen:  Denies  fever, sweats, chills weigh loss  HEENT: Denies blurred vision, double vision, ear pain, eye pain, hearing loss, nose bleeds, sore throat Cardiac:  No dizziness, chest pain or heaviness, chest tightness,edema Resp:   Reports cough or sputum porduction, shortness of breath,wheezing, hemoptysis,  Gi: Denies swallowing difficulty, stomach pain, nausea or vomiting, diarrhea, constipation, bowel incontinence Gu:  Denies bladder incontinence, burning urine Ext:   Denies Joint pain, stiffness or swelling Skin: Denies  skin rash, easy bruising or bleeding or hives Endoc:  Denies polyuria, polydipsia , polyphagia or weight change Psych:   Denies depression, insomnia or hallucinations   Other:  All other systems negative   VS: BP (!) 130/57   Pulse 64   Temp 98.3 F (36.8 C) (Oral)   Resp 15   Ht 5\' 8"  (1.727 m)   Wt 98.7 kg   SpO2 91%   BMI 33.07 kg/m      PHYSICAL EXAM    GENERAL:NAD, no fevers, chills, no weakness no fatigue HEAD: Normocephalic, atraumatic.  EYES: Pupils equal, round, reactive to light. Extraocular muscles intact. No scleral icterus.  MOUTH: Moist mucosal membrane. Dentition intact. No abscess noted.  EAR, NOSE, THROAT: Clear without  exudates. No external lesions.  NECK: Supple. No thyromegaly. No nodules. No JVD.  PULMONARY: Diffuse coarse rhonchi bilaterally  No wheezes no crackles CARDIOVASCULAR: S1 and S2. Regular rate and rhythm. No murmurs, rubs, or gallops. No edema. Pedal pulses 2+ bilaterally.  GASTROINTESTINAL: Soft, nontender, nondistended. No masses. Positive bowel sounds. No hepatosplenomegaly.  MUSCULOSKELETAL: No swelling, clubbing, or edema. Range of motion full in all extremities.  NEUROLOGIC: Cranial nerves II through XII are intact. No gross focal neurological deficits. Sensation intact. Reflexes intact.  SKIN: No ulceration, lesions, rashes, or cyanosis. Skin warm and dry. Turgor intact.  PSYCHIATRIC: Mood, affect within normal limits. The patient is awake, alert and oriented x 3. Insight, judgment intact.       IMAGING    CT ANGIO CHEST PE W OR WO CONTRAST  Result Date: 10/26/2019 CLINICAL DATA:  Shortness of breath, COVID positive  EXAM: CT ANGIOGRAPHY CHEST WITH CONTRAST TECHNIQUE: Multidetector CT imaging of the chest was performed using the standard protocol during bolus administration of intravenous contrast. Multiplanar CT image reconstructions and MIPs were obtained to evaluate the vascular anatomy. CONTRAST:  78mL OMNIPAQUE IOHEXOL 350 MG/ML SOLN COMPARISON:  None. FINDINGS: Cardiovascular: Satisfactory opacification of the pulmonary arteries to the proximal segmental level. No evidence of pulmonary embolism. Normal heart size. No pericardial effusion. Coronary artery and thoracic aorta atherosclerotic calcification. Mediastinum/Nodes: No mediastinal, hilar, or axillary adenopathy. Thyroid is unremarkable. Esophagus appears normal. Lungs/Pleura: There is multifocal consolidation greatest at the periphery and lung bases. No pleural effusion or pneumothorax. Upper Abdomen: No acute abnormality. Musculoskeletal: No chest wall abnormality. No acute or significant osseous findings. Review of the MIP images  confirms the above findings. IMPRESSION: No evidence of acute pulmonary embolism. Bilateral consolidative opacities likely reflecting pneumonia (including COVID-19). Aortic Atherosclerosis (ICD10-I70.0). Electronically Signed   By: Guadlupe Spanish M.D.   On: 10/26/2019 17:03   US Venous Img Lower Bilateral (DVT)  Result Date: 10/26/2019 CLINICAL DATA:  Lower extremity edema for 1 day EXAM: BILATERAL LOWER EXTREMITY VENOUS DOPPLER ULTRASOUND TECHNIQUE: Gray-scale sonography with graded compression, as well as color Doppler and duplex ultrasound were performed to evaluate the lower extremity deep venous systems from the level of the common femoral vein and including the common femoral, femoral, profunda femoral, popliteal and calf veins including the posterior tibial, peroneal and gastrocnemius veins when visible. The superficial great saphenous vein was also interrogated. Spectral Doppler was utilized to evaluate flow at rest and with distal augmentation maneuvers in the common femoral, femoral and popliteal veins. COMPARISON:  None. FINDINGS: RIGHT LOWER EXTREMITY Common Femoral Vein: No evidence of thrombus. Normal compressibility, respiratory phasicity and response to augmentation. Saphenofemoral Junction: No evidence of thrombus. Normal compressibility and flow on color Doppler imaging. Profunda Femoral Vein: No evidence of thrombus. Normal compressibility and flow on color Doppler imaging. Femoral Vein: No evidence of thrombus. Normal compressibility, respiratory phasicity and response to augmentation. Popliteal Vein: No evidence of thrombus. Normal compressibility, respiratory phasicity and response to augmentation. Calf Veins: Calf posterior tibial and peroneal veins demonstrate intraluminal thrombus appearing occlusive in the posterior tibial vein and nonocclusive in the peroneal vein. Vessels are noncompressible. Very low thrombus burden. LEFT LOWER EXTREMITY Common Femoral Vein: No evidence of thrombus.  Normal compressibility, respiratory phasicity and response to augmentation. Saphenofemoral Junction: No evidence of thrombus. Normal compressibility and flow on color Doppler imaging. Profunda Femoral Vein: No evidence of thrombus. Normal compressibility and flow on color Doppler imaging. Femoral Vein: No evidence of thrombus. Normal compressibility, respiratory phasicity and response to augmentation. Popliteal Vein: No evidence of thrombus. Normal compressibility, respiratory phasicity and response to augmentation. Calf Veins: No evidence of thrombus. Normal compressibility and flow on color Doppler imaging. IMPRESSION: Positive exam for calf region right posterior tibial and peroneal vein DVT. Very low thrombus burden. No propagation into the popliteal or femoral veins. Negative for left lower extremity DVT. Electronically Signed   By: Judie Petit.  Shick M.D.   On: 10/26/2019 11:54   DG Chest Port 1 View  Result Date: 10/24/2019 CLINICAL DATA:  Shortness of breath. EXAM: PORTABLE CHEST 1 VIEW COMPARISON:  October 20, 2019. FINDINGS: Stable cardiomediastinal silhouette. Atherosclerosis of thoracic aorta is noted. No pneumothorax or pleural effusion is noted. Mildly increased bilateral patchy ill-defined airspace opacities are noted consistent with multifocal pneumonia. Bony thorax is unremarkable. IMPRESSION: Aortic atherosclerosis. Mildly increased bilateral patchy airspace opacities are noted consistent with multifocal  pneumonia. Electronically Signed   By: Lupita Raider M.D.   On: 10/24/2019 08:08   DG Chest Port 1 View  Result Date: 10/20/2019 CLINICAL DATA:  Shortness of breath, COVID exposure. EXAM: PORTABLE CHEST 1 VIEW COMPARISON:  Chest radiograph dated 02/10/2011. FINDINGS: The heart appears enlarged. Vascular calcifications are seen in the aortic arch. Moderate airspace opacities are seen in the peripheral right mid lung and in the left lower lung. There is no pleural effusion or pneumothorax. The  osseous structures are intact. IMPRESSION: Moderate airspace opacities in the peripheral right mid lung and left lower lung may reflect COVID-19 pneumonia. Electronically Signed   By: Romona Curls M.D.   On: 10/20/2019 18:11      ASSESSMENT/PLAN   Acute hypoxemic respiratory failure due to COVID 19 induced pneumonia - Currently increased from 3 to 5L/min [place] - patient is s/p Vecluri and Actemra -CT chest - reviewed by me as above with bilateral multifocal atypical pneumonia - Solumedrol - changing to 40 BID  - Adding colchicine 0.6mg  PO daily - Chest phyisotherapy - bed PT - please encourage patient to use IS and flutter valve - I worked closely with patient and he is very weak able to only take tidal volume but he is in no distress and smiling during chest PT.  I will stop narcotic syrup to allow patient to take more vigorous breaths - patient is Net negative 4.5 L  And is borderline hypotensive - will not hold diuresis - Will add Mucomyst to nebulizer therapy BID to help thin out phlegm -PT/OT already ordered -will continue to follow along with you  - repeat CXR in am ordered   DVT   - patient already on anticoagulation   - no signs of bleeding       Thank you for allowing me to participate in the care of this patient.     Patient/Family are satisfied with care plan and all questions have been answered.  This document was prepared using Dragon voice recognition software and may include unintentional dictation errors.     Vida Rigger, M.D.  Division of Pulmonary & Critical Care Medicine  [facility]

## 2019-10-28 NOTE — Progress Notes (Signed)
Pt seen by RT. Request sent to pulmonologist for stat CXR for increased O2 need.

## 2019-10-28 NOTE — Discharge Instructions (Signed)
Information on my medicine - XARELTO (rivaroxaban)  This medication education was reviewed with me or my healthcare representative as part of my discharge preparation.    WHY WAS XARELTO PRESCRIBED FOR YOU? Xarelto was prescribed to treat blood clots that may have been found in the veins of your legs (deep vein thrombosis) or in your lungs (pulmonary embolism) and to reduce the risk of them occurring again.  What do you need to know about Xarelto? The starting dose is one 15 mg tablet taken TWICE daily with food for the FIRST 21 DAYS then on 11/16/19 the dose is changed to one 20 mg tablet taken ONCE A DAY with your evening meal.  DO NOT stop taking Xarelto without talking to the health care provider who prescribed the medication.  Refill your prescription for 20 mg tablets before you run out.  After discharge, you should have regular check-up appointments with your healthcare provider that is prescribing your Xarelto.  In the future your dose may need to be changed if your kidney function changes by a significant amount.  What do you do if you miss a dose? If you are taking Xarelto TWICE DAILY and you miss a dose, take it as soon as you remember. You may take two 15 mg tablets (total 30 mg) at the same time then resume your regularly scheduled 15 mg twice daily the next day.  If you are taking Xarelto ONCE DAILY and you miss a dose, take it as soon as you remember on the same day then continue your regularly scheduled once daily regimen the next day. Do not take two doses of Xarelto at the same time.   Important Safety Information Xarelto is a blood thinner medicine that can cause bleeding. You should call your healthcare provider right away if you experience any of the following: ? Bleeding from an injury or your nose that does not stop. ? Unusual colored urine (red or dark brown) or unusual colored stools (red or black). ? Unusual bruising for unknown reasons. ? A serious fall or  if you hit your head (even if there is no bleeding).  Some medicines may interact with Xarelto and might increase your risk of bleeding while on Xarelto. To help avoid this, consult your healthcare provider or pharmacist prior to using any new prescription or non-prescription medications, including herbals, vitamins, non-steroidal anti-inflammatory drugs (NSAIDs) and supplements.  This website has more information on Xarelto: VisitDestination.com.br.

## 2019-10-29 LAB — GLUCOSE, CAPILLARY
Glucose-Capillary: 181 mg/dL — ABNORMAL HIGH (ref 70–99)
Glucose-Capillary: 215 mg/dL — ABNORMAL HIGH (ref 70–99)
Glucose-Capillary: 245 mg/dL — ABNORMAL HIGH (ref 70–99)
Glucose-Capillary: 162 mg/dL — ABNORMAL HIGH (ref 70–99)
Glucose-Capillary: 286 mg/dL — ABNORMAL HIGH (ref 70–99)

## 2019-10-29 MED ORDER — DM-GUAIFENESIN ER 30-600 MG PO TB12
1.0000 | ORAL_TABLET | Freq: Two times a day (BID) | ORAL | Status: DC
  Administered 2019-10-29 – 2019-11-04 (×10): 1 via ORAL
  Filled 2019-10-29 (×15): qty 1

## 2019-10-29 MED ORDER — SODIUM CHLORIDE 0.9 % IV BOLUS: 500.0000 mL | Freq: Once | INTRAVENOUS | Status: DC

## 2019-10-29 NOTE — Progress Notes (Signed)
-   Pulmonary Medicine          Date: 10/29/2019,   MRN# [id number] Walter Baxter [birth year]     AdmissionWeight: 105.2 kg                 CurrentWeight: 98.7 kg      CHIEF COMPLAINT:   Increased O2 requirement with COVID 19 pneumonitis   HISTORY OF PRESENT ILLNESS   Patient is clinically improved.  He is still requiring significant support with supplemental O2 at 7L/min during my evaluation this morning.  Despite this he is able to take more vigorous breaths, today he was able to inhale tidal volume 550 whereas yesterday he was at 257ml.Marland Kitchen  He is in no pain and smiling, in good spirits appreciative of care.     PAST MEDICAL HISTORY   Past Medical History:  Diagnosis Date  . Allergic rhinitis   . Angio-edema   . Chronic bronchitis ([place])   . Erectile dysfunction   . Essential hypertension   . Hyperlipidemia   . Obesity   . Type 2 diabetes mellitus ([place])   [id number]   SURGICAL HISTORY   Past Surgical History:  Procedure Laterality Date  . ADENOIDECTOMY    . TONSILLECTOMY       FAMILY HISTORY   Family History  Problem Relation Age of Onset  . Lupus Sister      SOCIAL HISTORY   Social History   Tobacco Use  . Smoking status: Never Smoker  . Smokeless tobacco: Never Used  Substance Use Topics  . Alcohol use: Not Currently  . Drug use: Never     MEDICATIONS    Home Medication:    Current Medication:  Current Facility-Administered Medications:  .  0.9 %  sodium chloride infusion, , Intravenous, PRN, Lavina Hamman, MD, Stopped at 10/24/19 1900 .  acetaminophen (TYLENOL) tablet 650 mg, 650 mg, Oral, Q6H PRN, Lenore Cordia, MD, 650 mg at 10/28/19 2009 .  acetylcysteine (MUCOMYST) 20 % nebulizer / oral solution 4 mL, 4 mL, Nebulization, BID, Briceida Rasberry, MD .  ascorbic acid (VITAMIN C) tablet 500 mg, 500 mg, Oral, Daily, Zada Finders R, MD, 500 mg at 10/28/19 2006 .  colchicine tablet 0.6 mg, 0.6 mg, Oral, Daily, Jedrick Hutcherson,  Kurtiss Wence, MD, 0.6 mg at 10/29/19 0832 .  famotidine (PEPCID) tablet 20 mg, 20 mg, Oral, BID, Zada Finders R, MD, 20 mg at 10/29/19 [phone] .  guaiFENesin-dextromethorphan (ROBITUSSIN DM) 100-10 MG/5ML syrup 10 mL, 10 mL, Oral, Q4H PRN, Zada Finders R, MD, 10 mL at 10/29/19 0831 .  insulin aspart (novoLOG) injection 0-5 Units, 0-5 Units, Subcutaneous, QHS, Lavina Hamman, MD, 3 Units at 10/28/19 2158 .  insulin aspart (novoLOG) injection 0-9 Units, 0-9 Units, Subcutaneous, TID WC, Lavina Hamman, MD, 3 Units at 10/29/19 1150 .  Ipratropium-Albuterol (COMBIVENT) respimat 1 puff, 1 puff, Inhalation, Q6H, Zada Finders R, MD, 1 puff at 10/29/19 1431 .  methylPREDNISolone sodium succinate (SOLU-MEDROL) 40 mg/mL injection 40 mg, 40 mg, Intravenous, Q12H, Lanney Gins, Jahbari Repinski, MD, 40 mg at 10/29/19 1151 .  ondansetron (ZOFRAN) tablet 4 mg, 4 mg, Oral, Q6H PRN **OR** ondansetron (ZOFRAN) injection 4 mg, 4 mg, Intravenous, Q6H PRN, Lenore Cordia, MD .  Rivaroxaban (XARELTO) tablet 15 mg, 15 mg, Oral, BID AC, 15 mg at 10/29/19 0833 **FOLLOWED BY** [START ON 11/16/2019] rivaroxaban (XARELTO) tablet 20 mg, 20 mg, Oral, Q supper, [name], Sumayya, MD .  sodium chloride 0.9 % bolus 500 mL, 500  mL, Intravenous, Once, [name], Tilman Neat, MD .  sodium chloride flush (NS) 0.9 % injection 3 mL, 3 mL, Intravenous, Q12H, Darreld Mclean R, MD, 3 mL at 10/29/19 0832 .  tamsulosin (FLOMAX) capsule 0.4 mg, 0.4 mg, Oral, Daily, Rolly Salter, MD, 0.4 mg at 10/29/19 8469 .  zinc sulfate capsule 220 mg, 220 mg, Oral, Daily, Darreld Mclean R, MD, 220 mg at 10/28/19 2006    ALLERGIES   Other     REVIEW OF SYSTEMS    Review of Systems:  Gen:  Denies  fever, sweats, chills weigh loss  HEENT: Denies blurred vision, double vision, ear pain, eye pain, hearing loss, nose bleeds, sore throat Cardiac:  No dizziness, chest pain or heaviness, chest tightness,edema Resp:   Reports minimal cough,sputum porduction, but significant shortness  of breath, however without wheezing, and no hemoptysis,  Gi: Denies swallowing difficulty, stomach pain, nausea or vomiting, diarrhea, constipation, bowel incontinence Gu:  Denies bladder incontinence, burning urine Ext:   Denies Joint pain, stiffness or swelling Skin: Denies  skin rash, easy bruising or bleeding or hives Endoc:  Denies polyuria, polydipsia , polyphagia or weight change Psych:   Denies depression, insomnia or hallucinations   Other:  All other systems negative   VS: BP (!) 113/56   Pulse 60   Temp (!) 97.5 F (36.4 C)   Resp (!) 21   Ht 5\' 8"  (1.727 m)   Wt 98.7 kg   SpO2 96%   BMI 33.07 kg/m      PHYSICAL EXAM    GENERAL:NAD, no fevers, chills, no weakness no fatigue HEAD: Normocephalic, atraumatic.  EYES: Pupils equal, round, reactive to light. Extraocular muscles intact. No scleral icterus.  MOUTH: Moist mucosal membrane. Dentition intact. No abscess noted.  EAR, NOSE, THROAT: Clear without exudates. No external lesions.  NECK: Supple. No thyromegaly. No nodules. No JVD.  PULMONARY: Diffuse coarse rhonchi bilaterally  No wheezes no crackles CARDIOVASCULAR: S1 and S2. Regular rate and rhythm. No murmurs, rubs, or gallops. No edema. Pedal pulses 2+ bilaterally.  GASTROINTESTINAL: Soft, nontender, nondistended. No masses. Positive bowel sounds. No hepatosplenomegaly.  MUSCULOSKELETAL: No swelling, clubbing, or edema. Range of motion full in all extremities.  NEUROLOGIC: Cranial nerves II through XII are intact. No gross focal neurological deficits. Sensation intact. Reflexes intact.  SKIN: No ulceration, lesions, rashes, or cyanosis. Skin warm and dry. Turgor intact.  PSYCHIATRIC: Mood, affect within normal limits. The patient is awake, alert and oriented x 3. Insight, judgment intact.       IMAGING    CT ANGIO CHEST PE W OR WO CONTRAST  Result Date: 10/26/2019 CLINICAL DATA:  Shortness of breath, COVID positive EXAM: CT ANGIOGRAPHY CHEST WITH CONTRAST  TECHNIQUE: Multidetector CT imaging of the chest was performed using the standard protocol during bolus administration of intravenous contrast. Multiplanar CT image reconstructions and MIPs were obtained to evaluate the vascular anatomy. CONTRAST:  44mL OMNIPAQUE IOHEXOL 350 MG/ML SOLN COMPARISON:  None. FINDINGS: Cardiovascular: Satisfactory opacification of the pulmonary arteries to the proximal segmental level. No evidence of pulmonary embolism. Normal heart size. No pericardial effusion. Coronary artery and thoracic aorta atherosclerotic calcification. Mediastinum/Nodes: No mediastinal, hilar, or axillary adenopathy. Thyroid is unremarkable. Esophagus appears normal. Lungs/Pleura: There is multifocal consolidation greatest at the periphery and lung bases. No pleural effusion or pneumothorax. Upper Abdomen: No acute abnormality. Musculoskeletal: No chest wall abnormality. No acute or significant osseous findings. Review of the MIP images confirms the above findings. IMPRESSION: No evidence of acute pulmonary  embolism. Bilateral consolidative opacities likely reflecting pneumonia (including COVID-19). Aortic Atherosclerosis (ICD10-I70.0). Electronically Signed   By: Guadlupe Spanish M.D.   On: 10/26/2019 17:03   US Venous Img Lower Bilateral (DVT)  Result Date: 10/26/2019 CLINICAL DATA:  Lower extremity edema for 1 day EXAM: BILATERAL LOWER EXTREMITY VENOUS DOPPLER ULTRASOUND TECHNIQUE: Gray-scale sonography with graded compression, as well as color Doppler and duplex ultrasound were performed to evaluate the lower extremity deep venous systems from the level of the common femoral vein and including the common femoral, femoral, profunda femoral, popliteal and calf veins including the posterior tibial, peroneal and gastrocnemius veins when visible. The superficial great saphenous vein was also interrogated. Spectral Doppler was utilized to evaluate flow at rest and with distal augmentation maneuvers in the common  femoral, femoral and popliteal veins. COMPARISON:  None. FINDINGS: RIGHT LOWER EXTREMITY Common Femoral Vein: No evidence of thrombus. Normal compressibility, respiratory phasicity and response to augmentation. Saphenofemoral Junction: No evidence of thrombus. Normal compressibility and flow on color Doppler imaging. Profunda Femoral Vein: No evidence of thrombus. Normal compressibility and flow on color Doppler imaging. Femoral Vein: No evidence of thrombus. Normal compressibility, respiratory phasicity and response to augmentation. Popliteal Vein: No evidence of thrombus. Normal compressibility, respiratory phasicity and response to augmentation. Calf Veins: Calf posterior tibial and peroneal veins demonstrate intraluminal thrombus appearing occlusive in the posterior tibial vein and nonocclusive in the peroneal vein. Vessels are noncompressible. Very low thrombus burden. LEFT LOWER EXTREMITY Common Femoral Vein: No evidence of thrombus. Normal compressibility, respiratory phasicity and response to augmentation. Saphenofemoral Junction: No evidence of thrombus. Normal compressibility and flow on color Doppler imaging. Profunda Femoral Vein: No evidence of thrombus. Normal compressibility and flow on color Doppler imaging. Femoral Vein: No evidence of thrombus. Normal compressibility, respiratory phasicity and response to augmentation. Popliteal Vein: No evidence of thrombus. Normal compressibility, respiratory phasicity and response to augmentation. Calf Veins: No evidence of thrombus. Normal compressibility and flow on color Doppler imaging. IMPRESSION: Positive exam for calf region right posterior tibial and peroneal vein DVT. Very low thrombus burden. No propagation into the popliteal or femoral veins. Negative for left lower extremity DVT. Electronically Signed   By: Judie Petit.  Shick M.D.   On: 10/26/2019 11:54   DG Chest Port 1 View  Result Date: 10/28/2019 CLINICAL DATA:  79 year old male with pneumonia. EXAM:  PORTABLE CHEST 1 VIEW COMPARISON:  Chest radiograph dated 10/24/2019 and CT dated 10/26/2019. FINDINGS: Bilateral mid to lower lung field subpleural and streaky densities appear similar or slightly improved since the prior radiograph. No large pleural effusion or pneumothorax. Stable cardiomediastinal silhouette. Atherosclerotic calcification of the aorta. No acute osseous pathology. IMPRESSION: Similar or slightly improved pulmonary opacities. Continued follow-up recommended. Electronically Signed   By: Elgie Collard M.D.   On: 10/28/2019 20:37   DG Chest Port 1 View  Result Date: 10/24/2019 CLINICAL DATA:  Shortness of breath. EXAM: PORTABLE CHEST 1 VIEW COMPARISON:  October 20, 2019. FINDINGS: Stable cardiomediastinal silhouette. Atherosclerosis of thoracic aorta is noted. No pneumothorax or pleural effusion is noted. Mildly increased bilateral patchy ill-defined airspace opacities are noted consistent with multifocal pneumonia. Bony thorax is unremarkable. IMPRESSION: Aortic atherosclerosis. Mildly increased bilateral patchy airspace opacities are noted consistent with multifocal pneumonia. Electronically Signed   By: Lupita Raider M.D.   On: 10/24/2019 08:08   DG Chest Port 1 View  Result Date: 10/20/2019 CLINICAL DATA:  Shortness of breath, COVID exposure. EXAM: PORTABLE CHEST 1 VIEW COMPARISON:  Chest radiograph dated 02/10/2011.  FINDINGS: The heart appears enlarged. Vascular calcifications are seen in the aortic arch. Moderate airspace opacities are seen in the peripheral right mid lung and in the left lower lung. There is no pleural effusion or pneumothorax. The osseous structures are intact. IMPRESSION: Moderate airspace opacities in the peripheral right mid lung and left lower lung may reflect COVID-19 pneumonia. Electronically Signed   By: Romona Curls M.D.   On: 10/20/2019 18:11        ASSESSMENT/PLAN   Acute hypoxemic respiratory failure due to COVID 19 induced pneumonia -  Currently increased from 5 to 7L/min [place] - patient is s/p Vecluri and Actemra -CT chest - reviewed by me as above with bilateral multifocal atypical pneumonia - Solumedrol - changing to 40 BID  - Adding colchicine 0.6mg  PO daily - Chest phyisotherapy - bed PT - please encourage patient to use IS and flutter valve - I worked closely with patient and he is now able to inhale Vt at  - patient is Net negative 4.5 L  And is borderline hypotensive - will  diuresis - Unable to add any nebulized medication due to COVID precautions -PT/OT already ordered -will continue to follow along with you  - repeat CXR in am ordered   DVT   - patient already on anticoagulation   - no signs of bleeding       Thank you for allowing me to participate in the care of this patient.     Patient/Family are satisfied with care plan and all questions have been answered.  This document was prepared using Dragon voice recognition software and may include unintentional dictation errors.     Vida Rigger, M.D.  Division of Pulmonary & Critical Care Medicine  [facility]

## 2019-10-29 NOTE — Progress Notes (Signed)
Pt resting comfortably at this time was on HFNC 10 liters, o2 sats 98%. o2 decreased to 7 liters on HFNC, o2 sats 97%, no s/s of any distress noted. Will continue to monitor and support the patient.

## 2019-10-29 NOTE — Progress Notes (Signed)
PROGRESS NOTE    Walter Baxter  ION:[id number] DOB: 23-Jul-1941 DOA: 10/20/2019 PCP: Lupita Raider, MD   Brief Narrative:  Walter Baxter a 79 y.o.malewith medical history significant fordiet-controlled diabetes, hypertension, and hyperlipidemia who presents to the ED for evaluation of shortness of breath.Patient reports new sudden onset of significant generalized weakness, fatigue, and shortness of breath beginning the night before admission. He has had associated intermittent fevers, chills, loss of taste/smell, and rare nonproductive cough. Admitted for COVID-19 pneumonia, treated with remdesivir, Decadron and Actemra.  Continue to require O2 supplement Wife was also admitted for COVID-19 infection.  Subjective: Patient was feeling tired when seen this morning.He became hypoxic with mild exertion requiring upto 10 L with HFNC, currently at 7 L.  Assessment & Plan:   Principal Problem:   Acute hypoxemic respiratory failure due to COVID-19 [facility]) Active Problems:   Essential hypertension   Hyperlipidemia   Type 2 diabetes mellitus (H[facility])  Acute hypoxic failure secondary to COVID-19 pneumonia.  Completed a course of remdesivir.  He was given Actemra X I. Continue to remain hypoxic with worsening oxygen requirement, now saturating on 7 L with HFNC. Lower extremity Doppler was positive for thrombus in right posterior tibial and peroneal vein.  D-dimer remained stable above 4000.  Rest of the inflammatory markers improving. CTA was negative for PE. -Switch Decadron with Solu-Medrol 40 mg twice daily. -Consult pulmonary for worsening hypoxia-appreciate their recommendations. -He was started on Mucomyst. -Continue Xarelto for DVT. -Continue zinc and vitamin C -Continue incentive spirometry. -Might have to go home with home oxygen. -PT/OT recommending SNF placement but patient and wife decided to go back home with home health services.  Hypertension.  Blood pressure was  within goal. -Continue to monitor. -Continue Lopressor if heart rate within normal limit. -Can use hydralazine as needed for systolic above 160.  Type 2 diabetes mellitus.  Well-controlled with A1c of 6.1 And had some elevated blood sugar levels most likely secondary to steroid. -Continue to monitor and SSI.  Acute urinary retention.  Unclear etiology, most likely secondary to BPH. Has been resolved.  Patient is urinating without any difficulty. -Continue Flomax.  Objective: Vitals:   10/29/19 0600 10/29/19 0730 10/29/19 0838 10/29/19 0855  BP: 119/63 129/75    Pulse: 60 62 72   Resp: 18 (!) 21 15   Temp:  (!) 97.5 F (36.4 C)    TempSrc:      SpO2: 95% 94% 93% 95%  Weight:      Height:        Intake/Output Summary (Last 24 hours) at 10/29/2019 1110 Last data filed at 10/29/2019 2440 Gross per 24 hour  Intake 360 ml  Output 625 ml  Net -265 ml   Filed Weights   10/20/19 1724 10/20/19 2331 10/22/19 0532  Weight: 105.2 kg 99.9 kg 98.7 kg    Examination:  General exam: Conically ill-appearing elderly man, appears calm and comfortable  Respiratory system: Few scattered dry crackles. Respiratory effort normal. Cardiovascular system: S1 & S2 heard, RRR. No JVD, murmurs, rubs, gallops or clicks. Gastrointestinal system: Soft, nontender, nondistended, bowel sounds positive. Central nervous system: Alert and oriented. No focal neurological deficits.Symmetric 5 x 5 power. Extremities: No edema, no cyanosis, pulses intact and symmetrical. Skin: No rashes, lesions or ulcers Psychiatry: Judgement and insight appear normal.    DVT prophylaxis: Xarelto Code Status: Full Family Communication: Wife was updated on phone. Disposition Plan: Pending improvement, most likely next couple of days.  Might go home with  home oxygen if fail to wean off.  He will go back home with home health services.  Consultants:   Pulmonary  Procedures:  Antimicrobials:   Data Reviewed: I have  personally reviewed following labs and imaging studies  CBC: Recent Labs  Lab 10/23/19 0644 10/24/19 0433 10/25/19 0731 10/28/19 0600  WBC 13.8* 12.8* 16.2* 16.7*  NEUTROABS 12.5* 10.6* 14.7*  --   HGB 14.1 13.5 14.3 15.0  HCT 41.2 39.2 42.6 44.3  MCV 86.0 85.2 86.6 88.4  PLT 318 310 271 242   Basic Metabolic Panel: Recent Labs  Lab 10/23/19 0644 10/24/19 0433 10/25/19 0731 10/26/19 0841 10/28/19 0600  NA 140 141 139 138 138  K 3.7 3.3* 4.7 4.4 4.8  CL 109 108 108 107 107  CO2 22 25 22  21* 22  GLUCOSE 204* 142* 248* 201* 221*  BUN 26* 25* 29* 29* 31*  CREATININE 0.94 0.95 1.01 0.96 0.96  CALCIUM 7.8* 7.5* 7.6* 7.6* 7.8*  MG 2.2 2.1 2.0  --   --   PHOS 2.8 2.9 3.5  --   --    GFR: Estimated Creatinine Clearance: 73.4 mL/min (by C-G formula based on SCr of 0.96 mg/dL). Liver Function Tests: Recent Labs  Lab 10/23/19 0644 10/24/19 0433 10/25/19 0731  AST 37 69* 42*  ALT 24 40 40  ALKPHOS 55 58 58  BILITOT 0.8 0.7 0.6  PROT 6.0* 5.2* 5.4*  ALBUMIN 2.3* 2.1* 2.3*   No results for input(s): LIPASE, AMYLASE in the last 168 hours. No results for input(s): AMMONIA in the last 168 hours. Coagulation Profile: No results for input(s): INR, PROTIME in the last 168 hours. Cardiac Enzymes: No results for input(s): CKTOTAL, CKMB, CKMBINDEX, TROPONINI in the last 168 hours. BNP (last 3 results) No results for input(s): PROBNP in the last 8760 hours. HbA1C: No results for input(s): HGBA1C in the last 72 hours. CBG: Recent Labs  Lab 10/27/19 1948 10/28/19 1130 10/28/19 1609 10/28/19 2042 10/29/19 0733  GLUCAP 139* 207* 167* 276* 215*   Lipid Profile: No results for input(s): CHOL, HDL, LDLCALC, TRIG, CHOLHDL, LDLDIRECT in the last 72 hours. Thyroid Function Tests: No results for input(s): TSH, T4TOTAL, FREET4, T3FREE, THYROIDAB in the last 72 hours. Anemia Panel: No results for input(s): VITAMINB12, FOLATE, FERRITIN, TIBC, IRON, RETICCTPCT in the last 72  hours. Sepsis Labs: Recent Labs  Lab 10/26/19 0841  PROCALCITON <0.10    Recent Results (from the past 240 hour(s))  Respiratory Panel by RT PCR (Flu A&B, Covid) - Nasopharyngeal Swab     Status: Abnormal   Collection Time: 10/20/19  5:30 PM   Specimen: Nasopharyngeal Swab  Result Value Ref Range Status   SARS Coronavirus 2 by RT PCR POSITIVE (A) NEGATIVE Final    Comment: RESULT CALLED TO, READ BACK BY AND VERIFIED WITH: BRIANNA CHAPMON @1900  10/20/19 MJU (NOTE) SARS-CoV-2 target nucleic acids are DETECTED. SARS-CoV-2 RNA is generally detectable in upper respiratory specimens  during the acute phase of infection. Positive results are indicative of the presence of the identified virus, but do not rule out bacterial infection or co-infection with other pathogens not detected by the test. Clinical correlation with patient history and other diagnostic information is necessary to determine patient infection status. The expected result is Negative. Fact Sheet for Patients:  PinkCheek.be Fact Sheet for Healthcare Providers: GravelBags.it This test is not yet approved or cleared by the [place] FDA and  has been authorized for detection and/or diagnosis of SARS-CoV-2 by FDA under  an Emergency Use Authorization (EUA).  This EUA will remain in effect (meaning this test can be used) for  the duration of  the COVID-19 declaration under Section 564(b)(1) of the Act, 21 U.S.C. section 360bbb-3(b)(1), unless the authorization is terminated or revoked sooner.    Influenza A by PCR NEGATIVE NEGATIVE Final   Influenza B by PCR NEGATIVE NEGATIVE Final    Comment: (NOTE) The Xpert Xpress SARS-CoV-2/FLU/RSV assay is intended as an aid in  the diagnosis of influenza from Nasopharyngeal swab specimens and  should not be used as a sole basis for treatment. Nasal washings and  aspirates are unacceptable for Xpert Xpress  SARS-CoV-2/FLU/RSV  testing. Fact Sheet for Patients: https://www.moore.com/ Fact Sheet for Healthcare Providers: https://www.young.biz/ This test is not yet approved or cleared by the [place] FDA and  has been authorized for detection and/or diagnosis of SARS-CoV-2 by  FDA under an Emergency Use Authorization (EUA). This EUA will remain  in effect (meaning this test can be used) for the duration of the  Covid-19 declaration under Section 564(b)(1) of the Act, 21  U.S.C. section 360bbb-3(b)(1), unless the authorization is  terminated or revoked. Performed at [facility], [address], [place], [place] [address]      Radiology Studies: [facility] Chest [place] 1 View  Result Date: 10/28/2019 CLINICAL DATA:  79 year old male with pneumonia. EXAM: PORTABLE CHEST 1 VIEW COMPARISON:  Chest radiograph dated 10/24/2019 and CT dated 10/26/2019. FINDINGS: Bilateral mid to lower lung field subpleural and streaky densities appear similar or slightly improved since the prior radiograph. No large pleural effusion or pneumothorax. Stable cardiomediastinal silhouette. Atherosclerotic calcification of the aorta. No acute osseous pathology. IMPRESSION: Similar or slightly improved pulmonary opacities. Continued follow-up recommended. Electronically Signed   By: Elgie Collard M.D.   On: 10/28/2019 20:37    Scheduled Meds: . acetylcysteine  4 mL Nebulization BID  . vitamin C  500 mg Oral Daily  . colchicine  0.6 mg Oral Daily  . famotidine  20 mg Oral BID  . insulin aspart  0-5 Units Subcutaneous QHS  . insulin aspart  0-9 Units Subcutaneous TID WC  . Ipratropium-Albuterol  1 puff Inhalation Q6H  . methylPREDNISolone (SOLU-MEDROL) injection  40 mg Intravenous Q12H  . metoprolol tartrate  50 mg Oral BID  . rivaroxaban  15 mg Oral BID AC   Followed by  . [START ON 11/16/2019] rivaroxaban  20 mg Oral Q supper  . sodium chloride flush  3 mL Intravenous  Q12H  . tamsulosin  0.4 mg Oral Daily  . zinc sulfate  220 mg Oral Daily   Continuous Infusions: . sodium chloride Stopped (10/24/19 1900)     LOS: 9 days   Time spent: 45 minutes.  Arnetha Courser, MD [facility]  If 7PM-7AM, please contact night-coverage 10/29/2019, 11:10 AM   This record has been created using Dragon voice recognition software. Errors have been sought and corrected,but may not always be located. Such creation errors do not reflect on the standard of care.

## 2019-10-30 ENCOUNTER — Inpatient Hospital Stay: Payer: Medicare Other

## 2019-10-30 LAB — CBC
HCT: 43.9 % (ref 39.0–52.0)
Hemoglobin: 14.4 g/dL (ref 13.0–17.0)
MCH: 29.5 pg (ref 26.0–34.0)
MCHC: 32.8 g/dL (ref 30.0–36.0)
MCV: 90 fL (ref 80.0–100.0)
Platelets: 200 10*3/uL (ref 150–400)
RBC: 4.88 MIL/uL (ref 4.22–5.81)
RDW: 14.2 % (ref 11.5–15.5)
WBC: 21.2 10*3/uL — ABNORMAL HIGH (ref 4.0–10.5)
nRBC: 0 % (ref 0.0–0.2)

## 2019-10-30 LAB — BASIC METABOLIC PANEL
Anion gap: 9 (ref 5–15)
BUN: 36 mg/dL — ABNORMAL HIGH (ref 8–23)
CO2: 22 mmol/L (ref 22–32)
Calcium: 7.8 mg/dL — ABNORMAL LOW (ref 8.9–10.3)
Chloride: 107 mmol/L (ref 98–111)
Creatinine, Ser: 1.02 mg/dL (ref 0.61–1.24)
GFR calc Af Amer: >60 mL/min (ref >60)
GFR calc non Af Amer: >60 mL/min (ref >60)
Glucose, Bld: 240 mg/dL — ABNORMAL HIGH (ref 70–99)
Potassium: 4.9 mmol/L (ref 3.5–5.1)
Sodium: 138 mmol/L (ref 135–145)

## 2019-10-30 LAB — URINALYSIS, COMPLETE (UACMP) WITH MICROSCOPIC
Bilirubin Urine: NEGATIVE
Glucose, UA: 50 mg/dL — AB
Hgb urine dipstick: NEGATIVE
Ketones, ur: NEGATIVE mg/dL
Nitrite: NEGATIVE
Protein, ur: NEGATIVE mg/dL
Specific Gravity, Urine: 1.026 (ref 1.005–1.030)
Squamous Epithelial / HPF: NONE SEEN (ref 0–5)
pH: 5 (ref 5.0–8.0)

## 2019-10-30 LAB — GLUCOSE, CAPILLARY
Glucose-Capillary: 207 mg/dL — ABNORMAL HIGH (ref 70–99)
Glucose-Capillary: 221 mg/dL — ABNORMAL HIGH (ref 70–99)
Glucose-Capillary: 269 mg/dL — ABNORMAL HIGH (ref 70–99)
Glucose-Capillary: 298 mg/dL — ABNORMAL HIGH (ref 70–99)

## 2019-10-30 LAB — MRSA PCR SCREENING: MRSA by PCR: NEGATIVE

## 2019-10-30 LAB — PROCALCITONIN: Procalcitonin: <0.1 ng/mL

## 2019-10-30 NOTE — Progress Notes (Signed)
-   Pulmonary Medicine          Date: 10/30/2019,   MRN# [id number] SAEED TOREN 13-Apr-1941     AdmissionWeight: 105.2 kg                 CurrentWeight: 98.7 kg      CHIEF COMPLAINT:   Increased O2 requirement with COVID 19 pneumonitis   HISTORY OF PRESENT ILLNESS   Patient is clinically improved.  He is still requiring significant support with supplemental O2 at 7L/min during my evaluation this morning.    Had loose BM yesterday, eating well, no edema, using IS well without encouragment.     PAST MEDICAL HISTORY   Past Medical History:  Diagnosis Date  . Allergic rhinitis   . Angio-edema   . Chronic bronchitis (HCC)   . Erectile dysfunction   . Essential hypertension   . Hyperlipidemia   . Obesity   . Type 2 diabetes mellitus (HCC)   [id number]   SURGICAL HISTORY   Past Surgical History:  Procedure Laterality Date  . ADENOIDECTOMY    . TONSILLECTOMY       FAMILY HISTORY   Family History  Problem Relation Age of Onset  . Lupus Sister      SOCIAL HISTORY   Social History   Tobacco Use  . Smoking status: Never Smoker  . Smokeless tobacco: Never Used  Substance Use Topics  . Alcohol use: Not Currently  . Drug use: Never     MEDICATIONS    Home Medication:    Current Medication:  Current Facility-Administered Medications:  .  0.9 %  sodium chloride infusion, , Intravenous, PRN, Rolly Salter, MD, Stopped at 10/24/19 1900 .  acetaminophen (TYLENOL) tablet 650 mg, 650 mg, Oral, Q6H PRN, Charlsie Quest, MD, 650 mg at 10/28/19 2009 .  ascorbic acid (VITAMIN C) tablet 500 mg, 500 mg, Oral, Daily, Darreld Mclean R, MD, 500 mg at 10/29/19 2154 .  colchicine tablet 0.6 mg, 0.6 mg, Oral, Daily, Wesleigh Markovic, MD, 0.6 mg at 10/30/19 0831 .  dextromethorphan-guaiFENesin (MUCINEX DM) 30-600 MG per 12 hr tablet 1 tablet, 1 tablet, Oral, BID, Vida Rigger, MD, 1 tablet at 10/30/19 [phone] .  famotidine (PEPCID) tablet 20 mg, 20 mg,  Oral, BID, Darreld Mclean R, MD, 20 mg at 10/30/19 0831 .  guaiFENesin-dextromethorphan (ROBITUSSIN DM) 100-10 MG/5ML syrup 10 mL, 10 mL, Oral, Q4H PRN, Darreld Mclean R, MD, 10 mL at 10/29/19 0831 .  insulin aspart (novoLOG) injection 0-5 Units, 0-5 Units, Subcutaneous, QHS, Rolly Salter, MD, 3 Units at 10/29/19 2154 .  insulin aspart (novoLOG) injection 0-9 Units, 0-9 Units, Subcutaneous, TID WC, Rolly Salter, MD, 3 Units at 10/30/19 1153 .  Ipratropium-Albuterol (COMBIVENT) respimat 1 puff, 1 puff, Inhalation, Q6H, Darreld Mclean R, MD, 1 puff at 10/30/19 1310 .  methylPREDNISolone sodium succinate (SOLU-MEDROL) 40 mg/mL injection 40 mg, 40 mg, Intravenous, Q12H, Karna Christmas, Selen Smucker, MD, 40 mg at 10/30/19 1153 .  ondansetron (ZOFRAN) tablet 4 mg, 4 mg, Oral, Q6H PRN **OR** ondansetron (ZOFRAN) injection 4 mg, 4 mg, Intravenous, Q6H PRN, Charlsie Quest, MD .  Rivaroxaban (XARELTO) tablet 15 mg, 15 mg, Oral, BID AC, 15 mg at 10/30/19 0833 **FOLLOWED BY** [START ON 11/16/2019] rivaroxaban (XARELTO) tablet 20 mg, 20 mg, Oral, Q supper, [name], Sumayya, MD .  sodium chloride 0.9 % bolus 500 mL, 500 mL, Intravenous, Once, [name], Sumayya, MD .  sodium chloride flush (NS) 0.9 % injection 3 mL, 3  mL, Intravenous, Q12H, Lenore Cordia, MD, 3 mL at 10/30/19 0914 .  tamsulosin (FLOMAX) capsule 0.4 mg, 0.4 mg, Oral, Daily, Lavina Hamman, MD, 0.4 mg at 10/30/19 2458 .  zinc sulfate capsule 220 mg, 220 mg, Oral, Daily, Zada Finders R, MD, 220 mg at 10/29/19 2154    ALLERGIES   Other     REVIEW OF SYSTEMS    Review of Systems:  Gen:  Denies  fever, sweats, chills weigh loss  HEENT: Denies blurred vision, double vision, ear pain, eye pain, hearing loss, nose bleeds, sore throat Cardiac:  No dizziness, chest pain or heaviness, chest tightness,edema Resp:   Reports minimal cough,sputum porduction, but significant shortness of breath, however without wheezing, and no hemoptysis,  Gi: Denies swallowing  difficulty, stomach pain, nausea or vomiting, diarrhea, constipation, bowel incontinence Gu:  Denies bladder incontinence, burning urine Ext:   Denies Joint pain, stiffness or swelling Skin: Denies  skin rash, easy bruising or bleeding or hives Endoc:  Denies polyuria, polydipsia , polyphagia or weight change Psych:   Denies depression, insomnia or hallucinations   Other:  All other systems negative   VS: BP (!) 104/54   Pulse 65   Temp (!) 97.5 F (36.4 C) (Oral)   Resp 19   Ht 5\' 8"  (1.727 m)   Wt 98.7 kg   SpO2 94%   BMI 33.07 kg/m      PHYSICAL EXAM    GENERAL:NAD, no fevers, chills, no weakness no fatigue HEAD: Normocephalic, atraumatic.  EYES: Pupils equal, round, reactive to light. Extraocular muscles intact. No scleral icterus.  MOUTH: Moist mucosal membrane. Dentition intact. No abscess noted.  EAR, NOSE, THROAT: Clear without exudates. No external lesions.  NECK: Supple. No thyromegaly. No nodules. No JVD.  PULMONARY: Diffuse coarse rhonchi bilaterally  No wheezes no crackles CARDIOVASCULAR: S1 and S2. Regular rate and rhythm. No murmurs, rubs, or gallops. No edema. Pedal pulses 2+ bilaterally.  GASTROINTESTINAL: Soft, nontender, nondistended. No masses. Positive bowel sounds. No hepatosplenomegaly.  MUSCULOSKELETAL: No swelling, clubbing, or edema. Range of motion full in all extremities.  NEUROLOGIC: Cranial nerves II through XII are intact. No gross focal neurological deficits. Sensation intact. Reflexes intact.  SKIN: No ulceration, lesions, rashes, or cyanosis. Skin warm and dry. Turgor intact.  PSYCHIATRIC: Mood, affect within normal limits. The patient is awake, alert and oriented x 3. Insight, judgment intact.       IMAGING    CT ANGIO CHEST PE W OR WO CONTRAST  Result Date: 10/26/2019 CLINICAL DATA:  Shortness of breath, COVID positive EXAM: CT ANGIOGRAPHY CHEST WITH CONTRAST TECHNIQUE: Multidetector CT imaging of the chest was performed using the  standard protocol during bolus administration of intravenous contrast. Multiplanar CT image reconstructions and MIPs were obtained to evaluate the vascular anatomy. CONTRAST:  54mL OMNIPAQUE IOHEXOL 350 MG/ML SOLN COMPARISON:  None. FINDINGS: Cardiovascular: Satisfactory opacification of the pulmonary arteries to the proximal segmental level. No evidence of pulmonary embolism. Normal heart size. No pericardial effusion. Coronary artery and thoracic aorta atherosclerotic calcification. Mediastinum/Nodes: No mediastinal, hilar, or axillary adenopathy. Thyroid is unremarkable. Esophagus appears normal. Lungs/Pleura: There is multifocal consolidation greatest at the periphery and lung bases. No pleural effusion or pneumothorax. Upper Abdomen: No acute abnormality. Musculoskeletal: No chest wall abnormality. No acute or significant osseous findings. Review of the MIP images confirms the above findings. IMPRESSION: No evidence of acute pulmonary embolism. Bilateral consolidative opacities likely reflecting pneumonia (including COVID-19). Aortic Atherosclerosis (ICD10-I70.0). Electronically Signed   By: Malachi Carl  Patel M.D.   On: 10/26/2019 17:03   US Venous Img Lower Bilateral (DVT)  Result Date: 10/26/2019 CLINICAL DATA:  Lower extremity edema for 1 day EXAM: BILATERAL LOWER EXTREMITY VENOUS DOPPLER ULTRASOUND TECHNIQUE: Gray-scale sonography with graded compression, as well as color Doppler and duplex ultrasound were performed to evaluate the lower extremity deep venous systems from the level of the common femoral vein and including the common femoral, femoral, profunda femoral, popliteal and calf veins including the posterior tibial, peroneal and gastrocnemius veins when visible. The superficial great saphenous vein was also interrogated. Spectral Doppler was utilized to evaluate flow at rest and with distal augmentation maneuvers in the common femoral, femoral and popliteal veins. COMPARISON:  None. FINDINGS: RIGHT  LOWER EXTREMITY Common Femoral Vein: No evidence of thrombus. Normal compressibility, respiratory phasicity and response to augmentation. Saphenofemoral Junction: No evidence of thrombus. Normal compressibility and flow on color Doppler imaging. Profunda Femoral Vein: No evidence of thrombus. Normal compressibility and flow on color Doppler imaging. Femoral Vein: No evidence of thrombus. Normal compressibility, respiratory phasicity and response to augmentation. Popliteal Vein: No evidence of thrombus. Normal compressibility, respiratory phasicity and response to augmentation. Calf Veins: Calf posterior tibial and peroneal veins demonstrate intraluminal thrombus appearing occlusive in the posterior tibial vein and nonocclusive in the peroneal vein. Vessels are noncompressible. Very low thrombus burden. LEFT LOWER EXTREMITY Common Femoral Vein: No evidence of thrombus. Normal compressibility, respiratory phasicity and response to augmentation. Saphenofemoral Junction: No evidence of thrombus. Normal compressibility and flow on color Doppler imaging. Profunda Femoral Vein: No evidence of thrombus. Normal compressibility and flow on color Doppler imaging. Femoral Vein: No evidence of thrombus. Normal compressibility, respiratory phasicity and response to augmentation. Popliteal Vein: No evidence of thrombus. Normal compressibility, respiratory phasicity and response to augmentation. Calf Veins: No evidence of thrombus. Normal compressibility and flow on color Doppler imaging. IMPRESSION: Positive exam for calf region right posterior tibial and peroneal vein DVT. Very low thrombus burden. No propagation into the popliteal or femoral veins. Negative for left lower extremity DVT. Electronically Signed   By: Judie Petit.  Shick M.D.   On: 10/26/2019 11:54   DG Chest Port 1 View  Result Date: 10/30/2019 CLINICAL DATA:  Shortness of breath. EXAM: PORTABLE CHEST 1 VIEW COMPARISON:  October 28, 2019. FINDINGS: Stable  cardiomediastinal silhouette. No pneumothorax or pleural effusion is noted. Stable bilateral patchy airspace opacities are noted consistent with multifocal pneumonia. Bony thorax is unremarkable. IMPRESSION: Stable bilateral multifocal pneumonia. Electronically Signed   By: Lupita Raider M.D.   On: 10/30/2019 09:02   DG Chest Port 1 View  Result Date: 10/28/2019 CLINICAL DATA:  79 year old male with pneumonia. EXAM: PORTABLE CHEST 1 VIEW COMPARISON:  Chest radiograph dated 10/24/2019 and CT dated 10/26/2019. FINDINGS: Bilateral mid to lower lung field subpleural and streaky densities appear similar or slightly improved since the prior radiograph. No large pleural effusion or pneumothorax. Stable cardiomediastinal silhouette. Atherosclerotic calcification of the aorta. No acute osseous pathology. IMPRESSION: Similar or slightly improved pulmonary opacities. Continued follow-up recommended. Electronically Signed   By: Elgie Collard M.D.   On: 10/28/2019 20:37   DG Chest Port 1 View  Result Date: 10/24/2019 CLINICAL DATA:  Shortness of breath. EXAM: PORTABLE CHEST 1 VIEW COMPARISON:  October 20, 2019. FINDINGS: Stable cardiomediastinal silhouette. Atherosclerosis of thoracic aorta is noted. No pneumothorax or pleural effusion is noted. Mildly increased bilateral patchy ill-defined airspace opacities are noted consistent with multifocal pneumonia. Bony thorax is unremarkable. IMPRESSION: Aortic atherosclerosis. Mildly increased bilateral  patchy airspace opacities are noted consistent with multifocal pneumonia. Electronically Signed   By: Lupita Raider M.D.   On: 10/24/2019 08:08   DG Chest Port 1 View  Result Date: 10/20/2019 CLINICAL DATA:  Shortness of breath, COVID exposure. EXAM: PORTABLE CHEST 1 VIEW COMPARISON:  Chest radiograph dated 02/10/2011. FINDINGS: The heart appears enlarged. Vascular calcifications are seen in the aortic arch. Moderate airspace opacities are seen in the peripheral right  mid lung and in the left lower lung. There is no pleural effusion or pneumothorax. The osseous structures are intact. IMPRESSION: Moderate airspace opacities in the peripheral right mid lung and left lower lung may reflect COVID-19 pneumonia. Electronically Signed   By: Romona Curls M.D.   On: 10/20/2019 18:11        ASSESSMENT/PLAN   Acute hypoxemic respiratory failure due to COVID 19 induced pneumonia - Currently increased from 5 to 7L/min [place] - patient is s/p Vecluri and Actemra -CT chest - reviewed by me as above with bilateral multifocal atypical pneumonia - Solumedrol - changing to 40 BID  - Adding colchicine 0.6mg  PO daily - Chest phyisotherapy - bed PT - please encourage patient to use IS and flutter valve - I worked closely with patient and he is now able to inhale Vt at  - patient is Net negative 4.5 L  And is borderline hypotensive - will  diuresis - Unable to add any nebulized medication due to COVID precautions -PT/OT already ordered -will continue to follow along with you  - repeat CXR in am ordered   DVT   - patient already on anticoagulation   - no signs of bleeding       Thank you for allowing me to participate in the care of this patient.     Patient/Family are satisfied with care plan and all questions have been answered.  This document was prepared using Dragon voice recognition software and may include unintentional dictation errors.     Vida Rigger, M.D.  Division of Pulmonary & Critical Care Medicine  [facility]

## 2019-10-30 NOTE — Progress Notes (Signed)
PROGRESS NOTE    Walter Baxter  WYO:[id number] DOB: Mar 03, 1941 DOA: 10/20/2019 PCP: Lupita Raider, MD   Brief Narrative:  Walter Baxter a 79 y.o.malewith medical history significant fordiet-controlled diabetes, hypertension, and hyperlipidemia who presents to the ED for evaluation of shortness of breath.Patient reports new sudden onset of significant generalized weakness, fatigue, and shortness of breath beginning the night before admission. He has had associated intermittent fevers, chills, loss of taste/smell, and rare nonproductive cough. Admitted for COVID-19 pneumonia, treated with remdesivir, Decadron and Actemra.  Continue to require O2 supplement Wife was also admitted for COVID-19 infection.  Subjective: Patient was feeling better. No urinary symptoms. States that breathing seems improving.  Assessment & Plan:   Principal Problem:   Acute hypoxemic respiratory failure due to COVID-19 [facility] Active Problems:   Essential hypertension   Hyperlipidemia   Type 2 diabetes mellitus (HCC)  Acute hypoxic failure secondary to COVID-19 pneumonia.  Completed a course of remdesivir.  He was given Actemra X I. Continue to remain hypoxic with worsening oxygen requirement, now saturating on 7 L with HFNC. Lower extremity Doppler was positive for thrombus in right posterior tibial and peroneal vein.  D-dimer remained stable above 4000.  Rest of the inflammatory markers improving. CTA was negative for PE. -Switch Decadron with Solu-Medrol 40 mg twice daily. -Consult pulmonary for worsening hypoxia-appreciate their recommendations. -He was started on Mucomyst. -Continue Xarelto for DVT. -Continue zinc and vitamin C -Continue incentive spirometry. -Might have to go home with home oxygen. -PT/OT recommending SNF placement but patient and wife decided to go back home with home health services.  Leukocytosis.  Patient has worsening leukocytosis with WBCs of 21.2 today.  Partly  can be due to steroid.  Has mild hypothermia. -We will check chest x-ray, UA and procalcitonin to rule out any superadded bacterial infection. CXR- Stable UA -With leukocytes and bacteria- no urinary symptoms- Will check Urine Cx. PCT. <0.10 -Check MRCA swab. -Hold Abx for now.  Hypertension.  Blood pressure was within goal. -Continue to monitor. -Continue Lopressor if heart rate within normal limit. -Can use hydralazine as needed for systolic above 160.  Type 2 diabetes mellitus.  Well-controlled with A1c of 6.1 And had some elevated blood sugar levels most likely secondary to steroid. -Continue to monitor and SSI.  Acute urinary retention.  Unclear etiology, most likely secondary to BPH. Has been resolved.  Patient is urinating without any difficulty. -Continue Flomax.  Objective: Vitals:   10/30/19 0130 10/30/19 0230 10/30/19 0400 10/30/19 0710  BP:   (!) 104/54   Pulse: 61 62 62 (!) 59  Resp: 18 17 19 20   Temp:      TempSrc:      SpO2: 96% 96% 96% 94%  Weight:      Height:        Intake/Output Summary (Last 24 hours) at 10/30/2019 0811 Last data filed at 10/30/2019 0710 Gross per 24 hour  Intake 840 ml  Output 1075 ml  Net -235 ml   Filed Weights   10/20/19 1724 10/20/19 2331 10/22/19 0532  Weight: 105.2 kg 99.9 kg 98.7 kg    Examination:  General exam: Conically ill-appearing elderly man, appears calm and comfortable  Respiratory system: Clear bilaterally. Respiratory effort normal. Cardiovascular system: S1 & S2 heard, RRR. No JVD, murmurs, rubs, gallops or clicks. Gastrointestinal system: Soft, nontender, nondistended, bowel sounds positive. Central nervous system: Alert and oriented. No focal neurological deficits.Symmetric 5 x 5 power. Extremities: No edema, no cyanosis, pulses intact  and symmetrical. Skin: No rashes, lesions or ulcers Psychiatry: Judgement and insight appear normal.    DVT prophylaxis: Xarelto Code Status: Full Family Communication:  Wife was updated on phone. Disposition Plan: Pending improvement, most likely next couple of days.  Might go home with home oxygen if fail to wean off.  He will go back home with home health services.  Consultants:   Pulmonary  Procedures:  Antimicrobials:   Data Reviewed: I have personally reviewed following labs and imaging studies  CBC: Recent Labs  Lab 10/24/19 0433 10/25/19 0731 10/28/19 0600 10/30/19 0352  WBC 12.8* 16.2* 16.7* 21.2*  NEUTROABS 10.6* 14.7*  --   --   HGB 13.5 14.3 15.0 14.4  HCT 39.2 42.6 44.3 43.9  MCV 85.2 86.6 88.4 90.0  PLT 310 271 242 200   Basic Metabolic Panel: Recent Labs  Lab 10/24/19 0433 10/25/19 0731 10/26/19 0841 10/28/19 0600 10/30/19 0352  NA 141 139 138 138 138  K 3.3* 4.7 4.4 4.8 4.9  CL 108 108 107 107 107  CO2 25 22 21* 22 22  GLUCOSE 142* 248* 201* 221* 240*  BUN 25* 29* 29* 31* 36*  CREATININE 0.95 1.01 0.96 0.96 1.02  CALCIUM 7.5* 7.6* 7.6* 7.8* 7.8*  MG 2.1 2.0  --   --   --   PHOS 2.9 3.5  --   --   --    GFR: Estimated Creatinine Clearance: 69.1 mL/min (by C-G formula based on SCr of 1.02 mg/dL). Liver Function Tests: Recent Labs  Lab 10/24/19 0433 10/25/19 0731  AST 69* 42*  ALT 40 40  ALKPHOS 58 58  BILITOT 0.7 0.6  PROT 5.2* 5.4*  ALBUMIN 2.1* 2.3*   No results for input(s): LIPASE, AMYLASE in the last 168 hours. No results for input(s): AMMONIA in the last 168 hours. Coagulation Profile: No results for input(s): INR, PROTIME in the last 168 hours. Cardiac Enzymes: No results for input(s): CKTOTAL, CKMB, CKMBINDEX, TROPONINI in the last 168 hours. BNP (last 3 results) No results for input(s): PROBNP in the last 8760 hours. HbA1C: No results for input(s): HGBA1C in the last 72 hours. CBG: Recent Labs  Lab 10/29/19 0733 10/29/19 1131 10/29/19 1647 10/29/19 2140 10/30/19 0737  GLUCAP 215* 245* 162* 286* 207*   Lipid Profile: No results for input(s): CHOL, HDL, LDLCALC, TRIG, CHOLHDL,  LDLDIRECT in the last 72 hours. Thyroid Function Tests: No results for input(s): TSH, T4TOTAL, FREET4, T3FREE, THYROIDAB in the last 72 hours. Anemia Panel: No results for input(s): VITAMINB12, FOLATE, FERRITIN, TIBC, IRON, RETICCTPCT in the last 72 hours. Sepsis Labs: Recent Labs  Lab 10/26/19 0841  PROCALCITON <0.10    Recent Results (from the past 240 hour(s))  Respiratory Panel by RT PCR (Flu A&B, Covid) - Nasopharyngeal Swab     Status: Abnormal   Collection Time: 10/20/19  5:30 PM   Specimen: Nasopharyngeal Swab  Result Value Ref Range Status   SARS Coronavirus 2 by RT PCR POSITIVE (A) NEGATIVE Final    Comment: RESULT CALLED TO, READ BACK BY AND VERIFIED WITH: BRIANNA CHAPMON @1900  10/20/19 MJU (NOTE) SARS-CoV-2 target nucleic acids are DETECTED. SARS-CoV-2 RNA is generally detectable in upper respiratory specimens  during the acute phase of infection. Positive results are indicative of the presence of the identified virus, but do not rule out bacterial infection or co-infection with other pathogens not detected by the test. Clinical correlation with patient history and other diagnostic information is necessary to determine patient infection status. The  expected result is Negative. Fact Sheet for Patients:  PinkCheek.be Fact Sheet for Healthcare Providers: GravelBags.it This test is not yet approved or cleared by the [place] FDA and  has been authorized for detection and/or diagnosis of SARS-CoV-2 by FDA under an Emergency Use Authorization (EUA).  This EUA will remain in effect (meaning this test can be used) for  the duration of  the COVID-19 declaration under Section 564(b)(1) of the Act, 21 U.S.C. section 360bbb-3(b)(1), unless the authorization is terminated or revoked sooner.    Influenza A by PCR NEGATIVE NEGATIVE Final   Influenza B by PCR NEGATIVE NEGATIVE Final    Comment: (NOTE) The Xpert  Xpress SARS-CoV-2/FLU/RSV assay is intended as an aid in  the diagnosis of influenza from Nasopharyngeal swab specimens and  should not be used as a sole basis for treatment. Nasal washings and  aspirates are unacceptable for Xpert Xpress SARS-CoV-2/FLU/RSV  testing. Fact Sheet for Patients: PinkCheek.be Fact Sheet for Healthcare Providers: GravelBags.it This test is not yet approved or cleared by the [place] FDA and  has been authorized for detection and/or diagnosis of SARS-CoV-2 by  FDA under an Emergency Use Authorization (EUA). This EUA will remain  in effect (meaning this test can be used) for the duration of the  Covid-19 declaration under Section 564(b)(1) of the Act, 21  U.S.C. section 360bbb-3(b)(1), unless the authorization is  terminated or revoked. Performed at [facility], [address], [place], [place] [address]      Radiology Studies: [facility] Chest [place] 1 View  Result Date: 10/28/2019 CLINICAL DATA:  79 year old male with pneumonia. EXAM: PORTABLE CHEST 1 VIEW COMPARISON:  Chest radiograph dated 10/24/2019 and CT dated 10/26/2019. FINDINGS: Bilateral mid to lower lung field subpleural and streaky densities appear similar or slightly improved since the prior radiograph. No large pleural effusion or pneumothorax. Stable cardiomediastinal silhouette. Atherosclerotic calcification of the aorta. No acute osseous pathology. IMPRESSION: Similar or slightly improved pulmonary opacities. Continued follow-up recommended. Electronically Signed   By: Anner Crete M.D.   On: 10/28/2019 20:37    Scheduled Meds: . vitamin C  500 mg Oral Daily  . colchicine  0.6 mg Oral Daily  . dextromethorphan-guaiFENesin  1 tablet Oral BID  . famotidine  20 mg Oral BID  . insulin aspart  0-5 Units Subcutaneous QHS  . insulin aspart  0-9 Units Subcutaneous TID WC  . Ipratropium-Albuterol  1 puff Inhalation Q6H  .  methylPREDNISolone (SOLU-MEDROL) injection  40 mg Intravenous Q12H  . rivaroxaban  15 mg Oral BID AC   Followed by  . [START ON 11/16/2019] rivaroxaban  20 mg Oral Q supper  . sodium chloride flush  3 mL Intravenous Q12H  . tamsulosin  0.4 mg Oral Daily  . zinc sulfate  220 mg Oral Daily   Continuous Infusions: . sodium chloride Stopped (10/24/19 1900)  . sodium chloride       LOS: 10 days   Time spent: 45 minutes.  Lorella Nimrod, MD [facility]  If 7PM-7AM, please contact night-coverage 10/30/2019, 8:11 AM   This record has been created using Dragon voice recognition software. Errors have been sought and corrected,but may not always be located. Such creation errors do not reflect on the standard of care.

## 2019-10-31 LAB — GLUCOSE, CAPILLARY
Glucose-Capillary: 222 mg/dL — ABNORMAL HIGH (ref 70–99)
Glucose-Capillary: 275 mg/dL — ABNORMAL HIGH (ref 70–99)
Glucose-Capillary: 290 mg/dL — ABNORMAL HIGH (ref 70–99)
Glucose-Capillary: 300 mg/dL — ABNORMAL HIGH (ref 70–99)

## 2019-10-31 LAB — PROCALCITONIN: Procalcitonin: 0.16 ng/mL

## 2019-10-31 MED ORDER — SULFAMETHOXAZOLE-TRIMETHOPRIM 400-80 MG PO TABS
1.0000 | ORAL_TABLET | Freq: Two times a day (BID) | ORAL | Status: DC
  Administered 2019-10-31 – 2019-11-01 (×2): 1 via ORAL
  Filled 2019-10-31 (×3): qty 1

## 2019-10-31 MED ORDER — SODIUM CHLORIDE 0.9 % IV SOLN
2.0000 g | INTRAVENOUS | Status: DC
  Administered 2019-10-31: 17:00:00 2 g via INTRAVENOUS
  Filled 2019-10-31 (×3): qty 20

## 2019-10-31 NOTE — Progress Notes (Signed)
Pt with Urine Cx positive for ecoli. Text page sent to MD.

## 2019-10-31 NOTE — Progress Notes (Signed)
Pt to [facility] at 0920 this morning for BM. Required O2 increase to 15 LPM HFNC due to significant exertional dyspnea. Pt dropped to low 80s on 15 LPM HFNC.   Primary MD, Dr Nelson Chimes made aware and pulmonologist also aware of desaturation, dyspnea on exertion, and very slow recovery. Pt required 15 HFNC for approximately one hour total, during and after minimal activity and he is now being tapered down slowly as tolerated. Pt at 9 LPM HFNC. Pt given his solumedrol dose a bit earlier to help with recovery. Dr Karna Christmas aware.   Pt in consideration for transfer to [facility] from a pulmonary perspective and/or ICU from a hospitalist perspective. Will continue to attempt taper down to patient current baseline oxygen admin of 7LP HFNC.

## 2019-10-31 NOTE — Progress Notes (Signed)
-   Pulmonary Medicine          Date: 10/31/2019,   MRN# [id number] [place] [birth year]     AdmissionWeight: 105.2 kg                 CurrentWeight: 98.7 kg      CHIEF COMPLAINT:   Increased O2 requirement with COVID 19 pneumonitis   HISTORY OF PRESENT ILLNESS   Patient is clinically improved.  He is still requiring significant support with supplemental O2 at 7L/min during my evaluation this morning.    Patient is on steroids with leukocytosis. I have added bactrim for pcp prophylaxis.  He is also on rocephin empirically.   PAST MEDICAL HISTORY   Past Medical History:  Diagnosis Date  . Allergic rhinitis   . Angio-edema   . Chronic bronchitis (HCC)   . Erectile dysfunction   . Essential hypertension   . Hyperlipidemia   . Obesity   . Type 2 diabetes mellitus (HCC)   [id number]   SURGICAL HISTORY   Past Surgical History:  Procedure Laterality Date  . ADENOIDECTOMY    . TONSILLECTOMY       FAMILY HISTORY   Family History  Problem Relation Age of Onset  . Lupus Sister      SOCIAL HISTORY   Social History   Tobacco Use  . Smoking status: Never Smoker  . Smokeless tobacco: Never Used  Substance Use Topics  . Alcohol use: Not Currently  . Drug use: Never     MEDICATIONS    Home Medication:    Current Medication:  Current Facility-Administered Medications:  .  0.9 %  sodium chloride infusion, , Intravenous, PRN, Rolly Salter, MD, Stopped at 10/24/19 1900 .  acetaminophen (TYLENOL) tablet 650 mg, 650 mg, Oral, Q6H PRN, Charlsie Quest, MD, 650 mg at 10/28/19 2009 .  ascorbic acid (VITAMIN C) tablet 500 mg, 500 mg, Oral, Daily, Darreld Mclean R, MD, 500 mg at 10/30/19 1940 .  cefTRIAXone (ROCEPHIN) 2 g in sodium chloride 0.9 % 100 mL IVPB, 2 g, Intravenous, Q24H, Arnetha Courser, MD, Stopped at 10/31/19 1728 .  colchicine tablet 0.6 mg, 0.6 mg, Oral, Daily, Liah Morr, MD, 0.6 mg at 10/31/19 0830 .   dextromethorphan-guaiFENesin (MUCINEX DM) 30-600 MG per 12 hr tablet 1 tablet, 1 tablet, Oral, BID, Karna Christmas, Eisa Conaway, MD, 1 tablet at 10/31/19 0829 .  famotidine (PEPCID) tablet 20 mg, 20 mg, Oral, BID, Darreld Mclean R, MD, 20 mg at 10/31/19 0830 .  guaiFENesin-dextromethorphan (ROBITUSSIN DM) 100-10 MG/5ML syrup 10 mL, 10 mL, Oral, Q4H PRN, Darreld Mclean R, MD, 10 mL at 10/29/19 0831 .  insulin aspart (novoLOG) injection 0-5 Units, 0-5 Units, Subcutaneous, QHS, Rolly Salter, MD, 3 Units at 10/30/19 2201 .  insulin aspart (novoLOG) injection 0-9 Units, 0-9 Units, Subcutaneous, TID WC, Rolly Salter, MD, 5 Units at 10/31/19 1653 .  Ipratropium-Albuterol (COMBIVENT) respimat 1 puff, 1 puff, Inhalation, Q6H, Charlsie Quest, MD, 1 puff at 10/31/19 1313 .  methylPREDNISolone sodium succinate (SOLU-MEDROL) 40 mg/mL injection 40 mg, 40 mg, Intravenous, Q12H, Karna Christmas, Tanicia Wolaver, MD, 40 mg at 10/31/19 1017 .  ondansetron (ZOFRAN) tablet 4 mg, 4 mg, Oral, Q6H PRN **OR** ondansetron (ZOFRAN) injection 4 mg, 4 mg, Intravenous, Q6H PRN, Charlsie Quest, MD .  Rivaroxaban (XARELTO) tablet 15 mg, 15 mg, Oral, BID AC, 15 mg at 10/31/19 1652 **FOLLOWED BY** [START ON 11/16/2019] rivaroxaban (XARELTO) tablet 20 mg, 20 mg, Oral, Q supper, [name],  Sumayya, MD .  sodium chloride 0.9 % bolus 500 mL, 500 mL, Intravenous, Once, [name], Sumayya, MD .  sodium chloride flush (NS) 0.9 % injection 3 mL, 3 mL, Intravenous, Q12H, Zada Finders R, MD, 3 mL at 10/31/19 0831 .  sulfamethoxazole-trimethoprim (BACTRIM) 400-80 MG per tablet 1 tablet, 1 tablet, Oral, Q12H, Vega Withrow, MD .  tamsulosin (FLOMAX) capsule 0.4 mg, 0.4 mg, Oral, Daily, Lavina Hamman, MD, 0.4 mg at 10/31/19 0831 .  zinc sulfate capsule 220 mg, 220 mg, Oral, Daily, Zada Finders R, MD, 220 mg at 10/30/19 1940    ALLERGIES   Other     REVIEW OF SYSTEMS    Review of Systems:  Gen:  Denies  fever, sweats, chills weigh loss  HEENT: Denies blurred  vision, double vision, ear pain, eye pain, hearing loss, nose bleeds, sore throat Cardiac:  No dizziness, chest pain or heaviness, chest tightness,edema Resp:   Reports minimal cough,sputum porduction, but significant shortness of breath, however without wheezing, and no hemoptysis,  Gi: Denies swallowing difficulty, stomach pain, nausea or vomiting, diarrhea, constipation, bowel incontinence Gu:  Denies bladder incontinence, burning urine Ext:   Denies Joint pain, stiffness or swelling Skin: Denies  skin rash, easy bruising or bleeding or hives Endoc:  Denies polyuria, polydipsia , polyphagia or weight change Psych:   Denies depression, insomnia or hallucinations   Other:  All other systems negative   VS: BP 129/63   Pulse 81   Temp 97.6 F (36.4 C)   Resp (!) 22   Ht 5\' 8"  (1.727 m)   Wt 98.7 kg   SpO2 95%   BMI 33.07 kg/m      PHYSICAL EXAM    GENERAL:NAD, no fevers, chills, no weakness no fatigue HEAD: Normocephalic, atraumatic.  EYES: Pupils equal, round, reactive to light. Extraocular muscles intact. No scleral icterus.  MOUTH: Moist mucosal membrane. Dentition intact. No abscess noted.  EAR, NOSE, THROAT: Clear without exudates. No external lesions.  NECK: Supple. No thyromegaly. No nodules. No JVD.  PULMONARY: Diffuse coarse rhonchi bilaterally  No wheezes no crackles CARDIOVASCULAR: S1 and S2. Regular rate and rhythm. No murmurs, rubs, or gallops. No edema. Pedal pulses 2+ bilaterally.  GASTROINTESTINAL: Soft, nontender, nondistended. No masses. Positive bowel sounds. No hepatosplenomegaly.  MUSCULOSKELETAL: No swelling, clubbing, or edema. Range of motion full in all extremities.  NEUROLOGIC: Cranial nerves II through XII are intact. No gross focal neurological deficits. Sensation intact. Reflexes intact.  SKIN: No ulceration, lesions, rashes, or cyanosis. Skin warm and dry. Turgor intact.  PSYCHIATRIC: Mood, affect within normal limits. The patient is awake, alert  and oriented x 3. Insight, judgment intact.       IMAGING    CT ANGIO CHEST PE W OR WO CONTRAST  Result Date: 10/26/2019 CLINICAL DATA:  Shortness of breath, COVID positive EXAM: CT ANGIOGRAPHY CHEST WITH CONTRAST TECHNIQUE: Multidetector CT imaging of the chest was performed using the standard protocol during bolus administration of intravenous contrast. Multiplanar CT image reconstructions and MIPs were obtained to evaluate the vascular anatomy. CONTRAST:  73mL OMNIPAQUE IOHEXOL 350 MG/ML SOLN COMPARISON:  None. FINDINGS: Cardiovascular: Satisfactory opacification of the pulmonary arteries to the proximal segmental level. No evidence of pulmonary embolism. Normal heart size. No pericardial effusion. Coronary artery and thoracic aorta atherosclerotic calcification. Mediastinum/Nodes: No mediastinal, hilar, or axillary adenopathy. Thyroid is unremarkable. Esophagus appears normal. Lungs/Pleura: There is multifocal consolidation greatest at the periphery and lung bases. No pleural effusion or pneumothorax. Upper Abdomen: No acute  abnormality. Musculoskeletal: No chest wall abnormality. No acute or significant osseous findings. Review of the MIP images confirms the above findings. IMPRESSION: No evidence of acute pulmonary embolism. Bilateral consolidative opacities likely reflecting pneumonia (including COVID-19). Aortic Atherosclerosis (ICD10-I70.0). Electronically Signed   By: Guadlupe Spanish M.D.   On: 10/26/2019 17:03   US Venous Img Lower Bilateral (DVT)  Result Date: 10/26/2019 CLINICAL DATA:  Lower extremity edema for 1 day EXAM: BILATERAL LOWER EXTREMITY VENOUS DOPPLER ULTRASOUND TECHNIQUE: Gray-scale sonography with graded compression, as well as color Doppler and duplex ultrasound were performed to evaluate the lower extremity deep venous systems from the level of the common femoral vein and including the common femoral, femoral, profunda femoral, popliteal and calf veins including the posterior  tibial, peroneal and gastrocnemius veins when visible. The superficial great saphenous vein was also interrogated. Spectral Doppler was utilized to evaluate flow at rest and with distal augmentation maneuvers in the common femoral, femoral and popliteal veins. COMPARISON:  None. FINDINGS: RIGHT LOWER EXTREMITY Common Femoral Vein: No evidence of thrombus. Normal compressibility, respiratory phasicity and response to augmentation. Saphenofemoral Junction: No evidence of thrombus. Normal compressibility and flow on color Doppler imaging. Profunda Femoral Vein: No evidence of thrombus. Normal compressibility and flow on color Doppler imaging. Femoral Vein: No evidence of thrombus. Normal compressibility, respiratory phasicity and response to augmentation. Popliteal Vein: No evidence of thrombus. Normal compressibility, respiratory phasicity and response to augmentation. Calf Veins: Calf posterior tibial and peroneal veins demonstrate intraluminal thrombus appearing occlusive in the posterior tibial vein and nonocclusive in the peroneal vein. Vessels are noncompressible. Very low thrombus burden. LEFT LOWER EXTREMITY Common Femoral Vein: No evidence of thrombus. Normal compressibility, respiratory phasicity and response to augmentation. Saphenofemoral Junction: No evidence of thrombus. Normal compressibility and flow on color Doppler imaging. Profunda Femoral Vein: No evidence of thrombus. Normal compressibility and flow on color Doppler imaging. Femoral Vein: No evidence of thrombus. Normal compressibility, respiratory phasicity and response to augmentation. Popliteal Vein: No evidence of thrombus. Normal compressibility, respiratory phasicity and response to augmentation. Calf Veins: No evidence of thrombus. Normal compressibility and flow on color Doppler imaging. IMPRESSION: Positive exam for calf region right posterior tibial and peroneal vein DVT. Very low thrombus burden. No propagation into the popliteal or  femoral veins. Negative for left lower extremity DVT. Electronically Signed   By: Judie Petit.  Shick M.D.   On: 10/26/2019 11:54   DG Chest Port 1 View  Result Date: 10/30/2019 CLINICAL DATA:  Shortness of breath. EXAM: PORTABLE CHEST 1 VIEW COMPARISON:  October 28, 2019. FINDINGS: Stable cardiomediastinal silhouette. No pneumothorax or pleural effusion is noted. Stable bilateral patchy airspace opacities are noted consistent with multifocal pneumonia. Bony thorax is unremarkable. IMPRESSION: Stable bilateral multifocal pneumonia. Electronically Signed   By: Lupita Raider M.D.   On: 10/30/2019 09:02   DG Chest Port 1 View  Result Date: 10/28/2019 CLINICAL DATA:  79 year old male with pneumonia. EXAM: PORTABLE CHEST 1 VIEW COMPARISON:  Chest radiograph dated 10/24/2019 and CT dated 10/26/2019. FINDINGS: Bilateral mid to lower lung field subpleural and streaky densities appear similar or slightly improved since the prior radiograph. No large pleural effusion or pneumothorax. Stable cardiomediastinal silhouette. Atherosclerotic calcification of the aorta. No acute osseous pathology. IMPRESSION: Similar or slightly improved pulmonary opacities. Continued follow-up recommended. Electronically Signed   By: Elgie Collard M.D.   On: 10/28/2019 20:37   DG Chest Port 1 View  Result Date: 10/24/2019 CLINICAL DATA:  Shortness of breath. EXAM: PORTABLE CHEST 1  VIEW COMPARISON:  October 20, 2019. FINDINGS: Stable cardiomediastinal silhouette. Atherosclerosis of thoracic aorta is noted. No pneumothorax or pleural effusion is noted. Mildly increased bilateral patchy ill-defined airspace opacities are noted consistent with multifocal pneumonia. Bony thorax is unremarkable. IMPRESSION: Aortic atherosclerosis. Mildly increased bilateral patchy airspace opacities are noted consistent with multifocal pneumonia. Electronically Signed   By: Lupita Raider M.D.   On: 10/24/2019 08:08   DG Chest Port 1 View  Result Date:  10/20/2019 CLINICAL DATA:  Shortness of breath, COVID exposure. EXAM: PORTABLE CHEST 1 VIEW COMPARISON:  Chest radiograph dated 02/10/2011. FINDINGS: The heart appears enlarged. Vascular calcifications are seen in the aortic arch. Moderate airspace opacities are seen in the peripheral right mid lung and in the left lower lung. There is no pleural effusion or pneumothorax. The osseous structures are intact. IMPRESSION: Moderate airspace opacities in the peripheral right mid lung and left lower lung may reflect COVID-19 pneumonia. Electronically Signed   By: Romona Curls M.D.   On: 10/20/2019 18:11        ASSESSMENT/PLAN   Acute hypoxemic respiratory failure due to COVID 19 induced pneumonia - Currently increased from 5 to 7L/min [place] - patient is s/p Vecluri and Actemra -CT chest - reviewed by me as above with bilateral multifocal atypical pneumonia - Solumedrol - changing to 40 BID  - Adding colchicine 0.6mg  PO daily - Chest phyisotherapy - bed PT - please encourage patient to use IS and flutter valve - I worked closely with patient and he is now able to inhale Vt at  - patient is Net negative 4.5 L  And is borderline hypotensive - will  diuresis - Unable to add any nebulized medication due to COVID precautions -PT/OT already ordered -will continue to follow along with you  - repeat CXR in am ordered   DVT   - patient already on anticoagulation   - no signs of bleeding       Thank you for allowing me to participate in the care of this patient.     Patient/Family are satisfied with care plan and all questions have been answered.  This document was prepared using Dragon voice recognition software and may include unintentional dictation errors.     Vida Rigger, M.D.  Division of Pulmonary & Critical Care Medicine  [facility]

## 2019-10-31 NOTE — Progress Notes (Signed)
PROGRESS NOTE    Walter Baxter  AYT:[id number] DOB: Jan 21, 1941 DOA: 10/20/2019 PCP: Lupita Raider, MD   Brief Narrative:  Walter Baxter a 79 y.o.malewith medical history significant fordiet-controlled diabetes, hypertension, and hyperlipidemia who presents to the ED for evaluation of shortness of breath.Patient reports new sudden onset of significant generalized weakness, fatigue, and shortness of breath beginning the night before admission. He has had associated intermittent fevers, chills, loss of taste/smell, and rare nonproductive cough. Admitted for COVID-19 pneumonia, treated with remdesivir, Decadron and Actemra.  Continue to require O2 supplement Wife was also admitted for COVID-19 infection.  Subjective: Pt. Becomes dyspneic with mild exertion, just transferring to bed side commode requiring upto 15 L with HFNC.Now back to 10 L currently. Still feels very tired.No other complaints.  Assessment & Plan:   Principal Problem:   Acute hypoxemic respiratory failure due to COVID-19 [facility]) Active Problems:   Essential hypertension   Hyperlipidemia   Type 2 diabetes mellitus (HCC)  Acute hypoxic failure secondary to COVID-19 pneumonia.  Completed a course of remdesivir.  He was given Actemra X I. Continue to remain hypoxic with worsening oxygen requirement, now saturating on 10 L with HFNC. Lower extremity Doppler was positive for thrombus in right posterior tibial and peroneal vein.  D-dimer improving with CRP has been normalized.  CTA was negative for PE. -Switch Decadron with Solu-Medrol 40 mg twice daily. -Consult pulmonary for worsening hypoxia-appreciate their recommendations. -He was started on Mucomyst. -Continue Xarelto for DVT. -Continue zinc and vitamin C -Continue incentive spirometry. -Might have to go home with home oxygen. -PT/OT recommending SNF placement but patient and wife decided to go back home with home health services.  Leukocytosis.  Patient  has worsening leukocytosis with WBCs of 21.2 today.  Partly can be due to steroid.  Has mild hypothermia. -We will check chest x-ray, UA and procalcitonin to rule out any superadded bacterial infection. CXR- Stable UA -With leukocytes and bacteria- no urinary symptoms- Will check Urine Cx. PCT. <0.10>>0.16 -Check MRCA swab-negative -Hold Abx for now, might have to started if PCT continue to rise.  Hypertension.  Blood pressure was within goal. -Continue to monitor. -Continue Lopressor if heart rate within normal limit. -Can use hydralazine as needed for systolic above 160.  Type 2 diabetes mellitus.  Well-controlled with A1c of 6.1 And had some elevated blood sugar levels most likely secondary to steroid. -Continue to monitor and SSI.  Acute urinary retention.  Unclear etiology, most likely secondary to BPH. Has been resolved.  Patient is urinating without any difficulty. -Continue Flomax.  Objective: Vitals:   10/31/19 1020 10/31/19 1032 10/31/19 1041 10/31/19 1052  BP: (!) 120/57     Pulse: 75 69 66 69  Resp: (!) 25 15 (!) 21 (!) 22  Temp:      TempSrc:      SpO2: 91% 96% 96% 95%  Weight:      Height:        Intake/Output Summary (Last 24 hours) at 10/31/2019 1052 Last data filed at 10/31/2019 0710 Gross per 24 hour  Intake 240 ml  Output 675 ml  Net -435 ml   Filed Weights   10/20/19 1724 10/20/19 2331 10/22/19 0532  Weight: 105.2 kg 99.9 kg 98.7 kg    Examination:  General exam: Conically ill-appearing elderly man, appears calm and comfortable  Respiratory system: Clear bilaterally. Respiratory effort normal. Cardiovascular system: S1 & S2 heard, RRR. No JVD, murmurs, rubs, gallops or clicks. Gastrointestinal system: Soft, nontender, nondistended,  bowel sounds positive. Central nervous system: Alert and oriented. No focal neurological deficits.Symmetric 5 x 5 power. Extremities: No edema, no cyanosis, pulses intact and symmetrical. Skin: No rashes, lesions or  ulcers Psychiatry: Judgement and insight appear normal.    DVT prophylaxis: Xarelto Code Status: Full Family Communication: Son was updated on phone. Disposition Plan: Pending improvement, most likely next couple of days.  Might go home with home oxygen if fail to wean off.  He will go back home with home health services.  Consultants:   Pulmonary  Procedures:  Antimicrobials:   Data Reviewed: I have personally reviewed following labs and imaging studies  CBC: Recent Labs  Lab 10/25/19 0731 10/28/19 0600 10/30/19 0352  WBC 16.2* 16.7* 21.2*  NEUTROABS 14.7*  --   --   HGB 14.3 15.0 14.4  HCT 42.6 44.3 43.9  MCV 86.6 88.4 90.0  PLT 271 242 200   Basic Metabolic Panel: Recent Labs  Lab 10/25/19 0731 10/26/19 0841 10/28/19 0600 10/30/19 0352  NA 139 138 138 138  K 4.7 4.4 4.8 4.9  CL 108 107 107 107  CO2 22 21* 22 22  GLUCOSE 248* 201* 221* 240*  BUN 29* 29* 31* 36*  CREATININE 1.01 0.96 0.96 1.02  CALCIUM 7.6* 7.6* 7.8* 7.8*  MG 2.0  --   --   --   PHOS 3.5  --   --   --    GFR: Estimated Creatinine Clearance: 69.1 mL/min (by C-G formula based on SCr of 1.02 mg/dL). Liver Function Tests: Recent Labs  Lab 10/25/19 0731  AST 42*  ALT 40  ALKPHOS 58  BILITOT 0.6  PROT 5.4*  ALBUMIN 2.3*   No results for input(s): LIPASE, AMYLASE in the last 168 hours. No results for input(s): AMMONIA in the last 168 hours. Coagulation Profile: No results for input(s): INR, PROTIME in the last 168 hours. Cardiac Enzymes: No results for input(s): CKTOTAL, CKMB, CKMBINDEX, TROPONINI in the last 168 hours. BNP (last 3 results) No results for input(s): PROBNP in the last 8760 hours. HbA1C: No results for input(s): HGBA1C in the last 72 hours. CBG: Recent Labs  Lab 10/30/19 0737 10/30/19 1134 10/30/19 1649 10/30/19 2122 10/31/19 0725  GLUCAP 207* 221* 269* 298* 222*   Lipid Profile: No results for input(s): CHOL, HDL, LDLCALC, TRIG, CHOLHDL, LDLDIRECT in the last  72 hours. Thyroid Function Tests: No results for input(s): TSH, T4TOTAL, FREET4, T3FREE, THYROIDAB in the last 72 hours. Anemia Panel: No results for input(s): VITAMINB12, FOLATE, FERRITIN, TIBC, IRON, RETICCTPCT in the last 72 hours. Sepsis Labs: Recent Labs  Lab 10/26/19 0841 10/30/19 0352 10/31/19 0311  PROCALCITON <0.10 <0.10 0.16    Recent Results (from the past 240 hour(s))  MRSA PCR Screening     Status: None   Collection Time: 10/30/19 11:30 AM   Specimen: Nasal Mucosa; Nasopharyngeal  Result Value Ref Range Status   MRSA by PCR NEGATIVE NEGATIVE Final    Comment:        The GeneXpert MRSA Assay (FDA approved for NASAL specimens only), is one component of a comprehensive MRSA colonization surveillance program. It is not intended to diagnose MRSA infection nor to guide or monitor treatment for MRSA infections. Performed at [facility], [address] [place], [place] [address]      Radiology Studies: [facility] Chest [place] 1 View  Result Date: 10/30/2019 CLINICAL DATA:  Shortness of breath. EXAM: PORTABLE CHEST 1 VIEW COMPARISON:  October 28, 2019. FINDINGS: Stable cardiomediastinal silhouette.  No pneumothorax or pleural effusion is noted. Stable bilateral patchy airspace opacities are noted consistent with multifocal pneumonia. Bony thorax is unremarkable. IMPRESSION: Stable bilateral multifocal pneumonia. Electronically Signed   By: Marijo Conception M.D.   On: 10/30/2019 09:02    Scheduled Meds: . vitamin C  500 mg Oral Daily  . colchicine  0.6 mg Oral Daily  . dextromethorphan-guaiFENesin  1 tablet Oral BID  . famotidine  20 mg Oral BID  . insulin aspart  0-5 Units Subcutaneous QHS  . insulin aspart  0-9 Units Subcutaneous TID WC  . Ipratropium-Albuterol  1 puff Inhalation Q6H  . methylPREDNISolone (SOLU-MEDROL) injection  40 mg Intravenous Q12H  . rivaroxaban  15 mg Oral BID AC   Followed by  . [START ON 11/16/2019] rivaroxaban  20 mg Oral Q supper  .  sodium chloride flush  3 mL Intravenous Q12H  . tamsulosin  0.4 mg Oral Daily  . zinc sulfate  220 mg Oral Daily   Continuous Infusions: . sodium chloride Stopped (10/24/19 1900)  . sodium chloride       LOS: 11 days   Time spent: 45 minutes.  Lorella Nimrod, MD [facility]  If 7PM-7AM, please contact night-coverage 10/31/2019, 10:52 AM   This record has been created using Dragon voice recognition software. Errors have been sought and corrected,but may not always be located. Such creation errors do not reflect on the standard of care.

## 2019-10-31 NOTE — Care Management Important Message (Signed)
Important Message  Patient Details  Name: Walter Baxter MRN: [id number] Date of Birth: 09/17/1941   Medicare Important Message Given:  Yes     Trenton Founds, RN 10/31/2019, 3:23 PM

## 2019-10-31 NOTE — Progress Notes (Signed)
Physical Therapy Treatment Patient Details Name: Walter Baxter MRN: [id number] DOB: 12-Apr-1941 Today's Date: 10/31/2019    History of Present Illness Pt is 79 y/o M who presented to ER secondary to SOB, generalized weakness; admitted for management of acute respiratory failure related to COVID-19 PNA.    PT Comments    Pt is making gradual progress, continues to show increased dyspnea with exertion. Currently on 10L of HFNC at rest with O2 sats at 94%, however quickly desats to 84% with seated boosting up in bed. Pt too fatigued to ambulate this date reporting he just finished ambulating to [facility] and was very worn out. Able to performed seated there-ex and different breathing techniques including IS and flutter valve. Will continue to progress as able.   Follow Up Recommendations  Home health PT;Supervision/Assistance - 24 hour     Equipment Recommendations  Rolling walker with 5" wheels    Recommendations for Other Services       Precautions / Restrictions Precautions Precautions: Fall Restrictions Weight Bearing Restrictions: No    Mobility  Bed Mobility Overal bed mobility: Needs Assistance Bed Mobility: Supine to Sit;Sit to Supine     Supine to sit: Min guard Sit to supine: Min assist   General bed mobility comments: Safe technique with ability to use rails for assistance. Once seated at EOB, able to sit with upright posture. All mobility performed on 10L of O2. Dizziness noted with change of position, however dissapates easily.  Transfers Overall transfer level: Needs assistance Equipment used: Rolling walker (2 wheeled) Transfers: Sit to/from Stand Sit to Stand: Min assist         General transfer comment: able to stand at EOB, however fatigues quickly. Only able to laterally scoot at bedside. With scooting, sats decrease to 84%, however improve to mid 90s with resting.  Ambulation/Gait             General Gait Details: unable at this time secondary to  fatigue.    Stairs             Wheelchair Mobility    Modified Rankin (Stroke Patients Only)       Balance Overall balance assessment: Needs assistance Sitting-balance support: Feet supported;No upper extremity supported Sitting balance-Leahy Scale: Good     Standing balance support: Single extremity supported Standing balance-Leahy Scale: Fair                              Cognition Arousal/Alertness: Awake/alert Behavior During Therapy: WFL for tasks assessed/performed Overall Cognitive Status: Within Functional Limits for tasks assessed                                 General Comments: A&O however very tired      Exercises Other Exercises Other Exercises: supine/seated ther-ex performed on B LE including LAQ, alt marching, shoulder scaption, and over head presses. 10 reps with supervision. Multiple rest breaks needed. Also performed IS and flutter valve x 5 reps each.    General Comments        Pertinent Vitals/Pain Pain Assessment: No/denies pain    Home Living                      Prior Function            PT Goals (current goals can now be found in the care plan  section) Acute Rehab PT Goals Patient Stated Goal: to return home PT Goal Formulation: With patient Time For Goal Achievement: 11/07/19 Potential to Achieve Goals: Good Progress towards PT goals: Progressing toward goals    Frequency    Min 2X/week      PT Plan Current plan remains appropriate    Co-evaluation              AM-PAC PT "6 Clicks" Mobility   Outcome Measure  Help needed turning from your back to your side while in a flat bed without using bedrails?: None Help needed moving from lying on your back to sitting on the side of a flat bed without using bedrails?: None Help needed moving to and from a bed to a chair (including a wheelchair)?: A Little Help needed standing up from a chair using your arms (e.g., wheelchair or  bedside chair)?: A Little Help needed to walk in hospital room?: A Little Help needed climbing 3-5 steps with a railing? : A Little 6 Click Score: 20    End of Session Equipment Utilized During Treatment: Oxygen Activity Tolerance: Patient limited by fatigue Patient left: in bed;with call bell/phone within reach;with bed alarm set Nurse Communication: Mobility status PT Visit Diagnosis: Difficulty in walking, not elsewhere classified (R26.2);Muscle weakness (generalized) (M62.81)     Time: 4268-3419 PT Time Calculation (min) (ACUTE ONLY): 28 min  Charges:  $Therapeutic Exercise: 23-37 mins                     Greggory Stallion, [place], DPT [phone]    Demarus Latterell 10/31/2019, 12:36 PM

## 2019-10-31 NOTE — Plan of Care (Signed)
Pt's MEWS score is a 2.  Looked back at VS and he has periods of bradycardia when he sleeps. O2 sats  and respirations are stable

## 2019-11-01 LAB — CBC
HCT: 43.9 % (ref 39.0–52.0)
Hemoglobin: 14.4 g/dL (ref 13.0–17.0)
MCH: 29.5 pg (ref 26.0–34.0)
MCHC: 32.8 g/dL (ref 30.0–36.0)
MCV: 90 fL (ref 80.0–100.0)
Platelets: 152 10*3/uL (ref 150–400)
RBC: 4.88 MIL/uL (ref 4.22–5.81)
RDW: 14.1 % (ref 11.5–15.5)
WBC: 18 10*3/uL — ABNORMAL HIGH (ref 4.0–10.5)
nRBC: 0 % (ref 0.0–0.2)

## 2019-11-01 LAB — GLUCOSE, CAPILLARY
Glucose-Capillary: 209 mg/dL — ABNORMAL HIGH (ref 70–99)
Glucose-Capillary: 219 mg/dL — ABNORMAL HIGH (ref 70–99)
Glucose-Capillary: 174 mg/dL — ABNORMAL HIGH (ref 70–99)
Glucose-Capillary: 210 mg/dL — ABNORMAL HIGH (ref 70–99)

## 2019-11-01 LAB — BASIC METABOLIC PANEL
Anion gap: 8 (ref 5–15)
BUN: 29 mg/dL — ABNORMAL HIGH (ref 8–23)
CO2: 24 mmol/L (ref 22–32)
Calcium: 7.7 mg/dL — ABNORMAL LOW (ref 8.9–10.3)
Chloride: 104 mmol/L (ref 98–111)
Creatinine, Ser: 0.97 mg/dL (ref 0.61–1.24)
GFR calc Af Amer: >60 mL/min (ref >60)
GFR calc non Af Amer: >60 mL/min (ref >60)
Glucose, Bld: 228 mg/dL — ABNORMAL HIGH (ref 70–99)
Potassium: 5.1 mmol/L (ref 3.5–5.1)
Sodium: 136 mmol/L (ref 135–145)

## 2019-11-01 LAB — PROCALCITONIN: Procalcitonin: <0.1 ng/mL

## 2019-11-01 LAB — URINE CULTURE: Culture: >=100000 — AB

## 2019-11-01 LAB — FIBRIN DERIVATIVES D-DIMER (ARMC ONLY): Fibrin derivatives D-dimer (ARMC): 1705.53 ng/mL (FEU) — ABNORMAL HIGH (ref 0.00–499.00)

## 2019-11-01 MED ORDER — ENSURE MAX PROTEIN PO LIQD
11.0000 [oz_av] | Freq: Two times a day (BID) | ORAL | Status: DC
  Administered 2019-11-02 (×2): 11 [oz_av] via ORAL
  Administered 2019-11-03: 237 mL via ORAL
  Administered 2019-11-03 – 2019-11-05 (×4): 11 [oz_av] via ORAL
  Filled 2019-11-01: qty 330

## 2019-11-01 MED ORDER — INSULIN DETEMIR 100 UNIT/ML ~~LOC~~ SOLN
5.0000 [IU] | Freq: Every day | SUBCUTANEOUS | Status: DC
  Administered 2019-11-01: 21:00:00 5 [IU] via SUBCUTANEOUS
  Filled 2019-11-01: qty 1

## 2019-11-01 MED ORDER — FUROSEMIDE 10 MG/ML IJ SOLN
40.0000 mg | Freq: Every day | INTRAMUSCULAR | Status: DC
  Administered 2019-11-01 – 2019-11-02 (×2): 40 mg via INTRAVENOUS
  Filled 2019-11-01 (×2): qty 4

## 2019-11-01 MED ORDER — INSULIN ASPART 100 UNIT/ML ~~LOC~~ SOLN
3.0000 [IU] | Freq: Three times a day (TID) | SUBCUTANEOUS | Status: DC
  Administered 2019-11-01 – 2019-11-03 (×6): 3 [IU] via SUBCUTANEOUS
  Filled 2019-11-01 (×6): qty 1

## 2019-11-01 MED ORDER — SULFAMETHOXAZOLE-TRIMETHOPRIM 800-160 MG PO TABS
1.0000 | ORAL_TABLET | Freq: Two times a day (BID) | ORAL | Status: DC
  Administered 2019-11-01 – 2019-11-02 (×3): 1 via ORAL
  Filled 2019-11-01 (×5): qty 1

## 2019-11-01 MED ORDER — INSULIN ASPART 100 UNIT/ML ~~LOC~~ SOLN: 0.0000 [IU] | Freq: Four times a day (QID) | SUBCUTANEOUS | Status: DC

## 2019-11-01 NOTE — Progress Notes (Signed)
Initial Nutrition Assessment  RD working remotely.  DOCUMENTATION CODES:   Not applicable  INTERVENTION:  Recommend changing diet order from heart healthy to carbohydrate modified.  Provide Ensure Max Protein po BID, each supplement provides 150 kcal and 30 grams of protein.  NUTRITION DIAGNOSIS:   Increased nutrient needs related to catabolic illness(COVID-19) as evidenced by estimated needs.  GOAL:   Patient will meet greater than or equal to 90% of their needs  MONITOR:   PO intake, Supplement acceptance, Labs, Weight trends, I & O's  REASON FOR ASSESSMENT:   LOS    ASSESSMENT:   79 year old male with PMHx of DM type 2, HTN, HLD, chronic bronchitis admitted with acute hypoxic failure secondary to COVID-19 PNA, UTI.   Spoke with patient over the phone. He reports his appetite has been decreased but is starting to improve now. He is eating "most" of his meals now per his report. Average intake from data available in chart is 81%. Discussed increased calorie/protein needs due to catabolic nature of COVID-19. Patient is amenable to trying chocolate Ensure Max Protein.  Patient reports his UBW was around 230 lbs but on admission he was 99.9 kg (220.3 lbs). He was then 98.7 kg (217.5 lbs) on 1/30. No weight taken since then. Unsure when he lost the original 10 lbs so unable to trend significance.   Medications reviewed and include: vitamin C 500 mg daily, famotidine, Novolog 0-9 units TID, Novolog 0-5 units QHS, Novolog 3 units TID, Levemir 5 units QHS, Solu-Medrol 40 mg Q12hrs IV, Xarelto, Flomax, zinc sulfate 220 mg daily.  Labs reviewed: CBG 209-300, BUN 29.  NUTRITION - FOCUSED PHYSICAL EXAM:  Unable to complete at this time.  Diet Order:   Diet Order            Diet Heart Room service appropriate? Yes; Fluid consistency: Thin  Diet effective now             EDUCATION NEEDS:   No education needs have been identified at this time  Skin:  Skin Assessment:  Reviewed RN Assessment  Last BM:  10/31/2019 large type 4  Height:   Ht Readings from Last 1 Encounters:  10/20/19 5\' 8"  (1.727 m)   Weight:   Wt Readings from Last 1 Encounters:  10/22/19 98.7 kg   BMI:  Body mass index is 33.07 kg/m.  Estimated Nutritional Needs:   Kcal:  2100-2500  Protein:  105-120 grams  Fluid:  >2 L/day  10/24/19, MS, RD, LDN Pager number available on Amion

## 2019-11-01 NOTE — Progress Notes (Signed)
-   Pulmonary Medicine          Date: 11/01/2019,   MRN# [id number] Walter Baxter 12-Aug-1941     AdmissionWeight: 105.2 kg                 CurrentWeight: 98.7 kg      CHIEF COMPLAINT:   Increased O2 requirement with COVID 19 pneumonitis   HISTORY OF PRESENT ILLNESS   Patient is clinically improved.  His Mentor is down to 5L/min.  He has been in chair today half day and used IS very well.   PAST MEDICAL HISTORY   Past Medical History:  Diagnosis Date  . Allergic rhinitis   . Angio-edema   . Chronic bronchitis (HCC)   . Erectile dysfunction   . Essential hypertension   . Hyperlipidemia   . Obesity   . Type 2 diabetes mellitus (HCC)   [id number]   SURGICAL HISTORY   Past Surgical History:  Procedure Laterality Date  . ADENOIDECTOMY    . TONSILLECTOMY       FAMILY HISTORY   Family History  Problem Relation Age of Onset  . Lupus Sister      SOCIAL HISTORY   Social History   Tobacco Use  . Smoking status: Never Smoker  . Smokeless tobacco: Never Used  Substance Use Topics  . Alcohol use: Not Currently  . Drug use: Never     MEDICATIONS    Home Medication:    Current Medication:  Current Facility-Administered Medications:  .  0.9 %  sodium chloride infusion, , Intravenous, PRN, Rolly Salter, MD, Stopped at 10/24/19 1900 .  acetaminophen (TYLENOL) tablet 650 mg, 650 mg, Oral, Q6H PRN, Charlsie Quest, MD, 650 mg at 10/28/19 2009 .  ascorbic acid (VITAMIN C) tablet 500 mg, 500 mg, Oral, Daily, Darreld Mclean R, MD, 500 mg at 10/31/19 2129 .  cefTRIAXone (ROCEPHIN) 2 g in sodium chloride 0.9 % 100 mL IVPB, 2 g, Intravenous, Q24H, Arnetha Courser, MD, Stopped at 10/31/19 1728 .  colchicine tablet 0.6 mg, 0.6 mg, Oral, Daily, Jong Rickman, MD, 0.6 mg at 11/01/19 0857 .  dextromethorphan-guaiFENesin (MUCINEX DM) 30-600 MG per 12 hr tablet 1 tablet, 1 tablet, Oral, BID, Vida Rigger, MD, 1 tablet at 11/01/19 0800 .  famotidine (PEPCID)  tablet 20 mg, 20 mg, Oral, BID, Darreld Mclean R, MD, 20 mg at 11/01/19 0800 .  guaiFENesin-dextromethorphan (ROBITUSSIN DM) 100-10 MG/5ML syrup 10 mL, 10 mL, Oral, Q4H PRN, Darreld Mclean R, MD, 10 mL at 10/29/19 0831 .  insulin aspart (novoLOG) injection 0-5 Units, 0-5 Units, Subcutaneous, QHS, Rolly Salter, MD, 3 Units at 10/31/19 2131 .  insulin aspart (novoLOG) injection 0-9 Units, 0-9 Units, Subcutaneous, TID WC, Rolly Salter, MD, 3 Units at 11/01/19 [id number] .  Ipratropium-Albuterol (COMBIVENT) respimat 1 puff, 1 puff, Inhalation, Q6H, Charlsie Quest, MD, 1 puff at 11/01/19 0751 .  methylPREDNISolone sodium succinate (SOLU-MEDROL) 40 mg/mL injection 40 mg, 40 mg, Intravenous, Q12H, Karna Christmas, Primrose Oler, MD, 40 mg at 11/01/19 0051 .  ondansetron (ZOFRAN) tablet 4 mg, 4 mg, Oral, Q6H PRN **OR** ondansetron (ZOFRAN) injection 4 mg, 4 mg, Intravenous, Q6H PRN, Charlsie Quest, MD .  Rivaroxaban (XARELTO) tablet 15 mg, 15 mg, Oral, BID AC, 15 mg at 11/01/19 0754 **FOLLOWED BY** [START ON 11/16/2019] rivaroxaban (XARELTO) tablet 20 mg, 20 mg, Oral, Q supper, [name], Sumayya, MD .  sodium chloride 0.9 % bolus 500 mL, 500 mL, Intravenous, Once, Arnetha Courser, MD .  sodium chloride flush (NS) 0.9 % injection 3 mL, 3 mL, Intravenous, Q12H, Darreld Mclean R, MD, 3 mL at 11/01/19 0748 .  sulfamethoxazole-trimethoprim (BACTRIM) 400-80 MG per tablet 1 tablet, 1 tablet, Oral, Q12H, Vida Rigger, MD, 1 tablet at 11/01/19 0858 .  tamsulosin (FLOMAX) capsule 0.4 mg, 0.4 mg, Oral, Daily, Rolly Salter, MD, 0.4 mg at 11/01/19 0752 .  zinc sulfate capsule 220 mg, 220 mg, Oral, Daily, Darreld Mclean R, MD, 220 mg at 10/31/19 2129    ALLERGIES   Other     REVIEW OF SYSTEMS    Review of Systems:  Gen:  Denies  fever, sweats, chills weigh loss  HEENT: Denies blurred vision, double vision, ear pain, eye pain, hearing loss, nose bleeds, sore throat Cardiac:  No dizziness, chest pain or heaviness, chest  tightness,edema Resp:   Reports minimal cough,sputum porduction, but significant shortness of breath, however without wheezing, and no hemoptysis,  Gi: Denies swallowing difficulty, stomach pain, nausea or vomiting, diarrhea, constipation, bowel incontinence Gu:  Denies bladder incontinence, burning urine Ext:   Denies Joint pain, stiffness or swelling Skin: Denies  skin rash, easy bruising or bleeding or hives Endoc:  Denies polyuria, polydipsia , polyphagia or weight change Psych:   Denies depression, insomnia or hallucinations   Other:  All other systems negative   VS: BP 137/66 (BP Location: Right Arm)   Pulse 60   Temp 97.6 F (36.4 C) (Oral)   Resp (!) 21   Ht 5\' 8"  (1.727 m)   Wt 98.7 kg   SpO2 94%   BMI 33.07 kg/m      PHYSICAL EXAM    GENERAL:NAD, no fevers, chills, no weakness no fatigue HEAD: Normocephalic, atraumatic.  EYES: Pupils equal, round, reactive to light. Extraocular muscles intact. No scleral icterus.  MOUTH: Moist mucosal membrane. Dentition intact. No abscess noted.  EAR, NOSE, THROAT: Clear without exudates. No external lesions.  NECK: Supple. No thyromegaly. No nodules. No JVD.  PULMONARY: Diffuse coarse rhonchi bilaterally  No wheezes no crackles CARDIOVASCULAR: S1 and S2. Regular rate and rhythm. No murmurs, rubs, or gallops. No edema. Pedal pulses 2+ bilaterally.  GASTROINTESTINAL: Soft, nontender, nondistended. No masses. Positive bowel sounds. No hepatosplenomegaly.  MUSCULOSKELETAL: No swelling, clubbing, or edema. Range of motion full in all extremities.  NEUROLOGIC: Cranial nerves II through XII are intact. No gross focal neurological deficits. Sensation intact. Reflexes intact.  SKIN: No ulceration, lesions, rashes, or cyanosis. Skin warm and dry. Turgor intact.  PSYCHIATRIC: Mood, affect within normal limits. The patient is awake, alert and oriented x 3. Insight, judgment intact.       IMAGING    CT ANGIO CHEST PE W OR WO  CONTRAST  Result Date: 10/26/2019 CLINICAL DATA:  Shortness of breath, COVID positive EXAM: CT ANGIOGRAPHY CHEST WITH CONTRAST TECHNIQUE: Multidetector CT imaging of the chest was performed using the standard protocol during bolus administration of intravenous contrast. Multiplanar CT image reconstructions and MIPs were obtained to evaluate the vascular anatomy. CONTRAST:  92mL OMNIPAQUE IOHEXOL 350 MG/ML SOLN COMPARISON:  None. FINDINGS: Cardiovascular: Satisfactory opacification of the pulmonary arteries to the proximal segmental level. No evidence of pulmonary embolism. Normal heart size. No pericardial effusion. Coronary artery and thoracic aorta atherosclerotic calcification. Mediastinum/Nodes: No mediastinal, hilar, or axillary adenopathy. Thyroid is unremarkable. Esophagus appears normal. Lungs/Pleura: There is multifocal consolidation greatest at the periphery and lung bases. No pleural effusion or pneumothorax. Upper Abdomen: No acute abnormality. Musculoskeletal: No chest wall abnormality. No acute or significant  osseous findings. Review of the MIP images confirms the above findings. IMPRESSION: No evidence of acute pulmonary embolism. Bilateral consolidative opacities likely reflecting pneumonia (including COVID-19). Aortic Atherosclerosis (ICD10-I70.0). Electronically Signed   By: Macy Mis M.D.   On: 10/26/2019 17:03   US Venous Img Lower Bilateral (DVT)  Result Date: 10/26/2019 CLINICAL DATA:  Lower extremity edema for 1 day EXAM: BILATERAL LOWER EXTREMITY VENOUS DOPPLER ULTRASOUND TECHNIQUE: Gray-scale sonography with graded compression, as well as color Doppler and duplex ultrasound were performed to evaluate the lower extremity deep venous systems from the level of the common femoral vein and including the common femoral, femoral, profunda femoral, popliteal and calf veins including the posterior tibial, peroneal and gastrocnemius veins when visible. The superficial great saphenous vein was  also interrogated. Spectral Doppler was utilized to evaluate flow at rest and with distal augmentation maneuvers in the common femoral, femoral and popliteal veins. COMPARISON:  None. FINDINGS: RIGHT LOWER EXTREMITY Common Femoral Vein: No evidence of thrombus. Normal compressibility, respiratory phasicity and response to augmentation. Saphenofemoral Junction: No evidence of thrombus. Normal compressibility and flow on color Doppler imaging. Profunda Femoral Vein: No evidence of thrombus. Normal compressibility and flow on color Doppler imaging. Femoral Vein: No evidence of thrombus. Normal compressibility, respiratory phasicity and response to augmentation. Popliteal Vein: No evidence of thrombus. Normal compressibility, respiratory phasicity and response to augmentation. Calf Veins: Calf posterior tibial and peroneal veins demonstrate intraluminal thrombus appearing occlusive in the posterior tibial vein and nonocclusive in the peroneal vein. Vessels are noncompressible. Very low thrombus burden. LEFT LOWER EXTREMITY Common Femoral Vein: No evidence of thrombus. Normal compressibility, respiratory phasicity and response to augmentation. Saphenofemoral Junction: No evidence of thrombus. Normal compressibility and flow on color Doppler imaging. Profunda Femoral Vein: No evidence of thrombus. Normal compressibility and flow on color Doppler imaging. Femoral Vein: No evidence of thrombus. Normal compressibility, respiratory phasicity and response to augmentation. Popliteal Vein: No evidence of thrombus. Normal compressibility, respiratory phasicity and response to augmentation. Calf Veins: No evidence of thrombus. Normal compressibility and flow on color Doppler imaging. IMPRESSION: Positive exam for calf region right posterior tibial and peroneal vein DVT. Very low thrombus burden. No propagation into the popliteal or femoral veins. Negative for left lower extremity DVT. Electronically Signed   By: Jerilynn Mages.  Shick M.D.    On: 10/26/2019 11:54   DG Chest Port 1 View  Result Date: 10/30/2019 CLINICAL DATA:  Shortness of breath. EXAM: PORTABLE CHEST 1 VIEW COMPARISON:  October 28, 2019. FINDINGS: Stable cardiomediastinal silhouette. No pneumothorax or pleural effusion is noted. Stable bilateral patchy airspace opacities are noted consistent with multifocal pneumonia. Bony thorax is unremarkable. IMPRESSION: Stable bilateral multifocal pneumonia. Electronically Signed   By: Marijo Conception M.D.   On: 10/30/2019 09:02   DG Chest Port 1 View  Result Date: 10/28/2019 CLINICAL DATA:  79 year old male with pneumonia. EXAM: PORTABLE CHEST 1 VIEW COMPARISON:  Chest radiograph dated 10/24/2019 and CT dated 10/26/2019. FINDINGS: Bilateral mid to lower lung field subpleural and streaky densities appear similar or slightly improved since the prior radiograph. No large pleural effusion or pneumothorax. Stable cardiomediastinal silhouette. Atherosclerotic calcification of the aorta. No acute osseous pathology. IMPRESSION: Similar or slightly improved pulmonary opacities. Continued follow-up recommended. Electronically Signed   By: Anner Crete M.D.   On: 10/28/2019 20:37   DG Chest Port 1 View  Result Date: 10/24/2019 CLINICAL DATA:  Shortness of breath. EXAM: PORTABLE CHEST 1 VIEW COMPARISON:  October 20, 2019. FINDINGS: Stable cardiomediastinal silhouette.  Atherosclerosis of thoracic aorta is noted. No pneumothorax or pleural effusion is noted. Mildly increased bilateral patchy ill-defined airspace opacities are noted consistent with multifocal pneumonia. Bony thorax is unremarkable. IMPRESSION: Aortic atherosclerosis. Mildly increased bilateral patchy airspace opacities are noted consistent with multifocal pneumonia. Electronically Signed   By: Lupita Raider M.D.   On: 10/24/2019 08:08   DG Chest Port 1 View  Result Date: 10/20/2019 CLINICAL DATA:  Shortness of breath, COVID exposure. EXAM: PORTABLE CHEST 1 VIEW COMPARISON:   Chest radiograph dated 02/10/2011. FINDINGS: The heart appears enlarged. Vascular calcifications are seen in the aortic arch. Moderate airspace opacities are seen in the peripheral right mid lung and in the left lower lung. There is no pleural effusion or pneumothorax. The osseous structures are intact. IMPRESSION: Moderate airspace opacities in the peripheral right mid lung and left lower lung may reflect COVID-19 pneumonia. Electronically Signed   By: Romona Curls M.D.   On: 10/20/2019 18:11        ASSESSMENT/PLAN   Acute hypoxemic respiratory failure due to COVID 19 induced pneumonia - Currently increased from 5 to 7L/min [place] - patient is s/p Vecluri and Actemra -CT chest - reviewed by me as above with bilateral multifocal atypical pneumonia - Solumedrol - changing to 40 BID  - Adding colchicine 0.6mg  PO daily - Chest phyisotherapy - bed PT - please encourage patient to use IS and flutter valve - I worked closely with patient and he is now able to inhale Vt at  - patient is Net negative 4.5 L  And is borderline hypotensive - will  diuresis - Unable to add any nebulized medication due to COVID precautions -PT/OT already ordered -will continue to follow along with you  - repeat CXR reviewed.    DVT   - patient already on anticoagulation   - no signs of bleeding       Thank you for allowing me to participate in the care of this patient.     Patient/Family are satisfied with care plan and all questions have been answered.  This document was prepared using Dragon voice recognition software and may include unintentional dictation errors.     Vida Rigger, M.D.  Division of Pulmonary & Critical Care Medicine  [facility]

## 2019-11-01 NOTE — Progress Notes (Addendum)
PROGRESS NOTE    Walter Baxter  GYJ:[id number] DOB: [birth year] DOA: 10/20/2019 PCP: Lupita Raider, MD   Brief Narrative:  Walter Baxter a 79 y.o.malewith medical history significant fordiet-controlled diabetes, hypertension, and hyperlipidemia who presents to the ED for evaluation of shortness of breath.Patient reports new sudden onset of significant generalized weakness, fatigue, and shortness of breath beginning the night before admission. He has had associated intermittent fevers, chills, loss of taste/smell, and rare nonproductive cough. Admitted for COVID-19 pneumonia, treated with remdesivir, Decadron and Actemra.  Continue to require O2 supplement Wife was also admitted for COVID-19 infection.  Subjective: Pt. Was sitting in chair. Able to speak full sentences. Stating his SOB seems improving as he started recovering little easily after mild exertion.  Assessment & Plan:   Principal Problem:   Acute hypoxemic respiratory failure due to COVID-19 [facility]) Active Problems:   Essential hypertension   Hyperlipidemia   Type 2 diabetes mellitus (HCC)  Acute hypoxic failure secondary to COVID-19 pneumonia.  Completed a course of remdesivir.  He was given Actemra X I. Lower extremity Doppler was positive for thrombus in right posterior tibial and peroneal vein.  D-dimer improving with CRP has been normalized.  CTA was negative for PE. Seems improving, now saturating in low 90s on 6 L at rest, increase to 9 with any activity. -Switched Decadron with Solu-Medrol 40 mg twice daily. -Pulmonary started him on Bactrim for PCP prophylaxis. -He was started on Mucomyst. -Continue Xarelto for DVT. -Continue zinc and vitamin C -Continue incentive spirometry. -Might have to go home with home oxygen. -PT/OT recommending SNF placement but patient and wife decided to go back home with home health services. -Consult pulmonary for worsening hypoxia-appreciate their  recommendations.  Leukocytosis/UTI.  Patient has leukocytosis with WBCs of 18 today, improvement from prior.  Partly can be due to steroid.   CXR- Stable UA -With leukocytes and bacteria- no urinary symptoms. Urine culture is growing more than 100,000 colonies of E. Coli-with good sensitivity. -Discontinue ceftriaxone. -Start him on Bactrim. PCT. <0.10>>0.16 -Check MRCA swab-negative  Hypertension.  Blood pressure was within goal. -Continue to monitor. -Continue Lopressor if heart rate within normal limit. -Can use hydralazine as needed for systolic above 160.  Type 2 diabetes mellitus.  Well-controlled with A1c of 6.1 And had some elevated blood sugar levels most likely secondary to steroid. CBG mostly in 200s now. -Add Levemir 5 units at bedtime. -NovoLog 3 units with meals. -Continue to monitor and SSI.  Acute urinary retention.  Unclear etiology, most likely secondary to BPH. Has been resolved.  Patient is urinating without any difficulty. -Continue Flomax.  Objective: Vitals:   10/31/19 1405 11/01/19 0706 11/01/19 0707 11/01/19 0738  BP:   137/66   Pulse: 81 69 67   Resp: (!) 22 19 19    Temp:   97.6 F (36.4 C)   TempSrc:   Oral   SpO2: 95% 97% 93% 92%  Weight:      Height:        Intake/Output Summary (Last 24 hours) at 11/01/2019 0742 Last data filed at 11/01/2019 12/30/2019 Gross per 24 hour  Intake 100 ml  Output 625 ml  Net -525 ml   Filed Weights   10/20/19 1724 10/20/19 2331 10/22/19 0532  Weight: 105.2 kg 99.9 kg 98.7 kg    Examination:  General exam: Conically ill-appearing elderly man, appears calm and comfortable,sitting in chair.  Respiratory system: Clear bilaterally. Respiratory effort normal. Cardiovascular system: S1 & S2 heard, RRR. No  JVD, murmurs, rubs, gallops or clicks. Gastrointestinal system: Soft, nontender, nondistended, bowel sounds positive. Central nervous system: Alert and oriented. No focal neurological deficits.Symmetric 5 x 5  power. Extremities: No edema, no cyanosis, pulses intact and symmetrical. Skin: No rashes, lesions or ulcers Psychiatry: Judgement and insight appear normal.    DVT prophylaxis: Xarelto Code Status: Full Family Communication: No family at bed site. Disposition Plan: Pending improvement, most likely next couple of days.  Might go home with home oxygen if fail to wean off.  He will go back home with home health services.  Consultants:   Pulmonary  Procedures:  Antimicrobials:   Data Reviewed: I have personally reviewed following labs and imaging studies  CBC: Recent Labs  Lab 10/28/19 0600 10/30/19 0352 11/01/19 0548  WBC 16.7* 21.2* 18.0*  HGB 15.0 14.4 14.4  HCT 44.3 43.9 43.9  MCV 88.4 90.0 90.0  PLT 242 200 272   Basic Metabolic Panel: Recent Labs  Lab 10/26/19 0841 10/28/19 0600 10/30/19 0352 11/01/19 0548  NA 138 138 138 136  K 4.4 4.8 4.9 5.1  CL 107 107 107 104  CO2 21* 22 22 24   GLUCOSE 201* 221* 240* 228*  BUN 29* 31* 36* 29*  CREATININE 0.96 0.96 1.02 0.97  CALCIUM 7.6* 7.8* 7.8* 7.7*   GFR: Estimated Creatinine Clearance: 72.6 mL/min (by C-G formula based on SCr of 0.97 mg/dL). Liver Function Tests: No results for input(s): AST, ALT, ALKPHOS, BILITOT, PROT, ALBUMIN in the last 168 hours. No results for input(s): LIPASE, AMYLASE in the last 168 hours. No results for input(s): AMMONIA in the last 168 hours. Coagulation Profile: No results for input(s): INR, PROTIME in the last 168 hours. Cardiac Enzymes: No results for input(s): CKTOTAL, CKMB, CKMBINDEX, TROPONINI in the last 168 hours. BNP (last 3 results) No results for input(s): PROBNP in the last 8760 hours. HbA1C: No results for input(s): HGBA1C in the last 72 hours. CBG: Recent Labs  Lab 10/31/19 0725 10/31/19 1119 10/31/19 1628 10/31/19 2122 11/01/19 0728  GLUCAP 222* 275* 290* 300* 209*   Lipid Profile: No results for input(s): CHOL, HDL, LDLCALC, TRIG, CHOLHDL, LDLDIRECT in the  last 72 hours. Thyroid Function Tests: No results for input(s): TSH, T4TOTAL, FREET4, T3FREE, THYROIDAB in the last 72 hours. Anemia Panel: No results for input(s): VITAMINB12, FOLATE, FERRITIN, TIBC, IRON, RETICCTPCT in the last 72 hours. Sepsis Labs: Recent Labs  Lab 10/26/19 0841 10/30/19 0352 10/31/19 0311  PROCALCITON <0.10 <0.10 0.16    Recent Results (from the past 240 hour(s))  Urine Culture     Status: Abnormal (Preliminary result)   Collection Time: 10/30/19 11:14 AM   Specimen: Urine, Random  Result Value Ref Range Status   Specimen Description   Final    URINE, RANDOM Performed at [facility], [address], [place], [place] [address]    Special Requests   Final    NONE Performed at [facility], [address] [place], [place] [address]    Culture (A)  Final    >=100,000 COLONIES/mL ESCHERICHIA COLI SUSCEPTIBILITIES TO FOLLOW Performed at [facility], [place] [address], [place], [place] [address]    Report Status PENDING  Incomplete  MRSA PCR Screening     Status: None   Collection Time: 10/30/19 11:30 AM   Specimen: Nasal Mucosa; Nasopharyngeal  Result Value Ref Range Status   MRSA by PCR NEGATIVE NEGATIVE Final    Comment:        The GeneXpert MRSA  Assay (FDA approved for NASAL specimens only), is one component of a comprehensive MRSA colonization surveillance program. It is not intended to diagnose MRSA infection nor to guide or monitor treatment for MRSA infections. Performed at [facility], [address], [place], [place] [address]      Radiology Studies: [facility] Chest Walker 1 View  Result Date: 10/30/2019 CLINICAL DATA:  Shortness of breath. EXAM: PORTABLE CHEST 1 VIEW COMPARISON:  October 28, 2019. FINDINGS: Stable cardiomediastinal silhouette. No pneumothorax or pleural effusion is noted. Stable bilateral patchy airspace opacities are noted consistent with multifocal pneumonia. Bony thorax is unremarkable.  IMPRESSION: Stable bilateral multifocal pneumonia. Electronically Signed   By: Lupita Raider M.D.   On: 10/30/2019 09:02    Scheduled Meds: . vitamin C  500 mg Oral Daily  . colchicine  0.6 mg Oral Daily  . dextromethorphan-guaiFENesin  1 tablet Oral BID  . famotidine  20 mg Oral BID  . insulin aspart  0-5 Units Subcutaneous QHS  . insulin aspart  0-9 Units Subcutaneous TID WC  . Ipratropium-Albuterol  1 puff Inhalation Q6H  . methylPREDNISolone (SOLU-MEDROL) injection  40 mg Intravenous Q12H  . rivaroxaban  15 mg Oral BID AC   Followed by  . [START ON 11/16/2019] rivaroxaban  20 mg Oral Q supper  . sodium chloride flush  3 mL Intravenous Q12H  . sulfamethoxazole-trimethoprim  1 tablet Oral Q12H  . tamsulosin  0.4 mg Oral Daily  . zinc sulfate  220 mg Oral Daily   Continuous Infusions: . sodium chloride Stopped (10/24/19 1900)  . cefTRIAXone (ROCEPHIN)  IV Stopped (10/31/19 1728)  . sodium chloride       LOS: 12 days   Time spent: 45 minutes.  Arnetha Courser, MD [facility]  If 7PM-7AM, please contact night-coverage 11/01/2019, 7:42 AM   This record has been created using Dragon voice recognition software. Errors have been sought and corrected,but may not always be located. Such creation errors do not reflect on the standard of care.

## 2019-11-01 NOTE — Progress Notes (Signed)
Pharmacy Antibiotic Note  Walter Baxter is a 79 y.o. male admitted on 10/20/2019 with Covid pneumonitis/PNA?/ now ?UTI.  Pharmacy has been consulted for Septra dosing.  -Patient on Septra SS PO q12h for PCP prophylaxis by pulmonary, and CTX for poss UTI per attending  Plan: - Urine Cx= E.coli, resistant to Ampicillin, Unasyn and Cipro. Per message with MD, will discontinue CTX and continue with Septra. Will adjust Septra to DS PO q12h for now.    Height: 5\' 8"  (172.7 cm) Weight: 217 lb 8 oz (98.7 kg) IBW/kg (Calculated) : 68.4  Temp (24hrs), Avg:97.6 F (36.4 C), Min:97.6 F (36.4 C), Max:97.6 F (36.4 C)  Recent Labs  Lab 10/26/19 0841 10/28/19 0600 10/30/19 0352 11/01/19 0548  WBC  --  16.7* 21.2* 18.0*  CREATININE 0.96 0.96 1.02 0.97    Estimated Creatinine Clearance: 72.6 mL/min (by C-G formula based on SCr of 0.97 mg/dL).    Allergies  Allergen Reactions  . Other Rash    Magic mouth wash     Antimicrobials this admission: CTX 2/8 >> 2/9 Bactrim 2/8 >>  Dose adjustments this admission:    Microbiology results:   BCx:   2/7 UCx: e/coli    Sputum:  2/7 MRSA PCR: neg  Thank you for allowing pharmacy to be a part of this patient's care.  Yehudit Fulginiti A 11/01/2019 1:54 PM

## 2019-11-01 NOTE — Progress Notes (Signed)
Inpatient Diabetes Program Recommendations  AACE/ADA: New Consensus Statement on Inpatient Glycemic Control   Target Ranges:  Prepandial:   less than 140 mg/dL      Peak postprandial:   less than 180 mg/dL (1-2 hours)      Critically ill patients:  140 - 180 mg/dL   Results for HARM, Walter Baxter (MRN [id number]) as of 11/01/2019 10:48  Ref. Range 10/31/2019 07:25 10/31/2019 11:19 10/31/2019 16:28 10/31/2019 21:22 11/01/2019 07:28  Glucose-Capillary Latest Ref Range: 70 - 99 mg/dL 597 (H) 471 (H) 855 (H) 300 (H) 209 (H)  Results for Walter Baxter, Walter Baxter (MRN [id number]) as of 11/01/2019 10:48  Ref. Range 10/20/2019 22:10  Hemoglobin A1C Latest Ref Range: 4.8 - 5.6 % 6.1 (H)   Review of Glycemic Control  Diabetes history: DM2 Outpatient Diabetes medications: None; diet controlled Current orders for Inpatient glycemic control: Novolog 0-9 units TID with meals, Novolog 0-5 units QHS; Solumedrol 40 mg Q12H  Inpatient Diabetes Program Recommendations:   Insulin-Basal: If steroids are continued, please consider ordering Levemir 5 units BID.  Insulin-Meal Coverage: If steroids are continued, please consider ordering Novolog 3 units TID with meals for meal coverage if patient eats at least 50% of meals.   Thanks, Orlando Penner, RN, MSN, CDE Diabetes Coordinator Inpatient Diabetes Program [phone] (Team Pager from 8am to 5pm)

## 2019-11-01 NOTE — Progress Notes (Signed)
Occupational Therapy Treatment Patient Details Name: Walter Baxter MRN: [id number] DOB: Jan 29, 1941 Today's Date: 11/01/2019    History of present illness Pt is 79 y/o M who presented to ER secondary to SOB, generalized weakness; admitted for management of acute respiratory failure related to COVID-19 PNA.   OT comments  Pt seen for OT tx this date to f/u re: EC techniques for ADLs/ADL mobility as well as general safety and technique to reduce fall risk and increase tolerance. Pt tolerates EOB sitting ~15 minutes to complete UB ADLs with setup to MIN A and LB ADLs with MOD to MAX A using sit/lateral lean technique with rest breaks d/t decreased O2 sats with exertion/dynamic reaching in sitting (seen precaution section for O2 details). Pt tolerates SPS with RW from EOB to recliner with MIN A with MIN verbal cues for safe hand placement and to reach back to control descent. Pt still with decreased level of independence versus baseline and increased need for external assistance with ADLs/ADL mobility. Anticipate that SNF is still most prudent d/c disposition.    Follow Up Recommendations  SNF    Equipment Recommendations  Other (comment)(defer to next level of care.)    Recommendations for Other Services      Precautions / Restrictions Precautions Precautions: Fall Restrictions Weight Bearing Restrictions: No Other Position/Activity Restrictions: Watch spO2, increased O2 needs this date, requiring 9Lnc (sitting)-15Lnc(transferring) to maintain sats >88%. Pt requires 2-3 mins recovery time and PLB cues to regain sats >90%. Lowest point with activity 86% on 15Lnc, highest point with rest: 91% while sitting on 9Lnc       Mobility Bed Mobility Overal bed mobility: Needs Assistance Bed Mobility: Supine to Sit     Supine to sit: Min guard;HOB elevated        Transfers Overall transfer level: Needs assistance Equipment used: Rolling walker (2 wheeled) Transfers: Stand Pivot  Transfers   Stand pivot transfers: Min guard       General transfer comment: Faitgues easily, requires MOD verbal cues to reach back to control descent.    Balance Overall balance assessment: Needs assistance Sitting-balance support: Feet supported;No upper extremity supported Sitting balance-Leahy Scale: Good     Standing balance support: Bilateral upper extremity supported Standing balance-Leahy Scale: Fair Standing balance comment: unsteady primarily r/t fatigue/O2 needs                           ADL either performed or assessed with clinical judgement   ADL       Grooming: Wash/dry face;Sitting Grooming Details (indicate cue type and reason): Tolerates 4-5 mins seated activity with ~1 min recovery time while seated EOB         Upper Body Dressing : Minimal assistance;Sitting Upper Body Dressing Details (indicate cue type and reason): assist with R UE d/t leads Lower Body Dressing: Moderate assistance;Sitting/lateral leans Lower Body Dressing Details (indicate cue type and reason): seated threading socks, requires MOD A d/t fatigue with dyanmic reaching/exertional seated task                     Vision Patient Visual Report: No change from baseline     Perception     Praxis      Cognition Arousal/Alertness: Awake/alert Behavior During Therapy: WFL for tasks assessed/performed Overall Cognitive Status: Within Functional Limits for tasks assessed  General Comments: A&O        Exercises Other Exercises Other Exercises: OT facilitates cues/education re: PLB technique and importance of breathing through nose to recieve O2 through cannula. Pt demos good understanding while seated, but demos decreased application of techniuqe in standing/on excertion whe most useful. Other Exercises: OT facilitats education with tactile cueing re: importance of posture to increase surface area for adequate breath  volume.   Shoulder Instructions       General Comments      Pertinent Vitals/ Pain       Pain Assessment: No/denies pain  Home Living                                          Prior Functioning/Environment              Frequency  Min 2X/week        Progress Toward Goals  OT Goals(current goals can now be found in the care plan section)  Progress towards OT goals: OT to reassess next treatment(increased O2 needs this session)  Acute Rehab OT Goals Patient Stated Goal: to return home OT Goal Formulation: With patient Time For Goal Achievement: 11/08/19 Potential to Achieve Goals: Good  Plan Discharge plan remains appropriate;Frequency remains appropriate    Co-evaluation                 AM-PAC OT "6 Clicks" Daily Activity     Outcome Measure   Help from another person eating meals?: None Help from another person taking care of personal grooming?: A Little Help from another person toileting, which includes using toliet, bedpan, or urinal?: A Little Help from another person bathing (including washing, rinsing, drying)?: A Lot Help from another person to put on and taking off regular upper body clothing?: A Little Help from another person to put on and taking off regular lower body clothing?: A Lot 6 Click Score: 17    End of Session Equipment Utilized During Treatment: Rolling walker;Oxygen  OT Visit Diagnosis: Muscle weakness (generalized) (M62.81);Other abnormalities of gait and mobility (R26.89)   Activity Tolerance Patient tolerated treatment well;Other (comment)(somewhat limited d/t increased O2 needs this date. But tolerates well overall, maintains >88% when O2 at 15L HFNC)   Patient Left in chair;with call bell/phone within reach;with chair alarm set   Nurse Communication Other (comment)(notified RN that pt left in chair with chair alarm with O2 from 7L HFNC at start of session, required 15L with exertion, and sitting in chair  on 9Lnc.)        Time: 1751-0258 OT Time Calculation (min): 46 min  Charges: OT Treatments $Self Care/Home Management : 8-22 mins $Therapeutic Activity: 23-37 mins  Rejeana Brock, MS, OTR/L ascom [phone] 11/01/19, 2:24 PM

## 2019-11-02 LAB — BASIC METABOLIC PANEL
Anion gap: 8 (ref 5–15)
BUN: 39 mg/dL — ABNORMAL HIGH (ref 8–23)
CO2: 28 mmol/L (ref 22–32)
Calcium: 7.9 mg/dL — ABNORMAL LOW (ref 8.9–10.3)
Chloride: 102 mmol/L (ref 98–111)
Creatinine, Ser: 1.16 mg/dL (ref 0.61–1.24)
GFR calc Af Amer: >60 mL/min (ref >60)
GFR calc non Af Amer: >60 mL/min (ref >60)
Glucose, Bld: 210 mg/dL — ABNORMAL HIGH (ref 70–99)
Potassium: 4.9 mmol/L (ref 3.5–5.1)
Sodium: 138 mmol/L (ref 135–145)

## 2019-11-02 LAB — GLUCOSE, CAPILLARY
Glucose-Capillary: 224 mg/dL — ABNORMAL HIGH (ref 70–99)
Glucose-Capillary: 202 mg/dL — ABNORMAL HIGH (ref 70–99)
Glucose-Capillary: 218 mg/dL — ABNORMAL HIGH (ref 70–99)
Glucose-Capillary: 277 mg/dL — ABNORMAL HIGH (ref 70–99)

## 2019-11-02 LAB — CBC
HCT: 44.6 % (ref 39.0–52.0)
Hemoglobin: 14.9 g/dL (ref 13.0–17.0)
MCH: 29.6 pg (ref 26.0–34.0)
MCHC: 33.4 g/dL (ref 30.0–36.0)
MCV: 88.7 fL (ref 80.0–100.0)
Platelets: 146 10*3/uL — ABNORMAL LOW (ref 150–400)
RBC: 5.03 MIL/uL (ref 4.22–5.81)
RDW: 14.4 % (ref 11.5–15.5)
WBC: 16.1 10*3/uL — ABNORMAL HIGH (ref 4.0–10.5)
nRBC: 0 % (ref 0.0–0.2)

## 2019-11-02 MED ORDER — INSULIN DETEMIR 100 UNIT/ML ~~LOC~~ SOLN
5.0000 [IU] | Freq: Two times a day (BID) | SUBCUTANEOUS | Status: DC
  Administered 2019-11-02 – 2019-11-03 (×3): 5 [IU] via SUBCUTANEOUS
  Filled 2019-11-02 (×3): qty 1

## 2019-11-02 MED ORDER — SUCRALFATE 1 GM/10ML PO SUSP
1.0000 g | Freq: Three times a day (TID) | ORAL | Status: DC
  Administered 2019-11-02 – 2019-11-05 (×12): 1 g via ORAL
  Filled 2019-11-02 (×14): qty 10

## 2019-11-02 NOTE — Progress Notes (Signed)
Inpatient Diabetes Program Recommendations  AACE/ADA: New Consensus Statement on Inpatient Glycemic Control   Target Ranges:  Prepandial:   less than 140 mg/dL      Peak postprandial:   less than 180 mg/dL (1-2 hours)      Critically ill patients:  140 - 180 mg/dL   Results for GARDNER, SERVANTES (MRN [id number]) as of 11/02/2019 12:03  Ref. Range 11/01/2019 07:28 11/01/2019 11:35 11/01/2019 16:25 11/01/2019 21:12 11/02/2019 07:45 11/02/2019 11:43  Glucose-Capillary Latest Ref Range: 70 - 99 mg/dL 255 (H) 001 (H) 642 (H) 210 (H) 224 (H) 202 (H)   Review of Glycemic Control  Diabetes history: DM2 Outpatient Diabetes medications: None; diet controlled Current orders for Inpatient glycemic control:Levemir 5 units QHS, Novolog 0-9 units TID with meals, Novolog 3 units TID with meals; Solumedrol 40 mg Q12H  Inpatient Diabetes Program Recommendations:   Insulin-Basal: If steroids are continued, please consider increasing Levemir to 5 units BID (to start now).  Thanks, Orlando Penner, RN, MSN, CDE Diabetes Coordinator Inpatient Diabetes Program [phone] (Team Pager from 8am to 5pm)

## 2019-11-02 NOTE — Progress Notes (Signed)
-   Pulmonary Medicine          Date: 11/02/2019,   MRN# [id number] Walter Baxter 10/04/1940     AdmissionWeight: 105.2 kg                 CurrentWeight: 98.7 kg      CHIEF COMPLAINT:   Increased O2 requirement with COVID 19 pneumonitis   HISTORY OF PRESENT ILLNESS   Patient is clinically improved.  He diuresed well yesterday , appx 2L.  He feels similar to before but states he had abdominal pain with meal, I will start carafate today.    He is still at appx 550cc on incentive spirometry.     PAST MEDICAL HISTORY   Past Medical History:  Diagnosis Date  . Allergic rhinitis   . Angio-edema   . Chronic bronchitis (HCC)   . Erectile dysfunction   . Essential hypertension   . Hyperlipidemia   . Obesity   . Type 2 diabetes mellitus (HCC)   [id number]   SURGICAL HISTORY   Past Surgical History:  Procedure Laterality Date  . ADENOIDECTOMY    . TONSILLECTOMY       FAMILY HISTORY   Family History  Problem Relation Age of Onset  . Lupus Sister      SOCIAL HISTORY   Social History   Tobacco Use  . Smoking status: Never Smoker  . Smokeless tobacco: Never Used  Substance Use Topics  . Alcohol use: Not Currently  . Drug use: Never     MEDICATIONS    Home Medication:    Current Medication:  Current Facility-Administered Medications:  .  0.9 %  sodium chloride infusion, , Intravenous, PRN, Rolly Salter, MD, Stopped at 10/24/19 1900 .  acetaminophen (TYLENOL) tablet 650 mg, 650 mg, Oral, Q6H PRN, Charlsie Quest, MD, 650 mg at 11/01/19 1812 .  ascorbic acid (VITAMIN C) tablet 500 mg, 500 mg, Oral, Daily, Darreld Mclean R, MD, 500 mg at 11/01/19 2123 .  colchicine tablet 0.6 mg, 0.6 mg, Oral, Daily, Kamber Vignola, MD, 0.6 mg at 11/01/19 0857 .  dextromethorphan-guaiFENesin (MUCINEX DM) 30-600 MG per 12 hr tablet 1 tablet, 1 tablet, Oral, BID, Vida Rigger, MD, 1 tablet at 11/02/19 0831 .  famotidine (PEPCID) tablet 20 mg, 20 mg,  Oral, BID, Allena Katz, Vishal R, MD, 20 mg at 11/02/19 0830 .  furosemide (LASIX) injection 40 mg, 40 mg, Intravenous, Daily, Vida Rigger, MD, 40 mg at 11/02/19 0831 .  guaiFENesin-dextromethorphan (ROBITUSSIN DM) 100-10 MG/5ML syrup 10 mL, 10 mL, Oral, Q4H PRN, Darreld Mclean R, MD, 10 mL at 10/29/19 0831 .  insulin aspart (novoLOG) injection 0-9 Units, 0-9 Units, Subcutaneous, TID WC, Rolly Salter, MD, 3 Units at 11/02/19 [phone] .  insulin aspart (novoLOG) injection 3 Units, 3 Units, Subcutaneous, TID WC, Arnetha Courser, MD, 3 Units at 11/02/19 [phone]  insulin detemir (LEVEMIR) injection 5 Units, 5 Units, Subcutaneous, QHS, Arnetha Courser, MD, 5 Units at 11/01/19 2123 .  Ipratropium-Albuterol (COMBIVENT) respimat 1 puff, 1 puff, Inhalation, Q6H, Darreld Mclean R, MD, 1 puff at 11/02/19 0830 .  methylPREDNISolone sodium succinate (SOLU-MEDROL) 40 mg/mL injection 40 mg, 40 mg, Intravenous, Q12H, Karna Christmas, Blessyn Sommerville, MD, 40 mg at 11/02/19 0010 .  ondansetron (ZOFRAN) tablet 4 mg, 4 mg, Oral, Q6H PRN **OR** ondansetron (ZOFRAN) injection 4 mg, 4 mg, Intravenous, Q6H PRN, Allena Katz, Vishal R, MD .  protein supplement (ENSURE MAX) liquid, 11 oz, Oral, BID BM, Arnetha Courser, MD .  Rivaroxaban (  XARELTO) tablet 15 mg, 15 mg, Oral, BID AC, 15 mg at 11/01/19 1650 **FOLLOWED BY** [START ON 11/16/2019] rivaroxaban (XARELTO) tablet 20 mg, 20 mg, Oral, Q supper, [name], Sumayya, MD .  sodium chloride 0.9 % bolus 500 mL, 500 mL, Intravenous, Once, [name], Sumayya, MD .  sodium chloride flush (NS) 0.9 % injection 3 mL, 3 mL, Intravenous, Q12H, Darreld Mclean R, MD, 3 mL at 11/02/19 0835 .  sulfamethoxazole-trimethoprim (BACTRIM DS) 800-160 MG per tablet 1 tablet, 1 tablet, Oral, Q12H, Arnetha Courser, MD, 1 tablet at 11/02/19 0830 .  tamsulosin (FLOMAX) capsule 0.4 mg, 0.4 mg, Oral, Daily, Rolly Salter, MD, 0.4 mg at 11/02/19 0831 .  zinc sulfate capsule 220 mg, 220 mg, Oral, Daily, Darreld Mclean R, MD, 220 mg at 11/01/19  2123    ALLERGIES   Other     REVIEW OF SYSTEMS    Review of Systems:  Gen:  Denies  fever, sweats, chills weigh loss  HEENT: Denies blurred vision, double vision, ear pain, eye pain, hearing loss, nose bleeds, sore throat Cardiac:  No dizziness, chest pain or heaviness, chest tightness,edema Resp:   Reports minimal cough,sputum porduction, but significant shortness of breath, however without wheezing, and no hemoptysis,  Gi: Denies swallowing difficulty, stomach pain, nausea or vomiting, diarrhea, constipation, bowel incontinence Gu:  Denies bladder incontinence, burning urine Ext:   Denies Joint pain, stiffness or swelling Skin: Denies  skin rash, easy bruising or bleeding or hives Endoc:  Denies polyuria, polydipsia , polyphagia or weight change Psych:   Denies depression, insomnia or hallucinations   Other:  All other systems negative   VS: BP 127/60   Pulse 66   Temp (!) 97.5 F (36.4 C)   Resp 20   Ht 5\' 8"  (1.727 m)   Wt 98.7 kg   SpO2 96%   BMI 33.07 kg/m      PHYSICAL EXAM    GENERAL:NAD, no fevers, chills, no weakness no fatigue HEAD: Normocephalic, atraumatic.  EYES: Pupils equal, round, reactive to light. Extraocular muscles intact. No scleral icterus.  MOUTH: Moist mucosal membrane. Dentition intact. No abscess noted.  EAR, NOSE, THROAT: Clear without exudates. No external lesions.  NECK: Supple. No thyromegaly. No nodules. No JVD.  PULMONARY: Diffuse coarse rhonchi bilaterally  No wheezes no crackles CARDIOVASCULAR: S1 and S2. Regular rate and rhythm. No murmurs, rubs, or gallops. No edema. Pedal pulses 2+ bilaterally.  GASTROINTESTINAL: Soft, nontender, nondistended. No masses. Positive bowel sounds. No hepatosplenomegaly.  MUSCULOSKELETAL: No swelling, clubbing, or edema. Range of motion full in all extremities.  NEUROLOGIC: Cranial nerves II through XII are intact. No gross focal neurological deficits. Sensation intact. Reflexes intact.  SKIN:  No ulceration, lesions, rashes, or cyanosis. Skin warm and dry. Turgor intact.  PSYCHIATRIC: Mood, affect within normal limits. The patient is awake, alert and oriented x 3. Insight, judgment intact.       IMAGING    CT ANGIO CHEST PE W OR WO CONTRAST  Result Date: 10/26/2019 CLINICAL DATA:  Shortness of breath, COVID positive EXAM: CT ANGIOGRAPHY CHEST WITH CONTRAST TECHNIQUE: Multidetector CT imaging of the chest was performed using the standard protocol during bolus administration of intravenous contrast. Multiplanar CT image reconstructions and MIPs were obtained to evaluate the vascular anatomy. CONTRAST:  3mL OMNIPAQUE IOHEXOL 350 MG/ML SOLN COMPARISON:  None. FINDINGS: Cardiovascular: Satisfactory opacification of the pulmonary arteries to the proximal segmental level. No evidence of pulmonary embolism. Normal heart size. No pericardial effusion. Coronary artery and thoracic aorta  atherosclerotic calcification. Mediastinum/Nodes: No mediastinal, hilar, or axillary adenopathy. Thyroid is unremarkable. Esophagus appears normal. Lungs/Pleura: There is multifocal consolidation greatest at the periphery and lung bases. No pleural effusion or pneumothorax. Upper Abdomen: No acute abnormality. Musculoskeletal: No chest wall abnormality. No acute or significant osseous findings. Review of the MIP images confirms the above findings. IMPRESSION: No evidence of acute pulmonary embolism. Bilateral consolidative opacities likely reflecting pneumonia (including COVID-19). Aortic Atherosclerosis (ICD10-I70.0). Electronically Signed   By: Guadlupe Spanish M.D.   On: 10/26/2019 17:03   US Venous Img Lower Bilateral (DVT)  Result Date: 10/26/2019 CLINICAL DATA:  Lower extremity edema for 1 day EXAM: BILATERAL LOWER EXTREMITY VENOUS DOPPLER ULTRASOUND TECHNIQUE: Gray-scale sonography with graded compression, as well as color Doppler and duplex ultrasound were performed to evaluate the lower extremity deep venous  systems from the level of the common femoral vein and including the common femoral, femoral, profunda femoral, popliteal and calf veins including the posterior tibial, peroneal and gastrocnemius veins when visible. The superficial great saphenous vein was also interrogated. Spectral Doppler was utilized to evaluate flow at rest and with distal augmentation maneuvers in the common femoral, femoral and popliteal veins. COMPARISON:  None. FINDINGS: RIGHT LOWER EXTREMITY Common Femoral Vein: No evidence of thrombus. Normal compressibility, respiratory phasicity and response to augmentation. Saphenofemoral Junction: No evidence of thrombus. Normal compressibility and flow on color Doppler imaging. Profunda Femoral Vein: No evidence of thrombus. Normal compressibility and flow on color Doppler imaging. Femoral Vein: No evidence of thrombus. Normal compressibility, respiratory phasicity and response to augmentation. Popliteal Vein: No evidence of thrombus. Normal compressibility, respiratory phasicity and response to augmentation. Calf Veins: Calf posterior tibial and peroneal veins demonstrate intraluminal thrombus appearing occlusive in the posterior tibial vein and nonocclusive in the peroneal vein. Vessels are noncompressible. Very low thrombus burden. LEFT LOWER EXTREMITY Common Femoral Vein: No evidence of thrombus. Normal compressibility, respiratory phasicity and response to augmentation. Saphenofemoral Junction: No evidence of thrombus. Normal compressibility and flow on color Doppler imaging. Profunda Femoral Vein: No evidence of thrombus. Normal compressibility and flow on color Doppler imaging. Femoral Vein: No evidence of thrombus. Normal compressibility, respiratory phasicity and response to augmentation. Popliteal Vein: No evidence of thrombus. Normal compressibility, respiratory phasicity and response to augmentation. Calf Veins: No evidence of thrombus. Normal compressibility and flow on color Doppler  imaging. IMPRESSION: Positive exam for calf region right posterior tibial and peroneal vein DVT. Very low thrombus burden. No propagation into the popliteal or femoral veins. Negative for left lower extremity DVT. Electronically Signed   By: Judie Petit.  Shick M.D.   On: 10/26/2019 11:54   DG Chest Port 1 View  Result Date: 10/30/2019 CLINICAL DATA:  Shortness of breath. EXAM: PORTABLE CHEST 1 VIEW COMPARISON:  October 28, 2019. FINDINGS: Stable cardiomediastinal silhouette. No pneumothorax or pleural effusion is noted. Stable bilateral patchy airspace opacities are noted consistent with multifocal pneumonia. Bony thorax is unremarkable. IMPRESSION: Stable bilateral multifocal pneumonia. Electronically Signed   By: Lupita Raider M.D.   On: 10/30/2019 09:02   DG Chest Port 1 View  Result Date: 10/28/2019 CLINICAL DATA:  79 year old male with pneumonia. EXAM: PORTABLE CHEST 1 VIEW COMPARISON:  Chest radiograph dated 10/24/2019 and CT dated 10/26/2019. FINDINGS: Bilateral mid to lower lung field subpleural and streaky densities appear similar or slightly improved since the prior radiograph. No large pleural effusion or pneumothorax. Stable cardiomediastinal silhouette. Atherosclerotic calcification of the aorta. No acute osseous pathology. IMPRESSION: Similar or slightly improved pulmonary opacities. Continued follow-up  recommended. Electronically Signed   By: Anner Crete M.D.   On: 10/28/2019 20:37   DG Chest Port 1 View  Result Date: 10/24/2019 CLINICAL DATA:  Shortness of breath. EXAM: PORTABLE CHEST 1 VIEW COMPARISON:  October 20, 2019. FINDINGS: Stable cardiomediastinal silhouette. Atherosclerosis of thoracic aorta is noted. No pneumothorax or pleural effusion is noted. Mildly increased bilateral patchy ill-defined airspace opacities are noted consistent with multifocal pneumonia. Bony thorax is unremarkable. IMPRESSION: Aortic atherosclerosis. Mildly increased bilateral patchy airspace opacities are noted  consistent with multifocal pneumonia. Electronically Signed   By: Marijo Conception M.D.   On: 10/24/2019 08:08   DG Chest Port 1 View  Result Date: 10/20/2019 CLINICAL DATA:  Shortness of breath, COVID exposure. EXAM: PORTABLE CHEST 1 VIEW COMPARISON:  Chest radiograph dated 02/10/2011. FINDINGS: The heart appears enlarged. Vascular calcifications are seen in the aortic arch. Moderate airspace opacities are seen in the peripheral right mid lung and in the left lower lung. There is no pleural effusion or pneumothorax. The osseous structures are intact. IMPRESSION: Moderate airspace opacities in the peripheral right mid lung and left lower lung may reflect COVID-19 pneumonia. Electronically Signed   By: Zerita Boers M.D.   On: 10/20/2019 18:11        ASSESSMENT/PLAN   Acute hypoxemic respiratory failure due to COVID 19 induced pneumonia - Currently increased from 5 to 7L/min [place] - patient is s/p Vecluri and Actemra -CT chest - reviewed by me as above with bilateral multifocal atypical pneumonia - Solumedrol - changing to 40 BID  - Adding colchicine 0.6mg  PO daily - Chest phyisotherapy - bed PT - please encourage patient to use IS and flutter valve - I worked closely with patient and he is now able to inhale Vt at 5102ml  - will hold diuretic today because patient is borderline hypotensive - Unable to add any nebulized medication due to COVID precautions -PT/OT already ordered -will continue to follow along with you  - repeat CXR in am 11/03/19 to compare to previous from 10/30/19   DVT   - patient already on anticoagulation   - no signs of bleeding       Thank you for allowing me to participate in the care of this patient.     Patient/Family are satisfied with care plan and all questions have been answered.  This document was prepared using Dragon voice recognition software and may include unintentional dictation errors.     Ottie Glazier, M.D.  Division of [place]

## 2019-11-02 NOTE — Progress Notes (Signed)
Physical Therapy Treatment Patient Details Name: Walter Baxter MRN: [id number] DOB: 03/16/1941 Today's Date: 11/02/2019    History of Present Illness Pt is 79 y/o M who presented to ER secondary to SOB, generalized weakness; admitted for management of acute respiratory failure related to COVID-19 PNA.    PT Comments    Pt is making limited progress towards goals and mainly continues to be limited by O2 demand. Pt on 6L at rest this date with O2 sats at 91%. Increased to 9L of O2 with exertion and sats decrease to 80% while seated at EOB. RR increased to mid 30s and pt very SOB. Able to sit at EOB and perform pursed lip breathing, however O2 sats maintain at 83%. Returned back to bed and notified MD. Pt very frustrated with his breathing and feels he is getting weaker. At this time, updating recommendations to SNF as pt will need more consistent follow up for progression. Will continue to progress.  Follow Up Recommendations  SNF     Equipment Recommendations  Rolling walker with 5" wheels    Recommendations for Other Services       Precautions / Restrictions Precautions Precautions: Fall Restrictions Weight Bearing Restrictions: No    Mobility  Bed Mobility Overal bed mobility: Needs Assistance Bed Mobility: Supine to Sit     Supine to sit: Min guard Sit to supine: Min assist   General bed mobility comments: demonstrates safe technique with ease of transfer. Once seated becomes very SOB with RR increasing to 33. Able to maintain seated at EOB for extended time with intentions to increase mobility, however unable to achieve and needed to return back supine due to SOB symptoms.  Transfers                 General transfer comment: unable  Ambulation/Gait                 Stairs             Wheelchair Mobility    Modified Rankin (Stroke Patients Only)       Balance Overall balance assessment: Needs assistance Sitting-balance support: Feet  supported;No upper extremity supported Sitting balance-Leahy Scale: Good                                      Cognition Arousal/Alertness: Awake/alert Behavior During Therapy: WFL for tasks assessed/performed Overall Cognitive Status: Within Functional Limits for tasks assessed                                        Exercises Other Exercises Other Exercises: supine ther-ex performed on B LE including AP, quad sets, SLRs, hip abd/add, and flutter valve x 12 reps. All ther-ex performed x supervision. Rest breaks given as needed.     General Comments        Pertinent Vitals/Pain Pain Assessment: 0-10 Pain Score: 8  Pain Location: abdomen Pain Descriptors / Indicators: Sore Pain Intervention(s): Limited activity within patient's tolerance    Home Living                      Prior Function            PT Goals (current goals can now be found in the care plan section) Acute Rehab PT Goals Patient Stated  Goal: to return home PT Goal Formulation: With patient Time For Goal Achievement: 11/07/19 Potential to Achieve Goals: Good Progress towards PT goals: Progressing toward goals    Frequency    Min 2X/week      PT Plan Discharge plan needs to be updated    Co-evaluation              AM-PAC PT "6 Clicks" Mobility   Outcome Measure  Help needed turning from your back to your side while in a flat bed without using bedrails?: A Little Help needed moving from lying on your back to sitting on the side of a flat bed without using bedrails?: A Little Help needed moving to and from a bed to a chair (including a wheelchair)?: A Little Help needed standing up from a chair using your arms (e.g., wheelchair or bedside chair)?: A Little Help needed to walk in hospital room?: A Lot Help needed climbing 3-5 steps with a railing? : A Lot 6 Click Score: 16    End of Session Equipment Utilized During Treatment: Oxygen Activity  Tolerance: Patient limited by fatigue Patient left: in bed;with call bell/phone within reach;with bed alarm set Nurse Communication: Mobility status PT Visit Diagnosis: Difficulty in walking, not elsewhere classified (R26.2);Muscle weakness (generalized) (M62.81)     Time: 2951-8841 PT Time Calculation (min) (ACUTE ONLY): 26 min  Charges:  $Therapeutic Exercise: 23-37 mins                     Greggory Stallion, [place], DPT [phone]    Jearlene Bridwell 11/02/2019, 78:03 PM

## 2019-11-02 NOTE — Progress Notes (Signed)
PROGRESS NOTE    [name]  YQM:[id number] DOB: Jun 22, 1941 DOA: 10/20/2019 PCP: Lupita Raider, MD   Brief Narrative:  Walter Baxter a 79 y.o.malewith medical history significant fordiet-controlled diabetes, hypertension, and hyperlipidemia who presents to the ED for evaluation of shortness of breath.Patient reports new sudden onset of significant generalized weakness, fatigue, and shortness of breath beginning the night before admission. He has had associated intermittent fevers, chills, loss of taste/smell, and rare nonproductive cough. Admitted for COVID-19 pneumonia, treated with remdesivir, Decadron and Actemra.  Continue to require O2 supplement Wife was also admitted for COVID-19 infection.  Subjective: C./O intermittent abdominal pain mostly lower, worse after taking meds?? Had a normal BM this morning, No N/V. Still SOB.  Assessment & Plan:   Principal Problem:   Acute hypoxemic respiratory failure due to COVID-19 [facility]) Active Problems:   Essential hypertension   Hyperlipidemia   Type 2 diabetes mellitus (HCC)  Acute hypoxic failure secondary to COVID-19 pneumonia.  Completed a course of remdesivir.  He was given Actemra X I. Lower extremity Doppler was positive for thrombus in right posterior tibial and peroneal vein.  D-dimer improving with CRP has been normalized.  CTA was negative for PE. Seems improving, now saturating in low 90s on 6 L at rest, increase to 9 with any activity. -Switched Decadron with Solu-Medrol 40 mg twice daily. -Pulmonary started him on Bactrim for PCP prophylaxis. -Colchicine was added by pulmonary yesterday. -He was started on Mucomyst. -Continue Xarelto for DVT. -Continue zinc and vitamin C -Continue incentive spirometry. -Might have to go home with home oxygen. -PT/OT recommending SNF placement but patient and wife decided to go back home with home health services. -Consult pulmonary for worsening hypoxia-appreciate their  recommendations.  Leukocytosis/UTI.  Patient has leukocytosis which seems improving now. Partly can be due to steroid.   CXR- Stable UA -With leukocytes and bacteria- no urinary symptoms. Urine culture is growing more than 100,000 colonies of E. Coli-with good sensitivity. -Discontinue ceftriaxone. -Start him on Bactrim. PCT. <0.10>>0.16 -Check MRCA swab-negative  Abdominal Pain.Very non specific. No red flag currently. -Will observe, if getting worse, will get CT abdomen.  Hypertension.  Blood pressure was within goal. -Continue to monitor. -Continue Lopressor if heart rate within normal limit. -Can use hydralazine as needed for systolic above 160.  Type 2 diabetes mellitus.  Well-controlled with A1c of 6.1 And had some elevated blood sugar levels most likely secondary to steroid. CBG mostly in 200s now. -Increase Levemir to 10 units at bedtime. -NovoLog 3 units with meals. -Continue to monitor and SSI.  Acute urinary retention.  Unclear etiology, most likely secondary to BPH. Has been resolved.  Patient is urinating without any difficulty. -Continue Flomax.  Objective: Vitals:   11/01/19 1650 11/01/19 2037 11/01/19 2355 11/02/19 0728  BP: (!) 143/60  127/79 127/60  Pulse: 73  66   Resp: (!) 23  (!) 22 20  Temp:   (!) 97.5 F (36.4 C) (!) 97.5 F (36.4 C)  TempSrc:      SpO2: 90% 95% 96%   Weight:      Height:        Intake/Output Summary (Last 24 hours) at 11/02/2019 0817 Last data filed at 11/02/2019 0445 Gross per 24 hour  Intake --  Output 1900 ml  Net -1900 ml   Filed Weights   10/20/19 1724 10/20/19 2331 10/22/19 0532  Weight: 105.2 kg 99.9 kg 98.7 kg    Examination:  General exam: Conically ill-appearing elderly man,  appears calm and comfortable. Respiratory system: Clear bilaterally. Respiratory effort normal. Cardiovascular system: S1 & S2 heard, RRR. No JVD, murmurs, rubs, gallops or clicks. Gastrointestinal system: Soft, nontender, nondistended,  bowel sounds positive. Central nervous system: Alert and oriented. No focal neurological deficits.Symmetric 5 x 5 power. Extremities: No edema, no cyanosis, pulses intact and symmetrical. Skin: No rashes, lesions or ulcers Psychiatry: Judgement and insight appear normal.    DVT prophylaxis: Xarelto Code Status: Full Family Communication: No family at bed site. Disposition Plan: Pending improvement, most likely next couple of days.  Might go home with home oxygen if fail to wean off.  He will go back home with home health services.  Consultants:   Pulmonary  Procedures:  Antimicrobials:  Bactrim  Data Reviewed: I have personally reviewed following labs and imaging studies  CBC: Recent Labs  Lab 10/28/19 0600 10/30/19 0352 11/01/19 0548 11/02/19 0424  WBC 16.7* 21.2* 18.0* 16.1*  HGB 15.0 14.4 14.4 14.9  HCT 44.3 43.9 43.9 44.6  MCV 88.4 90.0 90.0 88.7  PLT 242 200 152 935*   Basic Metabolic Panel: Recent Labs  Lab 10/26/19 0841 10/28/19 0600 10/30/19 0352 11/01/19 0548 11/02/19 0424  NA 138 138 138 136 138  K 4.4 4.8 4.9 5.1 4.9  CL 107 107 107 104 102  CO2 21* 22 22 24 28   GLUCOSE 201* 221* 240* 228* 210*  BUN 29* 31* 36* 29* 39*  CREATININE 0.96 0.96 1.02 0.97 1.16  CALCIUM 7.6* 7.8* 7.8* 7.7* 7.9*   GFR: Estimated Creatinine Clearance: 60.7 mL/min (by C-G formula based on SCr of 1.16 mg/dL). Liver Function Tests: No results for input(s): AST, ALT, ALKPHOS, BILITOT, PROT, ALBUMIN in the last 168 hours. No results for input(s): LIPASE, AMYLASE in the last 168 hours. No results for input(s): AMMONIA in the last 168 hours. Coagulation Profile: No results for input(s): INR, PROTIME in the last 168 hours. Cardiac Enzymes: No results for input(s): CKTOTAL, CKMB, CKMBINDEX, TROPONINI in the last 168 hours. BNP (last 3 results) No results for input(s): PROBNP in the last 8760 hours. HbA1C: No results for input(s): HGBA1C in the last 72 hours. CBG: Recent  Labs  Lab 11/01/19 0728 11/01/19 1135 11/01/19 1625 11/01/19 2112 11/02/19 0745  GLUCAP 209* 219* 174* 210* 224*   Lipid Profile: No results for input(s): CHOL, HDL, LDLCALC, TRIG, CHOLHDL, LDLDIRECT in the last 72 hours. Thyroid Function Tests: No results for input(s): TSH, T4TOTAL, FREET4, T3FREE, THYROIDAB in the last 72 hours. Anemia Panel: No results for input(s): VITAMINB12, FOLATE, FERRITIN, TIBC, IRON, RETICCTPCT in the last 72 hours. Sepsis Labs: Recent Labs  Lab 10/26/19 0841 10/30/19 0352 10/31/19 0311 11/01/19 0548  PROCALCITON <0.10 <0.10 0.16 <0.10    Recent Results (from the past 240 hour(s))  Urine Culture     Status: Abnormal   Collection Time: 10/30/19 11:14 AM   Specimen: Urine, Random  Result Value Ref Range Status   Specimen Description   Final    URINE, RANDOM Performed at [facility], [address], [place], [place] [address]    Special Requests   Final    NONE Performed at [facility], [place] [place], [place] [address]    Culture >=100,000 COLONIES/mL ESCHERICHIA COLI (A)  Final   Report Status 11/01/2019 FINAL  Final   Organism ID, Bacteria ESCHERICHIA COLI (A)  Final      Susceptibility   Escherichia coli - MIC*    AMPICILLIN >=32 RESISTANT Resistant  CEFAZOLIN <=4 SENSITIVE Sensitive     CEFTRIAXONE <=0.25 SENSITIVE Sensitive     CIPROFLOXACIN >=4 RESISTANT Resistant     GENTAMICIN <=1 SENSITIVE Sensitive     IMIPENEM <=0.25 SENSITIVE Sensitive     NITROFURANTOIN <=16 SENSITIVE Sensitive     TRIMETH/SULFA <=20 SENSITIVE Sensitive     AMPICILLIN/SULBACTAM >=32 RESISTANT Resistant     PIP/TAZO <=4 SENSITIVE Sensitive     * >=100,000 COLONIES/mL ESCHERICHIA COLI  MRSA PCR Screening     Status: None   Collection Time: 10/30/19 11:30 AM   Specimen: Nasal Mucosa; Nasopharyngeal  Result Value Ref Range Status   MRSA by PCR NEGATIVE NEGATIVE Final    Comment:        The GeneXpert MRSA Assay  (FDA approved for NASAL specimens only), is one component of a comprehensive MRSA colonization surveillance program. It is not intended to diagnose MRSA infection nor to guide or monitor treatment for MRSA infections. Performed at [facility], [address] [place], [place] [address]      Radiology Studies: No results found.  Scheduled Meds: . vitamin C  500 mg Oral Daily  . colchicine  0.6 mg Oral Daily  . dextromethorphan-guaiFENesin  1 tablet Oral BID  . famotidine  20 mg Oral BID  . furosemide  40 mg Intravenous Daily  . insulin aspart  0-9 Units Subcutaneous TID WC  . insulin aspart  3 Units Subcutaneous TID WC  . insulin detemir  5 Units Subcutaneous QHS  . Ipratropium-Albuterol  1 puff Inhalation Q6H  . methylPREDNISolone (SOLU-MEDROL) injection  40 mg Intravenous Q12H  . Ensure Max Protein  11 oz Oral BID BM  . rivaroxaban  15 mg Oral BID AC   Followed by  . [START ON 11/16/2019] rivaroxaban  20 mg Oral Q supper  . sodium chloride flush  3 mL Intravenous Q12H  . sulfamethoxazole-trimethoprim  1 tablet Oral Q12H  . tamsulosin  0.4 mg Oral Daily  . zinc sulfate  220 mg Oral Daily   Continuous Infusions: . sodium chloride Stopped (10/24/19 1900)  . sodium chloride       LOS: 13 days   Time spent: 40 minutes.  Arnetha Courser, MD [facility]  If 7PM-7AM, please contact night-coverage 11/02/2019, 8:17 AM   This record has been created using Dragon voice recognition software. Errors have been sought and corrected,but may not always be located. Such creation errors do not reflect on the standard of care.

## 2019-11-03 ENCOUNTER — Inpatient Hospital Stay: Payer: Medicare Other

## 2019-11-03 ENCOUNTER — Ambulatory Visit: Payer: Medicare Other | Admitting: Allergy

## 2019-11-03 LAB — CBC
HCT: 45.7 % (ref 39.0–52.0)
Hemoglobin: 15.3 g/dL (ref 13.0–17.0)
MCH: 29.8 pg (ref 26.0–34.0)
MCHC: 33.5 g/dL (ref 30.0–36.0)
MCV: 88.9 fL (ref 80.0–100.0)
Platelets: 159 10*3/uL (ref 150–400)
RBC: 5.14 MIL/uL (ref 4.22–5.81)
RDW: 14.3 % (ref 11.5–15.5)
WBC: 18.5 10*3/uL — ABNORMAL HIGH (ref 4.0–10.5)
nRBC: 0 % (ref 0.0–0.2)

## 2019-11-03 LAB — BASIC METABOLIC PANEL
Anion gap: 7 (ref 5–15)
BUN: 51 mg/dL — ABNORMAL HIGH (ref 8–23)
CO2: 28 mmol/L (ref 22–32)
Calcium: 8 mg/dL — ABNORMAL LOW (ref 8.9–10.3)
Chloride: 100 mmol/L (ref 98–111)
Creatinine, Ser: 1.26 mg/dL — ABNORMAL HIGH (ref 0.61–1.24)
GFR calc Af Amer: >60 mL/min (ref >60)
GFR calc non Af Amer: 55 mL/min — ABNORMAL LOW (ref >60)
Glucose, Bld: 209 mg/dL — ABNORMAL HIGH (ref 70–99)
Potassium: 4.8 mmol/L (ref 3.5–5.1)
Sodium: 135 mmol/L (ref 135–145)

## 2019-11-03 LAB — GLUCOSE, CAPILLARY
Glucose-Capillary: 225 mg/dL — ABNORMAL HIGH (ref 70–99)
Glucose-Capillary: 227 mg/dL — ABNORMAL HIGH (ref 70–99)
Glucose-Capillary: 230 mg/dL — ABNORMAL HIGH (ref 70–99)
Glucose-Capillary: 154 mg/dL — ABNORMAL HIGH (ref 70–99)

## 2019-11-03 MED ORDER — INSULIN DETEMIR 100 UNIT/ML ~~LOC~~ SOLN
8.0000 [IU] | Freq: Two times a day (BID) | SUBCUTANEOUS | Status: DC
  Administered 2019-11-03 – 2019-11-04 (×2): 8 [IU] via SUBCUTANEOUS
  Filled 2019-11-03 (×4): qty 0.08

## 2019-11-03 MED ORDER — CHLORHEXIDINE GLUCONATE CLOTH 2 % EX PADS
6.0000 | MEDICATED_PAD | Freq: Every day | CUTANEOUS | Status: DC
  Administered 2019-11-03 – 2019-11-05 (×3): 6 via TOPICAL

## 2019-11-03 MED ORDER — CHLORHEXIDINE GLUCONATE 0.12 % MT SOLN
15.0000 mL | Freq: Two times a day (BID) | OROMUCOSAL | Status: DC
  Administered 2019-11-03 – 2019-11-05 (×4): 15 mL via OROMUCOSAL
  Filled 2019-11-03 (×4): qty 15

## 2019-11-03 MED ORDER — SULFAMETHOXAZOLE-TRIMETHOPRIM 400-80 MG PO TABS
1.0000 | ORAL_TABLET | Freq: Two times a day (BID) | ORAL | Status: DC
  Administered 2019-11-03 – 2019-11-05 (×4): 1 via ORAL
  Filled 2019-11-03 (×5): qty 1

## 2019-11-03 MED ORDER — INSULIN ASPART 100 UNIT/ML ~~LOC~~ SOLN
6.0000 [IU] | Freq: Three times a day (TID) | SUBCUTANEOUS | Status: DC
  Administered 2019-11-03 – 2019-11-05 (×6): 6 [IU] via SUBCUTANEOUS
  Filled 2019-11-03 (×5): qty 1

## 2019-11-03 MED ORDER — ORAL CARE MOUTH RINSE
15.0000 mL | Freq: Two times a day (BID) | OROMUCOSAL | Status: DC
  Administered 2019-11-04 – 2019-11-05 (×3): 15 mL via OROMUCOSAL

## 2019-11-03 MED ORDER — CEFDINIR 300 MG PO CAPS
300.0000 mg | ORAL_CAPSULE | Freq: Two times a day (BID) | ORAL | Status: DC
  Administered 2019-11-03: 300 mg via ORAL
  Filled 2019-11-03 (×2): qty 1

## 2019-11-03 NOTE — Progress Notes (Signed)
Inpatient Diabetes Program Recommendations  AACE/ADA: New Consensus Statement on Inpatient Glycemic Control   Target Ranges:  Prepandial:   less than 140 mg/dL      Peak postprandial:   less than 180 mg/dL (1-2 hours)      Critically ill patients:  140 - 180 mg/dL   Results for FITZGERALD, DUNNE (MRN [id number]) as of 11/03/2019 11:52  Ref. Range 11/02/2019 07:45 11/02/2019 11:43 11/02/2019 16:21 11/02/2019 21:37 11/03/2019 07:32 11/03/2019 11:37  Glucose-Capillary Latest Ref Range: 70 - 99 mg/dL 655 (H) 374 (H) 827 (H) 277 (H) 225 (H) 227 (H)   Review of Glycemic Control  Diabetes history:DM2 Outpatient Diabetes medications:None; diet controlled Current orders for Inpatient glycemic control:Levemir 5 units BID,Novolog 0-9 units TID with meals, Novolog 3 units TID with meals; Solumedrol 40 mg Q12H  Inpatient Diabetes Program Recommendations:  Insulin-Basal:If steroids are continued, please consider increasing Levemir to 8 units BID.  Insulin-Meal Coverage: If steroids are continued as ordered, please consider increasing meal coverage to Novolog 6 units TID with meals if patient eats at least 50% of meals.  Thanks, Orlando Penner, RN, MSN, CDE Diabetes Coordinator Inpatient Diabetes Program [phone] (Team Pager from 8am to 5pm)

## 2019-11-03 NOTE — Progress Notes (Signed)
Paged by nursing staff that patient is becoming more hypoxic while transferring to chair requiring 13-14 L. He was unable to tolerate proning.  Talked with his pulmonologist Dr. Karna Christmas and will transfer him to stepdown. There is nothing much we can offer at this time.

## 2019-11-03 NOTE — Progress Notes (Signed)
This nurse called into pt room @approximately  1100 by NT Kristen. Pt sating 84% on 5L [place] stating he felt like he wasn't getting any air. Moved pt to 6L HFNC with no adjustment in O2 sats. Bumped pt up to 12L HFNC and pt sats fluctuating between 85-93% while sitting in chair. Pt desats any time he so much as leans over to grab something. Pt cannot tolerate laying prone and gasps for air when he is on his side. MD made aware. Orders to transfer to step down placed.

## 2019-11-03 NOTE — Progress Notes (Addendum)
PROGRESS NOTE    Walter Baxter  WJX:[id number] DOB: 03-02-1941 DOA: 10/20/2019 PCP: Mayra Neer, MD   Brief Narrative:  Walter Baxter a 79 y.o.malewith medical history significant fordiet-controlled diabetes, hypertension, and hyperlipidemia who presents to the ED for evaluation of shortness of breath.Patient reports new sudden onset of significant generalized weakness, fatigue, and shortness of breath beginning the night before admission. He has had associated intermittent fevers, chills, loss of taste/smell, and rare nonproductive cough. Admitted for COVID-19 pneumonia, treated with remdesivir, Decadron and Actemra.  Continue to require O2 supplement Wife was also admitted for COVID-19 infection.  Subjective: Pt. Was feeling little better when seen today. No new complaints. We discussed trying proneting to see if that helps.  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  Completed a course of remdesivir.  He was given Actemra X I. Lower extremity Doppler was positive for thrombus in right posterior tibial and peroneal vein.  D-dimer improving with CRP has been normalized.  CTA was negative for PE. Seems improving, now saturating in low 90s on 6 L at rest -Switched Decadron with Solu-Medrol 40 mg twice daily. -Pulmonary started him on Bactrim for PCP prophylaxis. -Colchicine was added by pulmonary yesterday. -He was started on Mucomyst. -Continue Xarelto for DVT. -Continue zinc and vitamin C -Continue incentive spirometry. -Might have to go home with home oxygen. -PT/OT recommending SNF placement but patient and wife decided to go back home with home health services. -Consult pulmonary for worsening hypoxia-appreciate their  recommendations. -We will try pronating for couple of hours daily to see if that help with his oxygenation. -As patient continued to require high levels of oxygen.  He is high risk for deterioration and appears to have poor prognosis.  Leukocytosis/UTI.  Patient has leukocytosis which seems improving now. Partly can be due to steroid.   CXR- Stable UA -With leukocytes and bacteria- no urinary symptoms. Urine culture is growing more than 100,000 colonies of E. Coli-with good sensitivity. -Start him on cefdinir as bactrim was causing abdominal discomfort. -D/C Bactrim. PCT. <0.10>>0.16 -Check MRCA swab-negative  Abdominal Pain.Very non specific. No red flag currently. Mostly occurring after bactrim- we will dc bactrim. -Will observe.  AKI. Creatinine elevated today at 1.26. DC Bactrim. Encourage PO hydration. Monitor renal function. Avoid nephrotoxic.  Hypertension.  Blood pressure was within goal. -Continue to monitor. -Continue Lopressor if heart rate within normal limit. -Can use hydralazine as needed for systolic above 213.  Type 2 diabetes mellitus.  Well-controlled with A1c of 6.1 And had some elevated blood sugar levels most likely secondary to steroid. CBG mostly in 200s now. -Increase Levemir to 10 units at bedtime. -NovoLog 3 units with meals. -Continue to monitor and SSI.  Acute urinary retention.  Unclear etiology, most likely secondary to BPH. Has been resolved.  Patient is urinating without any difficulty. -Continue Flomax.  Objective: Vitals:   11/02/19 0728 11/02/19 1522 11/02/19 2326 11/03/19 0734  BP: 127/60  (!) 107/47 (!) 96/55  Pulse:   82 80  Resp: 20  17 16   Temp: (!) 97.5 F (36.4 C) 97.8 F (36.6 C) 98.2 F (36.8 C) 97.8 F (36.6 C)  TempSrc:   Oral Oral  SpO2:  90% 94% 90%  Weight:      Height:  Intake/Output Summary (Last 24 hours) at 11/03/2019 0742 Last data filed at 11/03/2019 0737 Gross per 24 hour  Intake 120 ml  Output  1585 ml  Net -1465 ml   Filed Weights   10/20/19 1724 10/20/19 2331 10/22/19 0532  Weight: 105.2 kg 99.9 kg 98.7 kg    Examination:  General exam: Conically ill-appearing elderly man, appears calm and comfortable. Respiratory system: Clear bilaterally. Respiratory effort normal. Cardiovascular system: S1 & S2 heard, RRR. No JVD, murmurs, rubs, gallops or clicks. Gastrointestinal system: Soft, nontender, nondistended, bowel sounds positive. Central nervous system: Alert and oriented. No focal neurological deficits.Symmetric 5 x 5 power. Extremities: No edema, no cyanosis, pulses intact and symmetrical. Skin: No rashes, lesions or ulcers Psychiatry: Judgement and insight appear normal.    DVT prophylaxis: Xarelto Code Status: Full Family Communication: Wife was updated on phone. Disposition Plan: Pending improvement, most likely next couple of days.  Might go home with home oxygen if fail to wean off.  He will go back home with home health services. Patient is high risk for deterioration with poor prognosis as continue to require high levels of oxygen and worsening hypoxia.  Consultants:   Pulmonary  Procedures:  Antimicrobials:  Bactrim  Data Reviewed: I have personally reviewed following labs and imaging studies  CBC: Recent Labs  Lab 10/28/19 0600 10/30/19 0352 11/01/19 0548 11/02/19 0424 11/03/19 0522  WBC 16.7* 21.2* 18.0* 16.1* 18.5*  HGB 15.0 14.4 14.4 14.9 15.3  HCT 44.3 43.9 43.9 44.6 45.7  MCV 88.4 90.0 90.0 88.7 88.9  PLT 242 200 152 146* 159   Basic Metabolic Panel: Recent Labs  Lab 10/28/19 0600 10/30/19 0352 11/01/19 0548 11/02/19 0424 11/03/19 0522  NA 138 138 136 138 135  K 4.8 4.9 5.1 4.9 4.8  CL 107 107 104 102 100  CO2 22 22 24 28 28   GLUCOSE 221* 240* 228* 210* 209*  BUN 31* 36* 29* 39* 51*  CREATININE 0.96 1.02 0.97 1.16 1.26*  CALCIUM 7.8* 7.8* 7.7* 7.9* 8.0*   GFR: Estimated Creatinine Clearance: 55.9 mL/min (A) (by C-G formula  based on SCr of 1.26 mg/dL (H)). Liver Function Tests: No results for input(s): AST, ALT, ALKPHOS, BILITOT, PROT, ALBUMIN in the last 168 hours. No results for input(s): LIPASE, AMYLASE in the last 168 hours. No results for input(s): AMMONIA in the last 168 hours. Coagulation Profile: No results for input(s): INR, PROTIME in the last 168 hours. Cardiac Enzymes: No results for input(s): CKTOTAL, CKMB, CKMBINDEX, TROPONINI in the last 168 hours. BNP (last 3 results) No results for input(s): PROBNP in the last 8760 hours. HbA1C: No results for input(s): HGBA1C in the last 72 hours. CBG: Recent Labs  Lab 11/02/19 0745 11/02/19 1143 11/02/19 1621 11/02/19 2137 11/03/19 0732  GLUCAP 224* 202* 218* 277* 225*   Lipid Profile: No results for input(s): CHOL, HDL, LDLCALC, TRIG, CHOLHDL, LDLDIRECT in the last 72 hours. Thyroid Function Tests: No results for input(s): TSH, T4TOTAL, FREET4, T3FREE, THYROIDAB in the last 72 hours. Anemia Panel: No results for input(s): VITAMINB12, FOLATE, FERRITIN, TIBC, IRON, RETICCTPCT in the last 72 hours. Sepsis Labs: Recent Labs  Lab 10/30/19 0352 10/31/19 0311 11/01/19 0548  PROCALCITON <0.10 0.16 <0.10    Recent Results (from the past 240 hour(s))  Urine Culture     Status: Abnormal   Collection Time: 10/30/19 11:14 AM   Specimen: Urine, Random  Result Value Ref Range Status   Specimen Description   Final    URINE, RANDOM  Performed at [facility], [address] [place], [place] [address]    Special Requests   Final    NONE Performed at [facility], [address] [place], [place] [address]    Culture >=100,000 COLONIES/mL ESCHERICHIA COLI (A)  Final   Report Status 11/01/2019 FINAL  Final   Organism ID, Bacteria ESCHERICHIA COLI (A)  Final      Susceptibility   Escherichia coli - MIC*    AMPICILLIN >=32 RESISTANT Resistant     CEFAZOLIN <=4 SENSITIVE Sensitive     CEFTRIAXONE <=0.25 SENSITIVE Sensitive      CIPROFLOXACIN >=4 RESISTANT Resistant     GENTAMICIN <=1 SENSITIVE Sensitive     IMIPENEM <=0.25 SENSITIVE Sensitive     NITROFURANTOIN <=16 SENSITIVE Sensitive     TRIMETH/SULFA <=20 SENSITIVE Sensitive     AMPICILLIN/SULBACTAM >=32 RESISTANT Resistant     PIP/TAZO <=4 SENSITIVE Sensitive     * >=100,000 COLONIES/mL ESCHERICHIA COLI  MRSA PCR Screening     Status: None   Collection Time: 10/30/19 11:30 AM   Specimen: Nasal Mucosa; Nasopharyngeal  Result Value Ref Range Status   MRSA by PCR NEGATIVE NEGATIVE Final    Comment:        The GeneXpert MRSA Assay (FDA approved for NASAL specimens only), is one component of a comprehensive MRSA colonization surveillance program. It is not intended to diagnose MRSA infection nor to guide or monitor treatment for MRSA infections. Performed at [facility], [address] [place], [place] [address]      Radiology Studies: No results found.  Scheduled Meds: . vitamin C  500 mg Oral Daily  . colchicine  0.6 mg Oral Daily  . dextromethorphan-guaiFENesin  1 tablet Oral BID  . famotidine  20 mg Oral BID  . insulin aspart  0-9 Units Subcutaneous TID WC  . insulin aspart  3 Units Subcutaneous TID WC  . insulin detemir  5 Units Subcutaneous BID  . Ipratropium-Albuterol  1 puff Inhalation Q6H  . methylPREDNISolone (SOLU-MEDROL) injection  40 mg Intravenous Q12H  . Ensure Max Protein  11 oz Oral BID BM  . rivaroxaban  15 mg Oral BID AC   Followed by  . [START ON 11/16/2019] rivaroxaban  20 mg Oral Q supper  . sodium chloride flush  3 mL Intravenous Q12H  . sucralfate  1 g Oral TID WC & HS  . sulfamethoxazole-trimethoprim  1 tablet Oral Q12H  . tamsulosin  0.4 mg Oral Daily  . zinc sulfate  220 mg Oral Daily   Continuous Infusions: . sodium chloride Stopped (10/24/19 1900)  . sodium chloride       LOS: 14 days   Time spent: 40 minutes.  Arnetha Courser, MD [facility]  If 7PM-7AM, please contact  night-coverage 11/03/2019, 7:42 AM   This record has been created using Dragon voice recognition software. Errors have been sought and corrected,but may not always be located. Such creation errors do not reflect on the standard of care.

## 2019-11-03 NOTE — Progress Notes (Signed)
CRITICAL CARE PROGRESS NOTE          Date: 11/03/2019,   MRN# [id number] Walter Baxter 78/16/1942     AdmissionWeight: 105.2 kg                 CurrentWeight: 98.7 kg      CHIEF COMPLAINT:   Increased O2 requirement with COVID 19 pneumonitis   HISTORY OF PRESENT ILLNESS   Patient is clinically improved.  He diuresed well yesterday , appx 2L.  He feels similar to before but states he had abdominal pain with meal, I will start carafate today.    He is still at appx 550cc on incentive spirometry.     PAST MEDICAL HISTORY   Past Medical History:  Diagnosis Date  . Allergic rhinitis   . Angio-edema   . Chronic bronchitis (HCC)   . Erectile dysfunction   . Essential hypertension   . Hyperlipidemia   . Obesity   . Type 2 diabetes mellitus (HCC)   [id number]   SURGICAL HISTORY   Past Surgical History:  Procedure Laterality Date  . ADENOIDECTOMY    . TONSILLECTOMY       FAMILY HISTORY   Family History  Problem Relation Age of Onset  . Lupus Sister      SOCIAL HISTORY   Social History   Tobacco Use  . Smoking status: Never Smoker  . Smokeless tobacco: Never Used  Substance Use Topics  . Alcohol use: Not Currently  . Drug use: Never     MEDICATIONS    Home Medication:    Current Medication:  Current Facility-Administered Medications:  .  0.9 %  sodium chloride infusion, , Intravenous, PRN, Rolly Salter, MD, Stopped at 10/24/19 1900 .  acetaminophen (TYLENOL) tablet 650 mg, 650 mg, Oral, Q6H PRN, Charlsie Quest, MD, 650 mg at 11/01/19 1812 .  ascorbic acid (VITAMIN C) tablet 500 mg, 500 mg, Oral, Daily, Darreld Mclean R, MD, 500 mg at 11/02/19 2131 .  cefdinir (OMNICEF) capsule 300 mg, 300 mg, Oral, Q12H, [name], Sumayya, MD .  colchicine tablet 0.6 mg, 0.6 mg, Oral, Daily, Raneisha Bress, MD, 0.6 mg at 11/02/19 0942 .  dextromethorphan-guaiFENesin (MUCINEX DM) 30-600 MG per 12 hr tablet 1 tablet, 1 tablet, Oral, BID, Vida Rigger, MD, 1 tablet at 11/02/19 0831 .  famotidine (PEPCID) tablet 20 mg, 20 mg, Oral, BID, Darreld Mclean R, MD, 20 mg at 11/03/19 0809 .  guaiFENesin-dextromethorphan (ROBITUSSIN DM) 100-10 MG/5ML syrup 10 mL, 10 mL, Oral, Q4H PRN, Darreld Mclean R, MD, 10 mL at 10/29/19 0831 .  insulin aspart (novoLOG) injection 0-9 Units, 0-9 Units, Subcutaneous, TID WC, Rolly Salter, MD, 3 Units at 11/03/19 0809 .  insulin aspart (novoLOG) injection 3 Units, 3 Units, Subcutaneous, TID WC, Arnetha Courser, MD, 3 Units at 11/03/19 [phone] .  insulin detemir (LEVEMIR) injection 5 Units, 5 Units, Subcutaneous, BID, Arnetha Courser, MD, 5 Units at 11/03/19 [phone] .  Ipratropium-Albuterol (COMBIVENT) respimat 1 puff, 1 puff, Inhalation, Q6H, Charlsie Quest, MD, 1 puff at 11/03/19 0808 .  methylPREDNISolone sodium succinate (SOLU-MEDROL) 40 mg/mL injection 40 mg, 40 mg, Intravenous, Q12H, Karna Christmas, Helga Asbury, MD, 40 mg at 11/03/19 0024 .  ondansetron (ZOFRAN) tablet 4 mg, 4 mg, Oral, Q6H PRN **OR** ondansetron (ZOFRAN) injection 4 mg, 4 mg, Intravenous, Q6H PRN, Allena Katz, Vishal R, MD .  protein supplement (ENSURE MAX) liquid, 11 oz, Oral, BID BM, Arnetha Courser, MD, 11 oz at 11/03/19 0901 .  Rivaroxaban (XARELTO) tablet 15 mg, 15 mg, Oral, BID AC, 15 mg at 11/03/19 0809 **FOLLOWED BY** [START ON 11/16/2019] rivaroxaban (XARELTO) tablet 20 mg, 20 mg, Oral, Q supper, [name], Sumayya, MD .  sodium chloride 0.9 % bolus 500 mL, 500 mL, Intravenous, Once, [name], Sumayya, MD .  sodium chloride flush (NS) 0.9 % injection 3 mL, 3 mL, Intravenous, Q12H, Zada Finders R, MD, 3 mL at 11/03/19 0809 .  sucralfate (CARAFATE) 1 GM/10ML suspension 1 g, 1 g, Oral, TID WC & HS, Samamtha Tiegs, MD, 1 g at 11/03/19 0808 .  tamsulosin (FLOMAX) capsule 0.4 mg, 0.4 mg, Oral, Daily, Lavina Hamman, MD, 0.4 mg at 11/03/19 0809 .  zinc sulfate capsule 220 mg, 220 mg, Oral, Daily, Zada Finders R, MD, 220 mg at 11/02/19 2130    ALLERGIES    Other     REVIEW OF SYSTEMS    Review of Systems:  Gen:  Denies  fever, sweats, chills weigh loss  HEENT: Denies blurred vision, double vision, ear pain, eye pain, hearing loss, nose bleeds, sore throat Cardiac:  No dizziness, chest pain or heaviness, chest tightness,edema Resp:   Reports minimal cough,sputum porduction, but significant shortness of breath, however without wheezing, and no hemoptysis,  Gi: Denies swallowing difficulty, stomach pain, nausea or vomiting, diarrhea, constipation, bowel incontinence Gu:  Denies bladder incontinence, burning urine Ext:   Denies Joint pain, stiffness or swelling Skin: Denies  skin rash, easy bruising or bleeding or hives Endoc:  Denies polyuria, polydipsia , polyphagia or weight change Psych:   Denies depression, insomnia or hallucinations   Other:  All other systems negative   VS: BP (!) 96/55 (BP Location: Left Arm)   Pulse 80   Temp 97.8 F (36.6 C) (Oral)   Resp 16   Ht 5\' 8"  (1.727 m)   Wt 98.7 kg   SpO2 90%   BMI 33.07 kg/m      PHYSICAL EXAM    GENERAL:NAD, no fevers, chills, no weakness no fatigue HEAD: Normocephalic, atraumatic.  EYES: Pupils equal, round, reactive to light. Extraocular muscles intact. No scleral icterus.  MOUTH: Moist mucosal membrane. Dentition intact. No abscess noted.  EAR, NOSE, THROAT: Clear without exudates. No external lesions.  NECK: Supple. No thyromegaly. No nodules. No JVD.  PULMONARY: Diffuse coarse rhonchi bilaterally  No wheezes no crackles CARDIOVASCULAR: S1 and S2. Regular rate and rhythm. No murmurs, rubs, or gallops. No edema. Pedal pulses 2+ bilaterally.  GASTROINTESTINAL: Soft, nontender, nondistended. No masses. Positive bowel sounds. No hepatosplenomegaly.  MUSCULOSKELETAL: No swelling, clubbing, or edema. Range of motion full in all extremities.  NEUROLOGIC: Cranial nerves II through XII are intact. No gross focal neurological deficits. Sensation intact. Reflexes intact.   SKIN: No ulceration, lesions, rashes, or cyanosis. Skin warm and dry. Turgor intact.  PSYCHIATRIC: Mood, affect within normal limits. The patient is awake, alert and oriented x 3. Insight, judgment intact.       IMAGING    CT ANGIO CHEST PE W OR WO CONTRAST  Result Date: 10/26/2019 CLINICAL DATA:  Shortness of breath, COVID positive EXAM: CT ANGIOGRAPHY CHEST WITH CONTRAST TECHNIQUE: Multidetector CT imaging of the chest was performed using the standard protocol during bolus administration of intravenous contrast. Multiplanar CT image reconstructions and MIPs were obtained to evaluate the vascular anatomy. CONTRAST:  35mL OMNIPAQUE IOHEXOL 350 MG/ML SOLN COMPARISON:  None. FINDINGS: Cardiovascular: Satisfactory opacification of the pulmonary arteries to the proximal segmental level. No evidence of pulmonary embolism. Normal heart size. No  pericardial effusion. Coronary artery and thoracic aorta atherosclerotic calcification. Mediastinum/Nodes: No mediastinal, hilar, or axillary adenopathy. Thyroid is unremarkable. Esophagus appears normal. Lungs/Pleura: There is multifocal consolidation greatest at the periphery and lung bases. No pleural effusion or pneumothorax. Upper Abdomen: No acute abnormality. Musculoskeletal: No chest wall abnormality. No acute or significant osseous findings. Review of the MIP images confirms the above findings. IMPRESSION: No evidence of acute pulmonary embolism. Bilateral consolidative opacities likely reflecting pneumonia (including COVID-19). Aortic Atherosclerosis (ICD10-I70.0). Electronically Signed   By: Guadlupe Spanish M.D.   On: 10/26/2019 17:03   US Venous Img Lower Bilateral (DVT)  Result Date: 10/26/2019 CLINICAL DATA:  Lower extremity edema for 1 day EXAM: BILATERAL LOWER EXTREMITY VENOUS DOPPLER ULTRASOUND TECHNIQUE: Gray-scale sonography with graded compression, as well as color Doppler and duplex ultrasound were performed to evaluate the lower extremity deep  venous systems from the level of the common femoral vein and including the common femoral, femoral, profunda femoral, popliteal and calf veins including the posterior tibial, peroneal and gastrocnemius veins when visible. The superficial great saphenous vein was also interrogated. Spectral Doppler was utilized to evaluate flow at rest and with distal augmentation maneuvers in the common femoral, femoral and popliteal veins. COMPARISON:  None. FINDINGS: RIGHT LOWER EXTREMITY Common Femoral Vein: No evidence of thrombus. Normal compressibility, respiratory phasicity and response to augmentation. Saphenofemoral Junction: No evidence of thrombus. Normal compressibility and flow on color Doppler imaging. Profunda Femoral Vein: No evidence of thrombus. Normal compressibility and flow on color Doppler imaging. Femoral Vein: No evidence of thrombus. Normal compressibility, respiratory phasicity and response to augmentation. Popliteal Vein: No evidence of thrombus. Normal compressibility, respiratory phasicity and response to augmentation. Calf Veins: Calf posterior tibial and peroneal veins demonstrate intraluminal thrombus appearing occlusive in the posterior tibial vein and nonocclusive in the peroneal vein. Vessels are noncompressible. Very low thrombus burden. LEFT LOWER EXTREMITY Common Femoral Vein: No evidence of thrombus. Normal compressibility, respiratory phasicity and response to augmentation. Saphenofemoral Junction: No evidence of thrombus. Normal compressibility and flow on color Doppler imaging. Profunda Femoral Vein: No evidence of thrombus. Normal compressibility and flow on color Doppler imaging. Femoral Vein: No evidence of thrombus. Normal compressibility, respiratory phasicity and response to augmentation. Popliteal Vein: No evidence of thrombus. Normal compressibility, respiratory phasicity and response to augmentation. Calf Veins: No evidence of thrombus. Normal compressibility and flow on color  Doppler imaging. IMPRESSION: Positive exam for calf region right posterior tibial and peroneal vein DVT. Very low thrombus burden. No propagation into the popliteal or femoral veins. Negative for left lower extremity DVT. Electronically Signed   By: Judie Petit.  Shick M.D.   On: 10/26/2019 11:54   DG Chest Port 1 View  Result Date: 11/03/2019 CLINICAL DATA:  Shortness of breath. EXAM: PORTABLE CHEST 1 VIEW COMPARISON:  October 30, 2019. FINDINGS: Stable cardiomediastinal silhouette. No pneumothorax or pleural effusion is noted. Stable bilateral patchy airspace opacities are noted, right greater than left, consistent with multifocal pneumonia. Bony thorax is unremarkable. IMPRESSION: Stable bilateral lung opacities are noted, right greater than left, consistent with multifocal pneumonia. Electronically Signed   By: Lupita Raider M.D.   On: 11/03/2019 08:19   DG Chest Port 1 View  Result Date: 10/30/2019 CLINICAL DATA:  Shortness of breath. EXAM: PORTABLE CHEST 1 VIEW COMPARISON:  October 28, 2019. FINDINGS: Stable cardiomediastinal silhouette. No pneumothorax or pleural effusion is noted. Stable bilateral patchy airspace opacities are noted consistent with multifocal pneumonia. Bony thorax is unremarkable. IMPRESSION: Stable bilateral multifocal pneumonia. Electronically  Signed   By: Lupita Raider M.D.   On: 10/30/2019 09:02   DG Chest Port 1 View  Result Date: 10/28/2019 CLINICAL DATA:  79 year old male with pneumonia. EXAM: PORTABLE CHEST 1 VIEW COMPARISON:  Chest radiograph dated 10/24/2019 and CT dated 10/26/2019. FINDINGS: Bilateral mid to lower lung field subpleural and streaky densities appear similar or slightly improved since the prior radiograph. No large pleural effusion or pneumothorax. Stable cardiomediastinal silhouette. Atherosclerotic calcification of the aorta. No acute osseous pathology. IMPRESSION: Similar or slightly improved pulmonary opacities. Continued follow-up recommended.  Electronically Signed   By: Elgie Collard M.D.   On: 10/28/2019 20:37   DG Chest Port 1 View  Result Date: 10/24/2019 CLINICAL DATA:  Shortness of breath. EXAM: PORTABLE CHEST 1 VIEW COMPARISON:  October 20, 2019. FINDINGS: Stable cardiomediastinal silhouette. Atherosclerosis of thoracic aorta is noted. No pneumothorax or pleural effusion is noted. Mildly increased bilateral patchy ill-defined airspace opacities are noted consistent with multifocal pneumonia. Bony thorax is unremarkable. IMPRESSION: Aortic atherosclerosis. Mildly increased bilateral patchy airspace opacities are noted consistent with multifocal pneumonia. Electronically Signed   By: Lupita Raider M.D.   On: 10/24/2019 08:08   DG Chest Port 1 View  Result Date: 10/20/2019 CLINICAL DATA:  Shortness of breath, COVID exposure. EXAM: PORTABLE CHEST 1 VIEW COMPARISON:  Chest radiograph dated 02/10/2011. FINDINGS: The heart appears enlarged. Vascular calcifications are seen in the aortic arch. Moderate airspace opacities are seen in the peripheral right mid lung and in the left lower lung. There is no pleural effusion or pneumothorax. The osseous structures are intact. IMPRESSION: Moderate airspace opacities in the peripheral right mid lung and left lower lung may reflect COVID-19 pneumonia. Electronically Signed   By: Romona Curls M.D.   On: 10/20/2019 18:11           ASSESSMENT/PLAN   Acute hypoxemic respiratory failure due to COVID 19 induced pneumonia - Currently increased from 5 to 7L/min [place] - patient is s/p Vecluri and Actemra -CT chest - reviewed by me as above with bilateral multifocal atypical pneumonia - Solumedrol - changing to 40 BID  - stopping colchicine 0.6mg  PO daily today due to aki - Chest phyisotherapy - bed PT - please encourage patient to use IS and flutter valve - I worked closely with patient and he is now able to inhale Vt at  - will hold diuretic- due to mild AKI - Unable to add any nebulized  medication due to COVID precautions -PT/OT already ordered -will continue to follow along with you  - repeat CXR in am 11/03/19 to compare to previous from 10/30/19- relatively unchanged.    DVT   - patient already on anticoagulation   - no signs of bleeding     Critical care provider statement:    Critical care time (minutes):  33   Critical care time was exclusive of:  Separately billable procedures and  treating other patients   Critical care was necessary to treat or prevent imminent or  life-threatening deterioration of the following conditions:  acute hypoxemic respioratory failure, COVID19 induced pneumonitis, multiple comorbid conditions   Critical care was time spent personally by me on the following  activities:  Development of treatment plan with patient or surrogate,  discussions with consultants, evaluation of patient's response to  treatment, examination of patient, obtaining history from patient or  surrogate, ordering and performing treatments and interventions, ordering  and review of laboratory studies and re-evaluation of patient's condition   I  assumed direction of critical care for this patient from another  provider in my specialty: no       Thank you for allowing me to participate in the care of this patient.     Patient/Family are satisfied with care plan and all questions have been answered.  This document was prepared using Dragon voice recognition software and may include unintentional dictation errors.     Vida Rigger, M.D.  Division of Pulmonary & Critical Care Medicine  [facility]

## 2019-11-03 NOTE — Progress Notes (Signed)
Pt arrived to unit at this time. Pt is alert and oriented X4. Breathing is normal, no distress noted. Pt does get [facility] with exertion. Pt is on 15L HFNC at this time and O2 sats are around 88-89%. All other VSS at this time.

## 2019-11-04 LAB — CBC
HCT: 45.8 % (ref 39.0–52.0)
Hemoglobin: 15.5 g/dL (ref 13.0–17.0)
MCH: 30 pg (ref 26.0–34.0)
MCHC: 33.8 g/dL (ref 30.0–36.0)
MCV: 88.6 fL (ref 80.0–100.0)
Platelets: 143 10*3/uL — ABNORMAL LOW (ref 150–400)
RBC: 5.17 MIL/uL (ref 4.22–5.81)
RDW: 14.6 % (ref 11.5–15.5)
WBC: 14.3 10*3/uL — ABNORMAL HIGH (ref 4.0–10.5)
nRBC: 0 % (ref 0.0–0.2)

## 2019-11-04 LAB — GLUCOSE, CAPILLARY
Glucose-Capillary: 199 mg/dL — ABNORMAL HIGH (ref 70–99)
Glucose-Capillary: 259 mg/dL — ABNORMAL HIGH (ref 70–99)
Glucose-Capillary: 228 mg/dL — ABNORMAL HIGH (ref 70–99)
Glucose-Capillary: 319 mg/dL — ABNORMAL HIGH (ref 70–99)

## 2019-11-04 LAB — BASIC METABOLIC PANEL
Anion gap: 9 (ref 5–15)
BUN: 43 mg/dL — ABNORMAL HIGH (ref 8–23)
CO2: 23 mmol/L (ref 22–32)
Calcium: 8.1 mg/dL — ABNORMAL LOW (ref 8.9–10.3)
Chloride: 104 mmol/L (ref 98–111)
Creatinine, Ser: 1.08 mg/dL (ref 0.61–1.24)
GFR calc Af Amer: >60 mL/min (ref >60)
GFR calc non Af Amer: >60 mL/min (ref >60)
Glucose, Bld: 203 mg/dL — ABNORMAL HIGH (ref 70–99)
Potassium: 4.9 mmol/L (ref 3.5–5.1)
Sodium: 136 mmol/L (ref 135–145)

## 2019-11-04 MED ORDER — INSULIN DETEMIR 100 UNIT/ML ~~LOC~~ SOLN
10.0000 [IU] | Freq: Two times a day (BID) | SUBCUTANEOUS | Status: DC
  Administered 2019-11-04 – 2019-11-05 (×2): 10 [IU] via SUBCUTANEOUS
  Filled 2019-11-04 (×3): qty 0.1

## 2019-11-04 NOTE — Progress Notes (Signed)
Occupational Therapy Treatment Patient Details Name: Walter Baxter MRN: [id number] DOB: 30-Apr-1941 Today's Date: 11/04/2019    History of present illness Pt is 79 y/o M who presented to ER secondary to SOB, generalized weakness; admitted for management of acute respiratory failure related to COVID-19 PNA. Note: pt t/f to IC stepdown d/t increased O2 needs.   OT comments  Pt seen for OT tx this date to f/u re: tolerance for self care activities, ADL mobility. Pt tolerates t/f to/from [facility] moderately well (see precaution section for details re: O2 sats). Requires MIN A for ADL transfers and MOD A for toileting-peri care and clothing mgt. Pt left in chair with with legs elevated with spO2 stable for this pt on 12L HFNC. SNF continues to be most reasonable d/c destination given pt's low fxl activity tolerance.    Follow Up Recommendations  SNF    Equipment Recommendations  Other (comment)(defer)    Recommendations for Other Services      Precautions / Restrictions Precautions Precautions: Fall;Other (comment) Precaution Comments: Verbal, Per Dr Lanney Gins, desat with activity permissable to 75% Restrictions Weight Bearing Restrictions: No Other Position/Activity Restrictions: watch SpO2, at 12L HFNC initially while seated up in chair, increased to 15L HFNC with t/f to [facility] and during toileting. Pt de-satted to 76% with transfer very briefly, but gradually rebounded over 2-3 minutes seated RB to 88%-90% Pt O2 waxed and waned throughout seated BM with RR reaching 40 breaths per minute at peak and able to come to 24 breaths per minute before engaging in t/f back to chair. Seated in chair at end of session, turned back down to 12L HFNC, Sats 90-92% as long as pt not speaking, some desat to ~85-86% when speaking.       Mobility Bed Mobility               General bed mobility comments: Pt recieved sitting up in chair  Transfers Overall transfer level: Needs assistance   Transfers:  Stand Pivot Transfers Sit to Stand: Min assist              Balance Overall balance assessment: Needs assistance Sitting-balance support: Feet supported;No upper extremity supported Sitting balance-Leahy Scale: Good     Standing balance support: Bilateral upper extremity supported Standing balance-Leahy Scale: Fair                             ADL either performed or assessed with clinical judgement   ADL Overall ADL's : Needs assistance/impaired                         Toilet Transfer: Minimal assistance;Stand-pivot;BSC   Toileting- Clothing Manipulation and Hygiene: Moderate assistance;Sit to/from stand               Vision Patient Visual Report: No change from baseline     Perception     Praxis      Cognition Arousal/Alertness: Awake/alert Behavior During Therapy: WFL for tasks assessed/performed Overall Cognitive Status: Within Functional Limits for tasks assessed                                 General Comments: A&O        Exercises     Shoulder Instructions       General Comments      Pertinent Vitals/ Pain  Pain Assessment: No/denies pain  Home Living                                          Prior Functioning/Environment              Frequency  Min 2X/week        Progress Toward Goals  OT Goals(current goals can now be found in the care plan section)  Progress towards OT goals: Progressing toward goals  Acute Rehab OT Goals Patient Stated Goal: to return home OT Goal Formulation: With patient Time For Goal Achievement: 11/08/19 Potential to Achieve Goals: Good  Plan Discharge plan remains appropriate;Frequency remains appropriate    Co-evaluation                 AM-PAC OT "6 Clicks" Daily Activity     Outcome Measure   Help from another person eating meals?: None Help from another person taking care of personal grooming?: A Little Help from another  person toileting, which includes using toliet, bedpan, or urinal?: A Little Help from another person bathing (including washing, rinsing, drying)?: A Lot Help from another person to put on and taking off regular upper body clothing?: A Little Help from another person to put on and taking off regular lower body clothing?: A Lot 6 Click Score: 17    End of Session Equipment Utilized During Treatment: Rolling walker;Oxygen  OT Visit Diagnosis: Muscle weakness (generalized) (M62.81);Other abnormalities of gait and mobility (R26.89)   Activity Tolerance Patient tolerated treatment well;Other (comment)   Patient Left in chair;with call bell/phone within reach   Nurse Communication Other (comment)(Notified MD of pt performance, notified CNA that pt wanting bed changed and to get back in bed after dinner)        Time: 0981-1914 OT Time Calculation (min): 38 min  Charges: OT General Charges $OT Visit: 1 Visit OT Treatments $Self Care/Home Management : 23-37 mins $Therapeutic Activity: 8-22 mins  Rejeana Brock, MS, OTR/L ascom [phone] 11/04/19, 5:12 PM

## 2019-11-04 NOTE — Progress Notes (Signed)
PROGRESS NOTE    Walter Baxter  IWO:[id number] DOB: November 28, 1940 DOA: 10/20/2019 PCP: Lupita Raider, MD   Brief Narrative:  Walter Baxter a 79 y.o.malewith medical history significant fordiet-controlled diabetes, hypertension, and hyperlipidemia who presents to the ED for evaluation of shortness of breath.Patient reports new sudden onset of significant generalized weakness, fatigue, and shortness of breath beginning the night before admission. He has had associated intermittent fevers, chills, loss of taste/smell, and rare nonproductive cough. Admitted for COVID-19 pneumonia, treated with remdesivir, Decadron and Actemra.  Continue to require O2 supplement Wife was also admitted for COVID-19 infection.  Subjective: Feeling better, less sob, but not close to baseline.  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  Completed a course of remdesivir.  He was given Actemra X I. Lower extremity Doppler was positive for thrombus in right posterior tibial and peroneal vein.  D-dimer improving with CRP has been normalized.  CTA was negative for PE. Seems improving, now saturating in low 90s on 6 L at rest -Switched Decadron with Solu-Medrol IV 40 mg twice dail we will continue. -Pulmonary started him on Bactrim for PCP prophylaxis. -Colchicine was added by pulmonary yesterday. - started on Mucomyst. -Continue zinc and vitamin C -Continue incentive spirometry. -Might have to go home with home oxygen. -PT/OT recommending SNF placement but patient and wife decided to go back home with home health services. -Pulmonary following-appreciate their recommendations.-As patient continued to require high levels of oxygen.  He is high risk for deterioration and  appears to have poor prognosis. No nebs as patient has Covid precautions Chest physiotherapy  DVT- on xarelto   Leukocytosis/UTI.   Leukocytosis improving  Partly can be due to steroid.   CXR- Stable UA -With leukocytes and bacteria- no urinary symptoms. Urine culture is growing more than 100,000 colonies of E. Coli-with good sensitivity. -On bactrim  -D/C Bactrim. PCT. <0.10>>0.16 -Check MRCA swab-negative  Abdominal Pain.Very non specific. No red flag currently. Mostly occurring after bactrim- we will dc bactrim. -Will observe.  AKI.  Improved Encourage PO hydration. Monitor renal function. Avoid nephrotoxic. Continue to monitor  Hypertension.  Blood pressure was within goal. -Continue to monitor. -Continue Lopressor if heart rate within normal limit. -Can use hydralazine as needed for systolic above 160.  Type 2 diabetes mellitus.  Well-controlled with A1c of 6.1 And had some elevated blood sugar levels most likely secondary to steroid. CBG mostly in 200s now. -Increase Levemir to 10 units BID  -NovoLog 6 units with meals.TID -Continue to monitor and SSI.  Acute urinary retention.  Unclear etiology, most likely secondary to BPH. Has been resolved.  Patient is urinating without any difficulty. -Continue Flomax.  Objective: Vitals:   11/04/19 0400 11/04/19 0422 11/04/19 0500 11/04/19 0600  BP:  129/73 119/70 129/71  Pulse: (!) 58 61 67 68  Resp: 20 17 20 20   Temp:  97.6 F (36.4 C)    TempSrc:  Oral    SpO2: 95% 94% 93% 94%  Weight:      Height:        Intake/Output Summary (Last 24 hours) at 11/04/2019 0833 Last data filed at 11/04/2019 0000 Gross per 24 hour  Intake 63 ml  Output 375 ml  Net -312 ml   Filed Weights   10/20/19 1724 10/20/19  2331 10/22/19 0532  Weight: 105.2 kg 99.9 kg 98.7 kg    Examination:  General exam:  ill-appearing, pleasant appears calm and comfortable. Respiratory system: Mild rhonchi, no rales, no  wheezing Cardiovascular system: S1 & S2 heard, RRR. Nomurmurs, rubs, gallops or clicks. Gastrointestinal system: Soft, nontender, nondistended, bowel sounds positive. Central nervous system: Alert and oriented.  Grossly intact  Extremities: No edema Skin: Warm dry Psychiatry: Judgement and insight appear normal in current setting.    DVT prophylaxis: Xarelto Code Status: Full Family Communication: None at bedside Disposition Plan: Pending improvement, most likely next couple of days.  He is still requiring high amount of oxygen.  OT/PT  recommended SNF.  Patient is high risk for deterioration with poor prognosis as continue to require high levels of oxygen and worsening hypoxia.  Consultants:   Pulmonary  Procedures:  Antimicrobials:  Bactrim  Data Reviewed: I have personally reviewed following labs and imaging studies  CBC: Recent Labs  Lab 10/30/19 0352 11/01/19 0548 11/02/19 0424 11/03/19 0522 11/04/19 0641  WBC 21.2* 18.0* 16.1* 18.5* 14.3*  HGB 14.4 14.4 14.9 15.3 15.5  HCT 43.9 43.9 44.6 45.7 45.8  MCV 90.0 90.0 88.7 88.9 88.6  PLT 200 152 146* 159 361*   Basic Metabolic Panel: Recent Labs  Lab 10/30/19 0352 11/01/19 0548 11/02/19 0424 11/03/19 0522 11/04/19 0641  NA 138 136 138 135 136  K 4.9 5.1 4.9 4.8 4.9  CL 107 104 102 100 104  CO2 22 24 28 28 23   GLUCOSE 240* 228* 210* 209* 203*  BUN 36* 29* 39* 51* 43*  CREATININE 1.02 0.97 1.16 1.26* 1.08  CALCIUM 7.8* 7.7* 7.9* 8.0* 8.1*   GFR: Estimated Creatinine Clearance: 65.2 mL/min (by C-G formula based on SCr of 1.08 mg/dL). Liver Function Tests: No results for input(s): AST, ALT, ALKPHOS, BILITOT, PROT, ALBUMIN in the last 168 hours. No results for input(s): LIPASE, AMYLASE in the last 168 hours. No results for input(s): AMMONIA in the last 168 hours. Coagulation Profile: No results for input(s): INR, PROTIME in the last 168 hours. Cardiac Enzymes: No results for input(s): CKTOTAL, CKMB,  CKMBINDEX, TROPONINI in the last 168 hours. BNP (last 3 results) No results for input(s): PROBNP in the last 8760 hours. HbA1C: No results for input(s): HGBA1C in the last 72 hours. CBG: Recent Labs  Lab 11/03/19 0732 11/03/19 1137 11/03/19 1629 11/03/19 2141 11/04/19 0749  GLUCAP 225* 227* 230* 154* 199*   Lipid Profile: No results for input(s): CHOL, HDL, LDLCALC, TRIG, CHOLHDL, LDLDIRECT in the last 72 hours. Thyroid Function Tests: No results for input(s): TSH, T4TOTAL, FREET4, T3FREE, THYROIDAB in the last 72 hours. Anemia Panel: No results for input(s): VITAMINB12, FOLATE, FERRITIN, TIBC, IRON, RETICCTPCT in the last 72 hours. Sepsis Labs: Recent Labs  Lab 10/30/19 0352 10/31/19 0311 11/01/19 0548  PROCALCITON <0.10 0.16 <0.10    Recent Results (from the past 240 hour(s))  Urine Culture     Status: Abnormal   Collection Time: 10/30/19 11:14 AM   Specimen: Urine, Random  Result Value Ref Range Status   Specimen Description   Final    URINE, RANDOM Performed at [facility], [address], [place], [place] [address]    Special Requests   Final    NONE Performed at [facility], [address] [place], [place] [address]    Culture >=100,000 COLONIES/mL ESCHERICHIA COLI (A)  Final   Report Status 11/01/2019 FINAL  Final   Organism ID, Bacteria ESCHERICHIA COLI (  A)  Final      Susceptibility   Escherichia coli - MIC*    AMPICILLIN >=32 RESISTANT Resistant     CEFAZOLIN <=4 SENSITIVE Sensitive     CEFTRIAXONE <=0.25 SENSITIVE Sensitive     CIPROFLOXACIN >=4 RESISTANT Resistant     GENTAMICIN <=1 SENSITIVE Sensitive     IMIPENEM <=0.25 SENSITIVE Sensitive     NITROFURANTOIN <=16 SENSITIVE Sensitive     TRIMETH/SULFA <=20 SENSITIVE Sensitive     AMPICILLIN/SULBACTAM >=32 RESISTANT Resistant     PIP/TAZO <=4 SENSITIVE Sensitive     * >=100,000 COLONIES/mL ESCHERICHIA COLI  MRSA PCR Screening     Status: None   Collection Time: 10/30/19  11:30 AM   Specimen: Nasal Mucosa; Nasopharyngeal  Result Value Ref Range Status   MRSA by PCR NEGATIVE NEGATIVE Final    Comment:        The GeneXpert MRSA Assay (FDA approved for NASAL specimens only), is one component of a comprehensive MRSA colonization surveillance program. It is not intended to diagnose MRSA infection nor to guide or monitor treatment for MRSA infections. Performed at [facility], [address], [place], [place] [address]      Radiology Studies: [facility] [place] 1 View  Result Date: 11/03/2019 CLINICAL DATA:  Shortness of breath. EXAM: PORTABLE CHEST 1 VIEW COMPARISON:  October 30, 2019. FINDINGS: Stable cardiomediastinal silhouette. No pneumothorax or pleural effusion is noted. Stable bilateral patchy airspace opacities are noted, right greater than left, consistent with multifocal pneumonia. Bony thorax is unremarkable. IMPRESSION: Stable bilateral lung opacities are noted, right greater than left, consistent with multifocal pneumonia. Electronically Signed   By: Lupita Raider M.D.   On: 11/03/2019 08:19    Scheduled Meds: . chlorhexidine  15 mL Mouth Rinse BID  . Chlorhexidine Gluconate Cloth  6 each Topical Daily  . dextromethorphan-guaiFENesin  1 tablet Oral BID  . famotidine  20 mg Oral BID  . insulin aspart  0-9 Units Subcutaneous TID WC  . insulin aspart  6 Units Subcutaneous TID WC  . insulin detemir  8 Units Subcutaneous BID  . Ipratropium-Albuterol  1 puff Inhalation Q6H  . mouth rinse  15 mL Mouth Rinse q12n4p  . methylPREDNISolone (SOLU-MEDROL) injection  40 mg Intravenous Q12H  . Ensure Max Protein  11 oz Oral BID BM  . rivaroxaban  15 mg Oral BID AC   Followed by  . [START ON 11/16/2019] rivaroxaban  20 mg Oral Q supper  . sodium chloride flush  3 mL Intravenous Q12H  . sucralfate  1 g Oral TID WC & HS  . sulfamethoxazole-trimethoprim  1 tablet Oral Q12H  . tamsulosin  0.4 mg Oral Daily   Continuous Infusions: . sodium  chloride Stopped (10/24/19 1900)  . sodium chloride       LOS: 15 days   Time spent: 45 minutes with more than 50% COC  Lynn Ito, MD [facility]  If 7PM-7AM, please contact night-coverage 11/04/2019, 8:33 AM   This record has been created using Dragon voice recognition software. Errors have been sought and corrected,but may not always be located. Such creation errors do not reflect on the standard of care. Patient ID: [id number] TUMAN, male   DOB: 1941-01-01, 79 y.o.   MRN: [id number]

## 2019-11-04 NOTE — Progress Notes (Signed)
Assisted pt. Up to chair with minimal assistance. O2 on edge of bed 92% - increased oxygen to 10L. O2 sitting up in chair remains at 85%- increased o2 to 12L ..the patient is  Sating 87%.

## 2019-11-04 NOTE — Progress Notes (Signed)
CRITICAL CARE PROGRESS NOTE          Date: 11/04/2019,   MRN# [id number] Walter Baxter 05-09-1941     AdmissionWeight: 105.2 kg                 CurrentWeight: 98.7 kg      CHIEF COMPLAINT:   Increased O2 requirement with COVID 19 pneumonitis   HISTORY OF PRESENT ILLNESS   Patient is sad today , he is crying during interview.  He shares that his wife who is home sick with COVID19 has no one to check on her because rest of family is so afraid of catching disease.    I have offered for [place] to call [facility] dept to do wellness check and he is agreeable and appreciative , we will also place CM consult to help offer additional resources.      PAST MEDICAL HISTORY   Past Medical History:  Diagnosis Date  . Allergic rhinitis   . Angio-edema   . Chronic bronchitis (HCC)   . Erectile dysfunction   . Essential hypertension   . Hyperlipidemia   . Obesity   . Type 2 diabetes mellitus (HCC)   [id number]   SURGICAL HISTORY   Past Surgical History:  Procedure Laterality Date  . ADENOIDECTOMY    . TONSILLECTOMY       FAMILY HISTORY   Family History  Problem Relation Age of Onset  . Lupus Sister      SOCIAL HISTORY   Social History   Tobacco Use  . Smoking status: Never Smoker  . Smokeless tobacco: Never Used  Substance Use Topics  . Alcohol use: Not Currently  . Drug use: Never     MEDICATIONS    Home Medication:    Current Medication:  Current Facility-Administered Medications:  .  0.9 %  sodium chloride infusion, , Intravenous, PRN, Arnetha Courser, MD, Stopped at 10/24/19 1900 .  acetaminophen (TYLENOL) tablet 650 mg, 650 mg, Oral, Q6H PRN, Arnetha Courser, MD, 650 mg at 11/01/19 1812 .  chlorhexidine (PERIDEX) 0.12 % solution 15 mL, 15 mL, Mouth Rinse, BID, Arnetha Courser, MD, 15 mL at 11/04/19 0843 .  Chlorhexidine Gluconate Cloth 2 % PADS 6 each, 6 each, Topical, Daily, Arnetha Courser, MD, 6 each at 11/04/19 0847 .   dextromethorphan-guaiFENesin (MUCINEX DM) 30-600 MG per 12 hr tablet 1 tablet, 1 tablet, Oral, BID, Arnetha Courser, MD, 1 tablet at 11/04/19 0844 .  famotidine (PEPCID) tablet 20 mg, 20 mg, Oral, BID, Arnetha Courser, MD, 20 mg at 11/04/19 0843 .  guaiFENesin-dextromethorphan (ROBITUSSIN DM) 100-10 MG/5ML syrup 10 mL, 10 mL, Oral, Q4H PRN, Arnetha Courser, MD, 10 mL at 10/29/19 0831 .  insulin aspart (novoLOG) injection 0-9 Units, 0-9 Units, Subcutaneous, TID WC, Arnetha Courser, MD, 2 Units at 11/04/19 0846 .  insulin aspart (novoLOG) injection 6 Units, 6 Units, Subcutaneous, TID WC, Arnetha Courser, MD, 6 Units at 11/04/19 0846 .  insulin detemir (LEVEMIR) injection 8 Units, 8 Units, Subcutaneous, BID, Arnetha Courser, MD, 8 Units at 11/03/19 2145 .  Ipratropium-Albuterol (COMBIVENT) respimat 1 puff, 1 puff, Inhalation, Q6H, Arnetha Courser, MD, 1 puff at 11/04/19 0208 .  MEDLINE mouth rinse, 15 mL, Mouth Rinse, q12n4p, [name], Sumayya, MD .  methylPREDNISolone sodium succinate (SOLU-MEDROL) 40 mg/mL injection 40 mg, 40 mg, Intravenous, Q12H, Arnetha Courser, MD, 40 mg at 11/04/19 0208 .  ondansetron (ZOFRAN) tablet 4 mg, 4 mg, Oral, Q6H PRN **OR** ondansetron (ZOFRAN) injection 4 mg, 4 mg, Intravenous,  Q6H PRN, Lorella Nimrod, MD .  protein supplement (ENSURE MAX) liquid, 11 oz, Oral, BID BM, Lorella Nimrod, MD, 11 oz at 11/04/19 0847 .  Rivaroxaban (XARELTO) tablet 15 mg, 15 mg, Oral, BID AC, 15 mg at 11/04/19 0843 **FOLLOWED BY** [START ON 11/16/2019] rivaroxaban (XARELTO) tablet 20 mg, 20 mg, Oral, Q supper, [name], Sumayya, MD .  sodium chloride 0.9 % bolus 500 mL, 500 mL, Intravenous, Once, [name], Sumayya, MD .  sodium chloride flush (NS) 0.9 % injection 3 mL, 3 mL, Intravenous, Q12H, Lorella Nimrod, MD, 3 mL at 11/04/19 0845 .  sucralfate (CARAFATE) 1 GM/10ML suspension 1 g, 1 g, Oral, TID WC & HS, Lorella Nimrod, MD, 1 g at 11/04/19 0843 .  sulfamethoxazole-trimethoprim (BACTRIM) 400-80 MG per tablet 1 tablet, 1  tablet, Oral, Q12H, Ottie Glazier, MD, 1 tablet at 11/04/19 0844 .  tamsulosin (FLOMAX) capsule 0.4 mg, 0.4 mg, Oral, Daily, Lorella Nimrod, MD, 0.4 mg at 11/04/19 0843    ALLERGIES   Other     REVIEW OF SYSTEMS    Review of Systems:  Gen:  Denies  fever, sweats, chills weigh loss  HEENT: Denies blurred vision, double vision, ear pain, eye pain, hearing loss, nose bleeds, sore throat Cardiac:  No dizziness, chest pain or heaviness, chest tightness,edema Resp:   Reports minimal cough,sputum porduction, but significant shortness of breath, however without wheezing, and no hemoptysis,  Gi: Denies swallowing difficulty, stomach pain, nausea or vomiting, diarrhea, constipation, bowel incontinence Gu:  Denies bladder incontinence, burning urine Ext:   Denies Joint pain, stiffness or swelling Skin: Denies  skin rash, easy bruising or bleeding or hives Endoc:  Denies polyuria, polydipsia , polyphagia or weight change Psych:   Denies depression, insomnia or hallucinations   Other:  All other systems negative   VS: BP 128/73   Pulse 71   Temp 97.6 F (36.4 C) (Oral)   Resp (!) 21   Ht 5\' 8"  (1.727 m)   Wt 98.7 kg   SpO2 97%   BMI 33.07 kg/m      PHYSICAL EXAM    GENERAL:NAD, no fevers, chills, no weakness no fatigue HEAD: Normocephalic, atraumatic.  EYES: Pupils equal, round, reactive to light. Extraocular muscles intact. No scleral icterus.  MOUTH: Moist mucosal membrane. Dentition intact. No abscess noted.  EAR, NOSE, THROAT: Clear without exudates. No external lesions.  NECK: Supple. No thyromegaly. No nodules. No JVD.  PULMONARY: rhochi improved CARDIOVASCULAR: S1 and S2. Regular rate and rhythm. No murmurs, rubs, or gallops. No edema. Pedal pulses 2+ bilaterally.  GASTROINTESTINAL: Soft, nontender, nondistended. No masses. Positive bowel sounds. No hepatosplenomegaly.  MUSCULOSKELETAL: No swelling, clubbing, or edema. Range of motion full in all extremities.    NEUROLOGIC: Cranial nerves II through XII are intact. No gross focal neurological deficits. Sensation intact. Reflexes intact.  SKIN: No ulceration, lesions, rashes, or cyanosis. Skin warm and dry. Turgor intact.  PSYCHIATRIC: Mood, affect within normal limits. The patient is awake, alert and oriented x 3. Insight, judgment intact.       IMAGING    CT ANGIO CHEST PE W OR WO CONTRAST  Result Date: 10/26/2019 CLINICAL DATA:  Shortness of breath, COVID positive EXAM: CT ANGIOGRAPHY CHEST WITH CONTRAST TECHNIQUE: Multidetector CT imaging of the chest was performed using the standard protocol during bolus administration of intravenous contrast. Multiplanar CT image reconstructions and MIPs were obtained to evaluate the vascular anatomy. CONTRAST:  35mL OMNIPAQUE IOHEXOL 350 MG/ML SOLN COMPARISON:  None. FINDINGS: Cardiovascular: Satisfactory opacification of  the pulmonary arteries to the proximal segmental level. No evidence of pulmonary embolism. Normal heart size. No pericardial effusion. Coronary artery and thoracic aorta atherosclerotic calcification. Mediastinum/Nodes: No mediastinal, hilar, or axillary adenopathy. Thyroid is unremarkable. Esophagus appears normal. Lungs/Pleura: There is multifocal consolidation greatest at the periphery and lung bases. No pleural effusion or pneumothorax. Upper Abdomen: No acute abnormality. Musculoskeletal: No chest wall abnormality. No acute or significant osseous findings. Review of the MIP images confirms the above findings. IMPRESSION: No evidence of acute pulmonary embolism. Bilateral consolidative opacities likely reflecting pneumonia (including COVID-19). Aortic Atherosclerosis (ICD10-I70.0). Electronically Signed   By: Guadlupe Spanish M.D.   On: 10/26/2019 17:03   US Venous Img Lower Bilateral (DVT)  Result Date: 10/26/2019 CLINICAL DATA:  Lower extremity edema for 1 day EXAM: BILATERAL LOWER EXTREMITY VENOUS DOPPLER ULTRASOUND TECHNIQUE: Gray-scale  sonography with graded compression, as well as color Doppler and duplex ultrasound were performed to evaluate the lower extremity deep venous systems from the level of the common femoral vein and including the common femoral, femoral, profunda femoral, popliteal and calf veins including the posterior tibial, peroneal and gastrocnemius veins when visible. The superficial great saphenous vein was also interrogated. Spectral Doppler was utilized to evaluate flow at rest and with distal augmentation maneuvers in the common femoral, femoral and popliteal veins. COMPARISON:  None. FINDINGS: RIGHT LOWER EXTREMITY Common Femoral Vein: No evidence of thrombus. Normal compressibility, respiratory phasicity and response to augmentation. Saphenofemoral Junction: No evidence of thrombus. Normal compressibility and flow on color Doppler imaging. Profunda Femoral Vein: No evidence of thrombus. Normal compressibility and flow on color Doppler imaging. Femoral Vein: No evidence of thrombus. Normal compressibility, respiratory phasicity and response to augmentation. Popliteal Vein: No evidence of thrombus. Normal compressibility, respiratory phasicity and response to augmentation. Calf Veins: Calf posterior tibial and peroneal veins demonstrate intraluminal thrombus appearing occlusive in the posterior tibial vein and nonocclusive in the peroneal vein. Vessels are noncompressible. Very low thrombus burden. LEFT LOWER EXTREMITY Common Femoral Vein: No evidence of thrombus. Normal compressibility, respiratory phasicity and response to augmentation. Saphenofemoral Junction: No evidence of thrombus. Normal compressibility and flow on color Doppler imaging. Profunda Femoral Vein: No evidence of thrombus. Normal compressibility and flow on color Doppler imaging. Femoral Vein: No evidence of thrombus. Normal compressibility, respiratory phasicity and response to augmentation. Popliteal Vein: No evidence of thrombus. Normal compressibility,  respiratory phasicity and response to augmentation. Calf Veins: No evidence of thrombus. Normal compressibility and flow on color Doppler imaging. IMPRESSION: Positive exam for calf region right posterior tibial and peroneal vein DVT. Very low thrombus burden. No propagation into the popliteal or femoral veins. Negative for left lower extremity DVT. Electronically Signed   By: Judie Petit.  Shick M.D.   On: 10/26/2019 11:54   DG Chest Port 1 View  Result Date: 11/03/2019 CLINICAL DATA:  Shortness of breath. EXAM: PORTABLE CHEST 1 VIEW COMPARISON:  October 30, 2019. FINDINGS: Stable cardiomediastinal silhouette. No pneumothorax or pleural effusion is noted. Stable bilateral patchy airspace opacities are noted, right greater than left, consistent with multifocal pneumonia. Bony thorax is unremarkable. IMPRESSION: Stable bilateral lung opacities are noted, right greater than left, consistent with multifocal pneumonia. Electronically Signed   By: Lupita Raider M.D.   On: 11/03/2019 08:19   DG Chest Port 1 View  Result Date: 10/30/2019 CLINICAL DATA:  Shortness of breath. EXAM: PORTABLE CHEST 1 VIEW COMPARISON:  October 28, 2019. FINDINGS: Stable cardiomediastinal silhouette. No pneumothorax or pleural effusion is noted. Stable bilateral patchy airspace  opacities are noted consistent with multifocal pneumonia. Bony thorax is unremarkable. IMPRESSION: Stable bilateral multifocal pneumonia. Electronically Signed   By: Lupita Raider M.D.   On: 10/30/2019 09:02   DG Chest Port 1 View  Result Date: 10/28/2019 CLINICAL DATA:  79 year old male with pneumonia. EXAM: PORTABLE CHEST 1 VIEW COMPARISON:  Chest radiograph dated 10/24/2019 and CT dated 10/26/2019. FINDINGS: Bilateral mid to lower lung field subpleural and streaky densities appear similar or slightly improved since the prior radiograph. No large pleural effusion or pneumothorax. Stable cardiomediastinal silhouette. Atherosclerotic calcification of the aorta. No  acute osseous pathology. IMPRESSION: Similar or slightly improved pulmonary opacities. Continued follow-up recommended. Electronically Signed   By: Elgie Collard M.D.   On: 10/28/2019 20:37   DG Chest Port 1 View  Result Date: 10/24/2019 CLINICAL DATA:  Shortness of breath. EXAM: PORTABLE CHEST 1 VIEW COMPARISON:  October 20, 2019. FINDINGS: Stable cardiomediastinal silhouette. Atherosclerosis of thoracic aorta is noted. No pneumothorax or pleural effusion is noted. Mildly increased bilateral patchy ill-defined airspace opacities are noted consistent with multifocal pneumonia. Bony thorax is unremarkable. IMPRESSION: Aortic atherosclerosis. Mildly increased bilateral patchy airspace opacities are noted consistent with multifocal pneumonia. Electronically Signed   By: Lupita Raider M.D.   On: 10/24/2019 08:08   DG Chest Port 1 View  Result Date: 10/20/2019 CLINICAL DATA:  Shortness of breath, COVID exposure. EXAM: PORTABLE CHEST 1 VIEW COMPARISON:  Chest radiograph dated 02/10/2011. FINDINGS: The heart appears enlarged. Vascular calcifications are seen in the aortic arch. Moderate airspace opacities are seen in the peripheral right mid lung and in the left lower lung. There is no pleural effusion or pneumothorax. The osseous structures are intact. IMPRESSION: Moderate airspace opacities in the peripheral right mid lung and left lower lung may reflect COVID-19 pneumonia. Electronically Signed   By: Romona Curls M.D.   On: 10/20/2019 18:11           ASSESSMENT/PLAN   Acute hypoxemic respiratory failure due to COVID 19 induced pneumonia - Currently increased from 5 to 7L/min [place] - patient is s/p Vecluri and Actemra -CT chest - reviewed by me as above with bilateral multifocal atypical pneumonia - Solumedrol - changing to 40 BID  - AKI resolved - Chest phyisotherapy - bed PT - please encourage patient to use IS and flutter valve - I worked closely with patient and he is now able to inhale  Vt at  - will hold diuretic- due to mild AKI - Unable to add any nebulized medication due to COVID precautions -PT/OT already ordered -will continue to follow along with you  - repeat CXR in am 11/03/19 to compare to previous from 10/30/19- relatively unchanged.    DVT   - patient already on anticoagulation   - no signs of bleeding     Critical care provider statement:    Critical care time (minutes):  33   Critical care time was exclusive of:  Separately billable procedures and  treating other patients   Critical care was necessary to treat or prevent imminent or  life-threatening deterioration of the following conditions:  acute hypoxemic respioratory failure, COVID19 induced pneumonitis, multiple comorbid conditions   Critical care was time spent personally by me on the following  activities:  Development of treatment plan with patient or surrogate,  discussions with consultants, evaluation of patient's response to  treatment, examination of patient, obtaining history from patient or  surrogate, ordering and performing treatments and interventions, ordering  and review of  laboratory studies and re-evaluation of patient's condition   I assumed direction of critical care for this patient from another  provider in my specialty: no       Thank you for allowing me to participate in the care of this patient.     Patient/Family are satisfied with care plan and all questions have been answered.  This document was prepared using Dragon voice recognition software and may include unintentional dictation errors.     Vida Rigger, M.D.  Division of Pulmonary & Critical Care Medicine  [facility]

## 2019-11-05 ENCOUNTER — Encounter (HOSPITAL_COMMUNITY): Payer: Self-pay | Admitting: Internal Medicine

## 2019-11-05 ENCOUNTER — Inpatient Hospital Stay (HOSPITAL_COMMUNITY)
Admission: AD | Admit: 2019-11-05 | Discharge: 2019-11-09 | DRG: 177 | Disposition: A | Discharge status: Home Health | Payer: Medicare Other | Source: Other Acute Inpatient Hospital | Attending: Internal Medicine | Admitting: Internal Medicine

## 2019-11-05 ENCOUNTER — Inpatient Hospital Stay: Payer: Medicare Other

## 2019-11-05 ENCOUNTER — Other Ambulatory Visit: Payer: Self-pay

## 2019-11-05 DIAGNOSIS — B9620 Unspecified Escherichia coli [E. coli] as the cause of diseases classified elsewhere: Secondary | ICD-10-CM | POA: Diagnosis present

## 2019-11-05 DIAGNOSIS — J1282 Pneumonia due to coronavirus disease 2019: Secondary | ICD-10-CM | POA: Diagnosis present

## 2019-11-05 DIAGNOSIS — T380X5A Adverse effect of glucocorticoids and synthetic analogues, initial encounter: Secondary | ICD-10-CM | POA: Diagnosis present

## 2019-11-05 DIAGNOSIS — E1122 Type 2 diabetes mellitus with diabetic chronic kidney disease: Secondary | ICD-10-CM | POA: Diagnosis present

## 2019-11-05 DIAGNOSIS — I5031 Acute diastolic (congestive) heart failure: Secondary | ICD-10-CM | POA: Clinically undetermined

## 2019-11-05 DIAGNOSIS — I071 Rheumatic tricuspid insufficiency: Secondary | ICD-10-CM | POA: Diagnosis present

## 2019-11-05 DIAGNOSIS — I5041 Acute combined systolic (congestive) and diastolic (congestive) heart failure: Secondary | ICD-10-CM | POA: Diagnosis present

## 2019-11-05 DIAGNOSIS — E669 Obesity, unspecified: Secondary | ICD-10-CM | POA: Diagnosis present

## 2019-11-05 DIAGNOSIS — I82441 Acute embolism and thrombosis of right tibial vein: Secondary | ICD-10-CM | POA: Diagnosis present

## 2019-11-05 DIAGNOSIS — J9601 Acute respiratory failure with hypoxia: Secondary | ICD-10-CM | POA: Diagnosis present

## 2019-11-05 DIAGNOSIS — Z6830 Body mass index (BMI) 30.0-30.9, adult: Secondary | ICD-10-CM | POA: Diagnosis not present

## 2019-11-05 DIAGNOSIS — R0602 Shortness of breath: Secondary | ICD-10-CM | POA: Diagnosis present

## 2019-11-05 DIAGNOSIS — N182 Chronic kidney disease, stage 2 (mild): Secondary | ICD-10-CM | POA: Diagnosis present

## 2019-11-05 DIAGNOSIS — N390 Urinary tract infection, site not specified: Secondary | ICD-10-CM | POA: Diagnosis present

## 2019-11-05 DIAGNOSIS — I82451 Acute embolism and thrombosis of right peroneal vein: Secondary | ICD-10-CM | POA: Diagnosis present

## 2019-11-05 DIAGNOSIS — I5040 Unspecified combined systolic (congestive) and diastolic (congestive) heart failure: Secondary | ICD-10-CM | POA: Diagnosis not present

## 2019-11-05 DIAGNOSIS — E1165 Type 2 diabetes mellitus with hyperglycemia: Secondary | ICD-10-CM | POA: Diagnosis present

## 2019-11-05 DIAGNOSIS — E119 Type 2 diabetes mellitus without complications: Secondary | ICD-10-CM

## 2019-11-05 DIAGNOSIS — I824Y1 Acute embolism and thrombosis of unspecified deep veins of right proximal lower extremity: Secondary | ICD-10-CM | POA: Diagnosis not present

## 2019-11-05 DIAGNOSIS — U071 COVID-19: Principal | ICD-10-CM | POA: Diagnosis present

## 2019-11-05 DIAGNOSIS — I10 Essential (primary) hypertension: Secondary | ICD-10-CM | POA: Diagnosis not present

## 2019-11-05 DIAGNOSIS — E785 Hyperlipidemia, unspecified: Secondary | ICD-10-CM | POA: Diagnosis present

## 2019-11-05 DIAGNOSIS — I130 Hypertensive heart and chronic kidney disease with heart failure and stage 1 through stage 4 chronic kidney disease, or unspecified chronic kidney disease: Secondary | ICD-10-CM | POA: Diagnosis present

## 2019-11-05 LAB — BASIC METABOLIC PANEL
Anion gap: 8 (ref 5–15)
BUN: 47 mg/dL — ABNORMAL HIGH (ref 8–23)
CO2: 25 mmol/L (ref 22–32)
Calcium: 8.1 mg/dL — ABNORMAL LOW (ref 8.9–10.3)
Chloride: 104 mmol/L (ref 98–111)
Creatinine, Ser: 1.08 mg/dL (ref 0.61–1.24)
GFR calc Af Amer: >60 mL/min (ref >60)
GFR calc non Af Amer: >60 mL/min (ref >60)
Glucose, Bld: 184 mg/dL — ABNORMAL HIGH (ref 70–99)
Potassium: 5.2 mmol/L — ABNORMAL HIGH (ref 3.5–5.1)
Sodium: 137 mmol/L (ref 135–145)

## 2019-11-05 LAB — GLUCOSE, CAPILLARY
Glucose-Capillary: 186 mg/dL — ABNORMAL HIGH (ref 70–99)
Glucose-Capillary: 221 mg/dL — ABNORMAL HIGH (ref 70–99)
Glucose-Capillary: 110 mg/dL — ABNORMAL HIGH (ref 70–99)
Glucose-Capillary: 306 mg/dL — ABNORMAL HIGH (ref 70–99)

## 2019-11-05 LAB — CBC
HCT: 46.3 % (ref 39.0–52.0)
Hemoglobin: 15.7 g/dL (ref 13.0–17.0)
MCH: 30 pg (ref 26.0–34.0)
MCHC: 33.9 g/dL (ref 30.0–36.0)
MCV: 88.5 fL (ref 80.0–100.0)
Platelets: 153 10*3/uL (ref 150–400)
RBC: 5.23 MIL/uL (ref 4.22–5.81)
RDW: 14.7 % (ref 11.5–15.5)
WBC: 19.2 10*3/uL — ABNORMAL HIGH (ref 4.0–10.5)
nRBC: 0 % (ref 0.0–0.2)

## 2019-11-05 LAB — PROCALCITONIN: Procalcitonin: <0.1 ng/mL

## 2019-11-05 LAB — TSH: TSH: 3.187 u[IU]/mL (ref 0.350–4.500)

## 2019-11-05 MED ORDER — METOPROLOL TARTRATE 50 MG PO TABS
50.0000 mg | ORAL_TABLET | Freq: Two times a day (BID) | ORAL | Status: DC
  Administered 2019-11-05 – 2019-11-09 (×7): 50 mg via ORAL
  Filled 2019-11-05 (×8): qty 1

## 2019-11-05 MED ORDER — ONDANSETRON HCL 4 MG PO TABS: 4.0000 mg | ORAL_TABLET | Freq: Four times a day (QID) | ORAL | Status: DC | PRN

## 2019-11-05 MED ORDER — POLYETHYLENE GLYCOL 3350 17 G PO PACK: 17.0000 g | PACK | Freq: Every day | ORAL | Status: DC | PRN

## 2019-11-05 MED ORDER — ONDANSETRON HCL 4 MG/2ML IJ SOLN: 4.0000 mg | Freq: Four times a day (QID) | INTRAMUSCULAR | Status: DC | PRN

## 2019-11-05 MED ORDER — TAMSULOSIN HCL 0.4 MG PO CAPS
0.4000 mg | ORAL_CAPSULE | Freq: Every day | ORAL | Status: DC
  Administered 2019-11-06 – 2019-11-09 (×4): 0.4 mg via ORAL
  Filled 2019-11-05 (×5): qty 1

## 2019-11-05 MED ORDER — LINAGLIPTIN 5 MG PO TABS
5.0000 mg | ORAL_TABLET | Freq: Every day | ORAL | Status: DC
  Administered 2019-11-05 – 2019-11-09 (×5): 5 mg via ORAL
  Filled 2019-11-05 (×5): qty 1

## 2019-11-05 MED ORDER — ZINC SULFATE 220 (50 ZN) MG PO CAPS
220.0000 mg | ORAL_CAPSULE | Freq: Every day | ORAL | Status: DC
  Administered 2019-11-05 – 2019-11-09 (×5): 220 mg via ORAL
  Filled 2019-11-05 (×5): qty 1

## 2019-11-05 MED ORDER — INSULIN DETEMIR 100 UNIT/ML ~~LOC~~ SOLN
10.0000 [IU] | Freq: Two times a day (BID) | SUBCUTANEOUS | Status: DC
  Administered 2019-11-05 – 2019-11-09 (×8): 10 [IU] via SUBCUTANEOUS
  Filled 2019-11-05 (×9): qty 0.1

## 2019-11-05 MED ORDER — DEXAMETHASONE SODIUM PHOSPHATE 10 MG/ML IJ SOLN
6.0000 mg | INTRAMUSCULAR | Status: DC
  Administered 2019-11-05 – 2019-11-06 (×2): 6 mg via INTRAVENOUS
  Filled 2019-11-05 (×2): qty 1

## 2019-11-05 MED ORDER — ALBUTEROL SULFATE HFA 108 (90 BASE) MCG/ACT IN AERS
2.0000 | INHALATION_SPRAY | Freq: Four times a day (QID) | RESPIRATORY_TRACT | Status: DC
  Administered 2019-11-05 – 2019-11-09 (×16): 2 via RESPIRATORY_TRACT
  Filled 2019-11-05: qty 6.7

## 2019-11-05 MED ORDER — ASCORBIC ACID 500 MG PO TABS
250.0000 mg | ORAL_TABLET | Freq: Every day | ORAL | Status: DC
  Administered 2019-11-05 – 2019-11-09 (×5): 250 mg via ORAL
  Filled 2019-11-05 (×5): qty 1

## 2019-11-05 MED ORDER — ACETAMINOPHEN 325 MG PO TABS: 650.0000 mg | ORAL_TABLET | Freq: Four times a day (QID) | ORAL | Status: DC | PRN

## 2019-11-05 MED ORDER — ACETAMINOPHEN 650 MG RE SUPP: 650.0000 mg | Freq: Four times a day (QID) | RECTAL | Status: DC | PRN

## 2019-11-05 MED ORDER — INSULIN ASPART 100 UNIT/ML ~~LOC~~ SOLN
0.0000 [IU] | Freq: Every day | SUBCUTANEOUS | Status: DC
  Administered 2019-11-05: 22:00:00 4 [IU] via SUBCUTANEOUS
  Administered 2019-11-06: 3 [IU] via SUBCUTANEOUS

## 2019-11-05 MED ORDER — PRAVASTATIN SODIUM 40 MG PO TABS
40.0000 mg | ORAL_TABLET | Freq: Every day | ORAL | Status: DC
  Administered 2019-11-05 – 2019-11-08 (×4): 40 mg via ORAL
  Filled 2019-11-05 (×4): qty 1

## 2019-11-05 MED ORDER — INSULIN ASPART 100 UNIT/ML ~~LOC~~ SOLN
0.0000 [IU] | Freq: Three times a day (TID) | SUBCUTANEOUS | Status: DC
  Administered 2019-11-06: 08:00:00 3 [IU] via SUBCUTANEOUS
  Administered 2019-11-06: 12:00:00 7 [IU] via SUBCUTANEOUS
  Administered 2019-11-06: 3 [IU] via SUBCUTANEOUS
  Administered 2019-11-07: 4 [IU] via SUBCUTANEOUS
  Administered 2019-11-07: 11 [IU] via SUBCUTANEOUS
  Administered 2019-11-07: 08:00:00 3 [IU] via SUBCUTANEOUS
  Administered 2019-11-08 – 2019-11-09 (×2): 4 [IU] via SUBCUTANEOUS

## 2019-11-05 NOTE — Progress Notes (Signed)
Pt. Sitting up in chair eating lunch. O2 titrated down to 10L. Temp 96.1. Warming blanket on.  Handoff report given to [employer], Charity fundraiser.

## 2019-11-05 NOTE — Progress Notes (Signed)
CRITICAL CARE PROGRESS NOTE          Date: 11/05/2019,   MRN# [id number] Walter Baxter Sep 03, 1941     AdmissionWeight: 105.2 kg                 CurrentWeight: 98.7 kg      CHIEF COMPLAINT:   Increased O2 requirement with COVID 19 pneumonitis   SUBJECTIVE   Patient sitting up in chair.  He remains on 6 to 8 L/min nasal cannula with desaturation during physical therapy however rebounds to initial settings within 5 minutes.  He was very concerned regarding COVID-19 sick wife at home, we had [facility] department do a wellness check and she seems to be okay now.     Initiated [facility] to [facility].  Patient has been accepted per protocol.  PAST MEDICAL HISTORY   Past Medical History:  Diagnosis Date  . Allergic rhinitis   . Angio-edema   . Chronic bronchitis (HCC)   . Erectile dysfunction   . Essential hypertension   . Hyperlipidemia   . Obesity   . Type 2 diabetes mellitus (HCC)   [id number]   SURGICAL HISTORY   Past Surgical History:  Procedure Laterality Date  . ADENOIDECTOMY    . TONSILLECTOMY       FAMILY HISTORY   Family History  Problem Relation Age of Onset  . Lupus Sister      SOCIAL HISTORY   Social History   Tobacco Use  . Smoking status: Never Smoker  . Smokeless tobacco: Never Used  Substance Use Topics  . Alcohol use: Not Currently  . Drug use: Never     MEDICATIONS    Home Medication:    Current Medication:  Current Facility-Administered Medications:  .  0.9 %  sodium chloride infusion, , Intravenous, PRN, Arnetha Courser, MD, Stopped at 10/24/19 1900 .  chlorhexidine (PERIDEX) 0.12 % solution 15 mL, 15 mL, Mouth Rinse, BID, Arnetha Courser, MD, 15 mL at 11/05/19 0825 .  Chlorhexidine Gluconate Cloth 2 % PADS 6 each, 6 each, Topical, Daily, Arnetha Courser, MD, 6 each at 11/05/19 0825 .  famotidine (PEPCID) tablet 20 mg, 20 mg, Oral, BID, Arnetha Courser, MD, 20 mg at 11/05/19 0819 .   guaiFENesin-dextromethorphan (ROBITUSSIN DM) 100-10 MG/5ML syrup 10 mL, 10 mL, Oral, Q4H PRN, Arnetha Courser, MD, 10 mL at 10/29/19 0831 .  insulin aspart (novoLOG) injection 0-9 Units, 0-9 Units, Subcutaneous, TID WC, Arnetha Courser, MD, 2 Units at 11/05/19 [phone] .  insulin aspart (novoLOG) injection 6 Units, 6 Units, Subcutaneous, TID WC, Arnetha Courser, MD, 6 Units at 11/05/19 [phone] .  insulin detemir (LEVEMIR) injection 10 Units, 10 Units, Subcutaneous, BID, Lynn Ito, MD, 10 Units at 11/05/19 1020 .  Ipratropium-Albuterol (COMBIVENT) respimat 1 puff, 1 puff, Inhalation, Q6H, Arnetha Courser, MD, 1 puff at 11/05/19 [phone] .  MEDLINE mouth rinse, 15 mL, Mouth Rinse, q12n4p, Arnetha Courser, MD, 15 mL at 11/04/19 1446 .  methylPREDNISolone sodium succinate (SOLU-MEDROL) 40 mg/mL injection 40 mg, 40 mg, Intravenous, Q12H, Arnetha Courser, MD, 40 mg at 11/05/19 0114 .  ondansetron (ZOFRAN) tablet 4 mg, 4 mg, Oral, Q6H PRN **OR** ondansetron (ZOFRAN) injection 4 mg, 4 mg, Intravenous, Q6H PRN, Arnetha Courser, MD .  protein supplement (ENSURE MAX) liquid, 11 oz, Oral, BID BM, Arnetha Courser, MD, 11 oz at 11/05/19 0825 .  Rivaroxaban (XARELTO) tablet 15 mg, 15 mg, Oral, BID AC, 15 mg at 11/05/19 0819 **FOLLOWED BY** [START ON 11/16/2019] rivaroxaban (  XARELTO) tablet 20 mg, 20 mg, Oral, Q supper, [name], [name], MD .  sodium chloride flush (NS) 0.9 % injection 3 mL, 3 mL, Intravenous, Q12H, Lorella Nimrod, MD, 3 mL at 11/05/19 0821 .  sucralfate (CARAFATE) 1 GM/10ML suspension 1 g, 1 g, Oral, TID WC & HS, Lorella Nimrod, MD, 1 g at 11/05/19 0821 .  sulfamethoxazole-trimethoprim (BACTRIM) 400-80 MG per tablet 1 tablet, 1 tablet, Oral, Q12H, Ottie Glazier, MD, 1 tablet at 11/05/19 0820 .  tamsulosin (FLOMAX) capsule 0.4 mg, 0.4 mg, Oral, Daily, Lorella Nimrod, MD, 0.4 mg at 11/05/19 4742    ALLERGIES   Other     REVIEW OF SYSTEMS    Review of Systems:  Gen:  Denies  fever, sweats, chills weigh loss  HEENT:  Denies blurred vision, double vision, ear pain, eye pain, hearing loss, nose bleeds, sore throat Cardiac:  No dizziness, chest pain or heaviness, chest tightness,edema Resp:   Reports minimal cough,sputum porduction, but significant shortness of breath, however without wheezing, and no hemoptysis,  Gi: Denies swallowing difficulty, stomach pain, nausea or vomiting, diarrhea, constipation, bowel incontinence Gu:  Denies bladder incontinence, burning urine Ext:   Denies Joint pain, stiffness or swelling Skin: Denies  skin rash, easy bruising or bleeding or hives Endoc:  Denies polyuria, polydipsia , polyphagia or weight change Psych:   Denies depression, insomnia or hallucinations   Other:  All other systems negative   VS: BP (!) 143/76   Pulse 84   Temp (!) 97.3 F (36.3 C) (Axillary)   Resp (!) 25   Ht 5\' 8"  (1.727 m)   Wt 98.7 kg   SpO2 91%   BMI 33.07 kg/m      PHYSICAL EXAM    GENERAL:NAD, no fevers, chills, no weakness no fatigue HEAD: Normocephalic, atraumatic.  EYES: Pupils equal, round, reactive to light. Extraocular muscles intact. No scleral icterus.  MOUTH: Moist mucosal membrane. Dentition intact. No abscess noted.  EAR, NOSE, THROAT: Clear without exudates. No external lesions.  NECK: Supple. No thyromegaly. No nodules. No JVD.  PULMONARY: rhochi improved CARDIOVASCULAR: S1 and S2. Regular rate and rhythm. No murmurs, rubs, or gallops. No edema. Pedal pulses 2+ bilaterally.  GASTROINTESTINAL: Soft, nontender, nondistended. No masses. Positive bowel sounds. No hepatosplenomegaly.  MUSCULOSKELETAL: No swelling, clubbing, or edema. Range of motion full in all extremities.  NEUROLOGIC: Cranial nerves II through XII are intact. No gross focal neurological deficits. Sensation intact. Reflexes intact.  SKIN: No ulceration, lesions, rashes, or cyanosis. Skin warm and dry. Turgor intact.  PSYCHIATRIC: Mood, affect within normal limits. The patient is awake, alert and  oriented x 3. Insight, judgment intact.       IMAGING    CT ANGIO CHEST PE W OR WO CONTRAST  Result Date: 10/26/2019 CLINICAL DATA:  Shortness of breath, COVID positive EXAM: CT ANGIOGRAPHY CHEST WITH CONTRAST TECHNIQUE: Multidetector CT imaging of the chest was performed using the standard protocol during bolus administration of intravenous contrast. Multiplanar CT image reconstructions and MIPs were obtained to evaluate the vascular anatomy. CONTRAST:  33mL OMNIPAQUE IOHEXOL 350 MG/ML SOLN COMPARISON:  None. FINDINGS: Cardiovascular: Satisfactory opacification of the pulmonary arteries to the proximal segmental level. No evidence of pulmonary embolism. Normal heart size. No pericardial effusion. Coronary artery and thoracic aorta atherosclerotic calcification. Mediastinum/Nodes: No mediastinal, hilar, or axillary adenopathy. Thyroid is unremarkable. Esophagus appears normal. Lungs/Pleura: There is multifocal consolidation greatest at the periphery and lung bases. No pleural effusion or pneumothorax. Upper Abdomen: No acute abnormality. Musculoskeletal: No  chest wall abnormality. No acute or significant osseous findings. Review of the MIP images confirms the above findings. IMPRESSION: No evidence of acute pulmonary embolism. Bilateral consolidative opacities likely reflecting pneumonia (including COVID-19). Aortic Atherosclerosis (ICD10-I70.0). Electronically Signed   By: Guadlupe Spanish M.D.   On: 10/26/2019 17:03   US Venous Img Lower Bilateral (DVT)  Result Date: 10/26/2019 CLINICAL DATA:  Lower extremity edema for 1 day EXAM: BILATERAL LOWER EXTREMITY VENOUS DOPPLER ULTRASOUND TECHNIQUE: Gray-scale sonography with graded compression, as well as color Doppler and duplex ultrasound were performed to evaluate the lower extremity deep venous systems from the level of the common femoral vein and including the common femoral, femoral, profunda femoral, popliteal and calf veins including the posterior  tibial, peroneal and gastrocnemius veins when visible. The superficial great saphenous vein was also interrogated. Spectral Doppler was utilized to evaluate flow at rest and with distal augmentation maneuvers in the common femoral, femoral and popliteal veins. COMPARISON:  None. FINDINGS: RIGHT LOWER EXTREMITY Common Femoral Vein: No evidence of thrombus. Normal compressibility, respiratory phasicity and response to augmentation. Saphenofemoral Junction: No evidence of thrombus. Normal compressibility and flow on color Doppler imaging. Profunda Femoral Vein: No evidence of thrombus. Normal compressibility and flow on color Doppler imaging. Femoral Vein: No evidence of thrombus. Normal compressibility, respiratory phasicity and response to augmentation. Popliteal Vein: No evidence of thrombus. Normal compressibility, respiratory phasicity and response to augmentation. Calf Veins: Calf posterior tibial and peroneal veins demonstrate intraluminal thrombus appearing occlusive in the posterior tibial vein and nonocclusive in the peroneal vein. Vessels are noncompressible. Very low thrombus burden. LEFT LOWER EXTREMITY Common Femoral Vein: No evidence of thrombus. Normal compressibility, respiratory phasicity and response to augmentation. Saphenofemoral Junction: No evidence of thrombus. Normal compressibility and flow on color Doppler imaging. Profunda Femoral Vein: No evidence of thrombus. Normal compressibility and flow on color Doppler imaging. Femoral Vein: No evidence of thrombus. Normal compressibility, respiratory phasicity and response to augmentation. Popliteal Vein: No evidence of thrombus. Normal compressibility, respiratory phasicity and response to augmentation. Calf Veins: No evidence of thrombus. Normal compressibility and flow on color Doppler imaging. IMPRESSION: Positive exam for calf region right posterior tibial and peroneal vein DVT. Very low thrombus burden. No propagation into the popliteal or  femoral veins. Negative for left lower extremity DVT. Electronically Signed   By: Judie Petit.  Shick M.D.   On: 10/26/2019 11:54   DG Chest Port 1 View  Result Date: 11/05/2019 CLINICAL DATA:  Pulmonary disease. Shortness of breath, generalized weakness. EXAM: PORTABLE CHEST 1 VIEW COMPARISON:  Chest x-rays dated 11/03/2019, 10/30/2019 and 10/20/2019. FINDINGS: No significant change of the opacities along the periphery of the RIGHT mid and lower lung zones. Similar milder changes along the periphery of the LEFT mid and lower lung zones. These opacities were previously described as bilateral multifocal pneumonia. No new lung findings. No pleural effusion or pneumothorax is seen. Stable borderline cardiomegaly. IMPRESSION: Stable appearance of the bilateral multifocal pneumonia. Electronically Signed   By: Bary Richard M.D.   On: 11/05/2019 10:41   DG Chest Port 1 View  Result Date: 11/03/2019 CLINICAL DATA:  Shortness of breath. EXAM: PORTABLE CHEST 1 VIEW COMPARISON:  October 30, 2019. FINDINGS: Stable cardiomediastinal silhouette. No pneumothorax or pleural effusion is noted. Stable bilateral patchy airspace opacities are noted, right greater than left, consistent with multifocal pneumonia. Bony thorax is unremarkable. IMPRESSION: Stable bilateral lung opacities are noted, right greater than left, consistent with multifocal pneumonia. Electronically Signed   By: Roque Lias  Jr M.D.   On: 11/03/2019 08:19   DG Chest Port 1 View  Result Date: 10/30/2019 CLINICAL DATA:  Shortness of breath. EXAM: PORTABLE CHEST 1 VIEW COMPARISON:  October 28, 2019. FINDINGS: Stable cardiomediastinal silhouette. No pneumothorax or pleural effusion is noted. Stable bilateral patchy airspace opacities are noted consistent with multifocal pneumonia. Bony thorax is unremarkable. IMPRESSION: Stable bilateral multifocal pneumonia. Electronically Signed   By: Lupita Raider M.D.   On: 10/30/2019 09:02   DG Chest Port 1 View  Result  Date: 10/28/2019 CLINICAL DATA:  79 year old male with pneumonia. EXAM: PORTABLE CHEST 1 VIEW COMPARISON:  Chest radiograph dated 10/24/2019 and CT dated 10/26/2019. FINDINGS: Bilateral mid to lower lung field subpleural and streaky densities appear similar or slightly improved since the prior radiograph. No large pleural effusion or pneumothorax. Stable cardiomediastinal silhouette. Atherosclerotic calcification of the aorta. No acute osseous pathology. IMPRESSION: Similar or slightly improved pulmonary opacities. Continued follow-up recommended. Electronically Signed   By: Elgie Collard M.D.   On: 10/28/2019 20:37   DG Chest Port 1 View  Result Date: 10/24/2019 CLINICAL DATA:  Shortness of breath. EXAM: PORTABLE CHEST 1 VIEW COMPARISON:  October 20, 2019. FINDINGS: Stable cardiomediastinal silhouette. Atherosclerosis of thoracic aorta is noted. No pneumothorax or pleural effusion is noted. Mildly increased bilateral patchy ill-defined airspace opacities are noted consistent with multifocal pneumonia. Bony thorax is unremarkable. IMPRESSION: Aortic atherosclerosis. Mildly increased bilateral patchy airspace opacities are noted consistent with multifocal pneumonia. Electronically Signed   By: Lupita Raider M.D.   On: 10/24/2019 08:08   DG Chest Port 1 View  Result Date: 10/20/2019 CLINICAL DATA:  Shortness of breath, COVID exposure. EXAM: PORTABLE CHEST 1 VIEW COMPARISON:  Chest radiograph dated 02/10/2011. FINDINGS: The heart appears enlarged. Vascular calcifications are seen in the aortic arch. Moderate airspace opacities are seen in the peripheral right mid lung and in the left lower lung. There is no pleural effusion or pneumothorax. The osseous structures are intact. IMPRESSION: Moderate airspace opacities in the peripheral right mid lung and left lower lung may reflect COVID-19 pneumonia. Electronically Signed   By: Romona Curls M.D.   On: 10/20/2019 18:11              ASSESSMENT/PLAN     Acute hypoxemic respiratory failure due to COVID 19 induced pneumonia - Currently increased from 5 to 7L/min [place] - patient is s/p Vecluri and Actemra -CT chest - reviewed by me as above with bilateral multifocal atypical pneumonia - Solumedrol - changing to 40 BID  - AKI resolved - Chest phyisotherapy - bed PT - please encourage patient to use IS and flutter valve - I worked closely with patient and he is now able to inhale Vt at  - will hold diuretic- due to mild AKI - Unable to add any nebulized medication due to COVID precautions -PT/OT - participating and doing well with intermittent desaturations - repeat CXR in am 11/03/19 to compare to previous from 10/30/19- relatively unchanged.    DVT   - patient already on anticoagulation   - no signs of bleeding     Critical care provider statement:    Critical care time (minutes):  33   Critical care time was exclusive of:  Separately billable procedures and  treating other patients   Critical care was necessary to treat or prevent imminent or  life-threatening deterioration of the following conditions:  acute hypoxemic respioratory failure, COVID19 induced pneumonitis, multiple comorbid conditions   Critical care was  time spent personally by me on the following  activities:  Development of treatment plan with patient or surrogate,  discussions with consultants, evaluation of patient's response to  treatment, examination of patient, obtaining history from patient or  surrogate, ordering and performing treatments and interventions, ordering  and review of laboratory studies and re-evaluation of patient's condition   I assumed direction of critical care for this patient from another  provider in my specialty: no       Thank you for allowing me to participate in the care of this patient.     Patient/Family are satisfied with care plan and all questions have been answered.  This document was prepared using Dragon voice  recognition software and may include unintentional dictation errors.     Vida Rigger, M.D.  Division of Pulmonary & Critical Care Medicine  [facility]

## 2019-11-05 NOTE — Progress Notes (Signed)
Wife updated prior to transport, then updated again when patient was transported to [facility].  Wife stated she did not know he had COVID.  Christean Grief, RN

## 2019-11-05 NOTE — Discharge Summary (Signed)
CRITICAL CARE DISCHARGE SUMMARY          Date: 11/05/2019,   MRN# [id number] Walter Baxter 78-Jul-1942     AdmissionWeight: 105.2 kg                 CurrentWeight: 98.7 kg      CHIEF COMPLAINT:   Increased O2 requirement with COVID 19 pneumonitis   SUBJECTIVE   Patient sitting up in chair.  He remains on 6 to 8 L/min nasal cannula with desaturation during physical therapy however rebounds to initial settings within 5 minutes.  He was very concerned regarding COVID-19 sick wife at home, we had [facility] department do a wellness check and she seems to be okay now.     Initiated [facility] to [facility].  Patient has been accepted per protocol.  PAST MEDICAL HISTORY   Past Medical History:  Diagnosis Date  . Allergic rhinitis   . Angio-edema   . Chronic bronchitis (HCC)   . Erectile dysfunction   . Essential hypertension   . Hyperlipidemia   . Obesity   . Type 2 diabetes mellitus (HCC)   [id number]   SURGICAL HISTORY   Past Surgical History:  Procedure Laterality Date  . ADENOIDECTOMY    . TONSILLECTOMY       FAMILY HISTORY   Family History  Problem Relation Age of Onset  . Lupus Sister      SOCIAL HISTORY   Social History   Tobacco Use  . Smoking status: Never Smoker  . Smokeless tobacco: Never Used  Substance Use Topics  . Alcohol use: Not Currently  . Drug use: Never     MEDICATIONS    Home Medication:    Current Medication:  Current Facility-Administered Medications:  .  0.9 %  sodium chloride infusion, , Intravenous, PRN, Arnetha Courser, MD, Stopped at 10/24/19 1900 .  chlorhexidine (PERIDEX) 0.12 % solution 15 mL, 15 mL, Mouth Rinse, BID, Arnetha Courser, MD, 15 mL at 11/05/19 0825 .  Chlorhexidine Gluconate Cloth 2 % PADS 6 each, 6 each, Topical, Daily, Arnetha Courser, MD, 6 each at 11/05/19 0825 .  famotidine (PEPCID) tablet 20 mg, 20 mg, Oral, BID, Arnetha Courser, MD, 20 mg at 11/05/19 0819 .   guaiFENesin-dextromethorphan (ROBITUSSIN DM) 100-10 MG/5ML syrup 10 mL, 10 mL, Oral, Q4H PRN, Arnetha Courser, MD, 10 mL at 10/29/19 0831 .  insulin aspart (novoLOG) injection 0-9 Units, 0-9 Units, Subcutaneous, TID WC, Arnetha Courser, MD, 3 Units at 11/05/19 1201 .  insulin aspart (novoLOG) injection 6 Units, 6 Units, Subcutaneous, TID WC, Arnetha Courser, MD, 6 Units at 11/05/19 1201 .  insulin detemir (LEVEMIR) injection 10 Units, 10 Units, Subcutaneous, BID, Lynn Ito, MD, 10 Units at 11/05/19 1020 .  Ipratropium-Albuterol (COMBIVENT) respimat 1 puff, 1 puff, Inhalation, Q6H, Arnetha Courser, MD, 1 puff at 11/05/19 [phone] .  MEDLINE mouth rinse, 15 mL, Mouth Rinse, q12n4p, Arnetha Courser, MD, 15 mL at 11/05/19 1159 .  methylPREDNISolone sodium succinate (SOLU-MEDROL) 40 mg/mL injection 40 mg, 40 mg, Intravenous, Q12H, Arnetha Courser, MD, 40 mg at 11/05/19 1159 .  ondansetron (ZOFRAN) tablet 4 mg, 4 mg, Oral, Q6H PRN **OR** ondansetron (ZOFRAN) injection 4 mg, 4 mg, Intravenous, Q6H PRN, Arnetha Courser, MD .  protein supplement (ENSURE MAX) liquid, 11 oz, Oral, BID BM, Arnetha Courser, MD, 11 oz at 11/05/19 0825 .  Rivaroxaban (XARELTO) tablet 15 mg, 15 mg, Oral, BID AC, 15 mg at 11/05/19 0819 **FOLLOWED BY** [START ON 11/16/2019] rivaroxaban (  XARELTO) tablet 20 mg, 20 mg, Oral, Q supper, [name], [name], MD .  sodium chloride flush (NS) 0.9 % injection 3 mL, 3 mL, Intravenous, Q12H, Lorella Nimrod, MD, 3 mL at 11/05/19 0821 .  sucralfate (CARAFATE) 1 GM/10ML suspension 1 g, 1 g, Oral, TID WC & HS, Lorella Nimrod, MD, 1 g at 11/05/19 1159 .  sulfamethoxazole-trimethoprim (BACTRIM) 400-80 MG per tablet 1 tablet, 1 tablet, Oral, Q12H, Ottie Glazier, MD, 1 tablet at 11/05/19 0820 .  tamsulosin (FLOMAX) capsule 0.4 mg, 0.4 mg, Oral, Daily, Lorella Nimrod, MD, 0.4 mg at 11/05/19 8786    ALLERGIES   Other     REVIEW OF SYSTEMS    Review of Systems:  Gen:  Denies  fever, sweats, chills weigh loss  HEENT:  Denies blurred vision, double vision, ear pain, eye pain, hearing loss, nose bleeds, sore throat Cardiac:  No dizziness, chest pain or heaviness, chest tightness,edema Resp:   Reports minimal cough,sputum porduction, but significant shortness of breath, however without wheezing, and no hemoptysis,  Gi: Denies swallowing difficulty, stomach pain, nausea or vomiting, diarrhea, constipation, bowel incontinence Gu:  Denies bladder incontinence, burning urine Ext:   Denies Joint pain, stiffness or swelling Skin: Denies  skin rash, easy bruising or bleeding or hives Endoc:  Denies polyuria, polydipsia , polyphagia or weight change Psych:   Denies depression, insomnia or hallucinations   Other:  All other systems negative   VS: BP 125/64   Pulse (!) 103   Temp (!) 96.1 F (35.6 C) (Axillary)   Resp (!) 26   Ht 5\' 8"  (1.727 m)   Wt 98.7 kg   SpO2 94%   BMI 33.07 kg/m      PHYSICAL EXAM    GENERAL:NAD, no fevers, chills, no weakness no fatigue HEAD: Normocephalic, atraumatic.  EYES: Pupils equal, round, reactive to light. Extraocular muscles intact. No scleral icterus.  MOUTH: Moist mucosal membrane. Dentition intact. No abscess noted.  EAR, NOSE, THROAT: Clear without exudates. No external lesions.  NECK: Supple. No thyromegaly. No nodules. No JVD.  PULMONARY: rhochi improved CARDIOVASCULAR: S1 and S2. Regular rate and rhythm. No murmurs, rubs, or gallops. No edema. Pedal pulses 2+ bilaterally.  GASTROINTESTINAL: Soft, nontender, nondistended. No masses. Positive bowel sounds. No hepatosplenomegaly.  MUSCULOSKELETAL: No swelling, clubbing, or edema. Range of motion full in all extremities.  NEUROLOGIC: Cranial nerves II through XII are intact. No gross focal neurological deficits. Sensation intact. Reflexes intact.  SKIN: No ulceration, lesions, rashes, or cyanosis. Skin warm and dry. Turgor intact.  PSYCHIATRIC: Mood, affect within normal limits. The patient is awake, alert and  oriented x 3. Insight, judgment intact.       IMAGING    CT ANGIO CHEST PE W OR WO CONTRAST  Result Date: 10/26/2019 CLINICAL DATA:  Shortness of breath, COVID positive EXAM: CT ANGIOGRAPHY CHEST WITH CONTRAST TECHNIQUE: Multidetector CT imaging of the chest was performed using the standard protocol during bolus administration of intravenous contrast. Multiplanar CT image reconstructions and MIPs were obtained to evaluate the vascular anatomy. CONTRAST:  53mL OMNIPAQUE IOHEXOL 350 MG/ML SOLN COMPARISON:  None. FINDINGS: Cardiovascular: Satisfactory opacification of the pulmonary arteries to the proximal segmental level. No evidence of pulmonary embolism. Normal heart size. No pericardial effusion. Coronary artery and thoracic aorta atherosclerotic calcification. Mediastinum/Nodes: No mediastinal, hilar, or axillary adenopathy. Thyroid is unremarkable. Esophagus appears normal. Lungs/Pleura: There is multifocal consolidation greatest at the periphery and lung bases. No pleural effusion or pneumothorax. Upper Abdomen: No acute abnormality. Musculoskeletal: No  chest wall abnormality. No acute or significant osseous findings. Review of the MIP images confirms the above findings. IMPRESSION: No evidence of acute pulmonary embolism. Bilateral consolidative opacities likely reflecting pneumonia (including COVID-19). Aortic Atherosclerosis (ICD10-I70.0). Electronically Signed   By: Guadlupe Spanish M.D.   On: 10/26/2019 17:03   US Venous Img Lower Bilateral (DVT)  Result Date: 10/26/2019 CLINICAL DATA:  Lower extremity edema for 1 day EXAM: BILATERAL LOWER EXTREMITY VENOUS DOPPLER ULTRASOUND TECHNIQUE: Gray-scale sonography with graded compression, as well as color Doppler and duplex ultrasound were performed to evaluate the lower extremity deep venous systems from the level of the common femoral vein and including the common femoral, femoral, profunda femoral, popliteal and calf veins including the posterior  tibial, peroneal and gastrocnemius veins when visible. The superficial great saphenous vein was also interrogated. Spectral Doppler was utilized to evaluate flow at rest and with distal augmentation maneuvers in the common femoral, femoral and popliteal veins. COMPARISON:  None. FINDINGS: RIGHT LOWER EXTREMITY Common Femoral Vein: No evidence of thrombus. Normal compressibility, respiratory phasicity and response to augmentation. Saphenofemoral Junction: No evidence of thrombus. Normal compressibility and flow on color Doppler imaging. Profunda Femoral Vein: No evidence of thrombus. Normal compressibility and flow on color Doppler imaging. Femoral Vein: No evidence of thrombus. Normal compressibility, respiratory phasicity and response to augmentation. Popliteal Vein: No evidence of thrombus. Normal compressibility, respiratory phasicity and response to augmentation. Calf Veins: Calf posterior tibial and peroneal veins demonstrate intraluminal thrombus appearing occlusive in the posterior tibial vein and nonocclusive in the peroneal vein. Vessels are noncompressible. Very low thrombus burden. LEFT LOWER EXTREMITY Common Femoral Vein: No evidence of thrombus. Normal compressibility, respiratory phasicity and response to augmentation. Saphenofemoral Junction: No evidence of thrombus. Normal compressibility and flow on color Doppler imaging. Profunda Femoral Vein: No evidence of thrombus. Normal compressibility and flow on color Doppler imaging. Femoral Vein: No evidence of thrombus. Normal compressibility, respiratory phasicity and response to augmentation. Popliteal Vein: No evidence of thrombus. Normal compressibility, respiratory phasicity and response to augmentation. Calf Veins: No evidence of thrombus. Normal compressibility and flow on color Doppler imaging. IMPRESSION: Positive exam for calf region right posterior tibial and peroneal vein DVT. Very low thrombus burden. No propagation into the popliteal or  femoral veins. Negative for left lower extremity DVT. Electronically Signed   By: Judie Petit.  Shick M.D.   On: 10/26/2019 11:54   DG Chest Port 1 View  Result Date: 11/05/2019 CLINICAL DATA:  Pulmonary disease. Shortness of breath, generalized weakness. EXAM: PORTABLE CHEST 1 VIEW COMPARISON:  Chest x-rays dated 11/03/2019, 10/30/2019 and 10/20/2019. FINDINGS: No significant change of the opacities along the periphery of the RIGHT mid and lower lung zones. Similar milder changes along the periphery of the LEFT mid and lower lung zones. These opacities were previously described as bilateral multifocal pneumonia. No new lung findings. No pleural effusion or pneumothorax is seen. Stable borderline cardiomegaly. IMPRESSION: Stable appearance of the bilateral multifocal pneumonia. Electronically Signed   By: Bary Richard M.D.   On: 11/05/2019 10:41   DG Chest Port 1 View  Result Date: 11/03/2019 CLINICAL DATA:  Shortness of breath. EXAM: PORTABLE CHEST 1 VIEW COMPARISON:  October 30, 2019. FINDINGS: Stable cardiomediastinal silhouette. No pneumothorax or pleural effusion is noted. Stable bilateral patchy airspace opacities are noted, right greater than left, consistent with multifocal pneumonia. Bony thorax is unremarkable. IMPRESSION: Stable bilateral lung opacities are noted, right greater than left, consistent with multifocal pneumonia. Electronically Signed   By: Roque Lias  Jr M.D.   On: 11/03/2019 08:19   DG Chest Port 1 View  Result Date: 10/30/2019 CLINICAL DATA:  Shortness of breath. EXAM: PORTABLE CHEST 1 VIEW COMPARISON:  October 28, 2019. FINDINGS: Stable cardiomediastinal silhouette. No pneumothorax or pleural effusion is noted. Stable bilateral patchy airspace opacities are noted consistent with multifocal pneumonia. Bony thorax is unremarkable. IMPRESSION: Stable bilateral multifocal pneumonia. Electronically Signed   By: Lupita Raider M.D.   On: 10/30/2019 09:02   DG Chest Port 1 View  Result  Date: 10/28/2019 CLINICAL DATA:  79 year old male with pneumonia. EXAM: PORTABLE CHEST 1 VIEW COMPARISON:  Chest radiograph dated 10/24/2019 and CT dated 10/26/2019. FINDINGS: Bilateral mid to lower lung field subpleural and streaky densities appear similar or slightly improved since the prior radiograph. No large pleural effusion or pneumothorax. Stable cardiomediastinal silhouette. Atherosclerotic calcification of the aorta. No acute osseous pathology. IMPRESSION: Similar or slightly improved pulmonary opacities. Continued follow-up recommended. Electronically Signed   By: Elgie Collard M.D.   On: 10/28/2019 20:37   DG Chest Port 1 View  Result Date: 10/24/2019 CLINICAL DATA:  Shortness of breath. EXAM: PORTABLE CHEST 1 VIEW COMPARISON:  October 20, 2019. FINDINGS: Stable cardiomediastinal silhouette. Atherosclerosis of thoracic aorta is noted. No pneumothorax or pleural effusion is noted. Mildly increased bilateral patchy ill-defined airspace opacities are noted consistent with multifocal pneumonia. Bony thorax is unremarkable. IMPRESSION: Aortic atherosclerosis. Mildly increased bilateral patchy airspace opacities are noted consistent with multifocal pneumonia. Electronically Signed   By: Lupita Raider M.D.   On: 10/24/2019 08:08   DG Chest Port 1 View  Result Date: 10/20/2019 CLINICAL DATA:  Shortness of breath, COVID exposure. EXAM: PORTABLE CHEST 1 VIEW COMPARISON:  Chest radiograph dated 02/10/2011. FINDINGS: The heart appears enlarged. Vascular calcifications are seen in the aortic arch. Moderate airspace opacities are seen in the peripheral right mid lung and in the left lower lung. There is no pleural effusion or pneumothorax. The osseous structures are intact. IMPRESSION: Moderate airspace opacities in the peripheral right mid lung and left lower lung may reflect COVID-19 pneumonia. Electronically Signed   By: Romona Curls M.D.   On: 10/20/2019 18:11              ASSESSMENT/PLAN     Acute hypoxemic respiratory failure due to COVID 19 induced pneumonia - Currently increased from 5 to 7L/min [place] - patient is s/p Vecluri and Actemra -CT chest - reviewed by me as above with bilateral multifocal atypical pneumonia - Solumedrol - changing to 40 BID  - AKI resolved - Chest phyisotherapy - bed PT - please encourage patient to use IS and flutter valve - I worked closely with patient and he is now able to inhale Vt at  - will hold diuretic- due to mild AKI - Unable to add any nebulized medication due to COVID precautions -PT/OT - participating and doing well with intermittent desaturations - repeat CXR in am 11/03/19 to compare to previous from 10/30/19- relatively unchanged.    DVT   - patient already on anticoagulation   - no signs of bleeding     Critical care provider statement:    Critical care time (minutes):  33   Critical care time was exclusive of:  Separately billable procedures and  treating other patients   Critical care was necessary to treat or prevent imminent or  life-threatening deterioration of the following conditions:  acute hypoxemic respioratory failure, COVID19 induced pneumonitis, multiple comorbid conditions   Critical care was  time spent personally by me on the following  activities:  Development of treatment plan with patient or surrogate,  discussions with consultants, evaluation of patient's response to  treatment, examination of patient, obtaining history from patient or  surrogate, ordering and performing treatments and interventions, ordering  and review of laboratory studies and re-evaluation of patient's condition   I assumed direction of critical care for this patient from another  provider in my specialty: no       Thank you for allowing me to participate in the care of this patient.     Patient/Family are satisfied with care plan and all questions have been answered.  This document was prepared using Dragon voice  recognition software and may include unintentional dictation errors.     Vida Rigger, M.D.  Division of Pulmonary & Critical Care Medicine  [facility]

## 2019-11-05 NOTE — Progress Notes (Signed)
Paged B. Jon Billings, NP concerning pt temp 53F. Bair Hugger applied as soon as available @0220 .

## 2019-11-05 NOTE — Progress Notes (Signed)
Assisted pt. To chair, requring 12L [place]. Warming blanket on r/t to temp 97.3. IS 750.

## 2019-11-05 NOTE — Plan of Care (Signed)
Went over Plan of Care with pt. All questions asked and all questions answered. 

## 2019-11-05 NOTE — Progress Notes (Signed)
Temp. 96.6 - warming blanket placed on pt.

## 2019-11-05 NOTE — H&P (Signed)
History and physical  Walter Baxter ASN:[id number] DOB: Dec 26, 1940 DOA: 11/05/2019 PCP: Mayra Neer, MD  HPI/Recap of past 63 hours: 79 year old male with past medical history of diabetes, diet-controlled plus hypertension and obesity admitted on 1/28 at [facility] for shortness of breath and found to have Covid.  Patient was treated with Actemra, IV remdesivir and steroids.  Hospital course complicated by right lower extremity DVT.  Patient's oxygenation continued to decline and he currently is on 7 L nasal cannula.  He was transferred to [facility] today, 2/13.    Following arrival, patient is feeling a little bit better, although still easily winded.  Complains of cough.  Assessment/Plan: Active Problems:   Acute hypoxemic respiratory failure due to COVID-19 [facility]): Completed remdesivir and Actemra.  Still significantly hypoxic.  Continue steroids.  We will try proning and encourage incentive spirometry use.    Essential hypertension: Blood pressure stable.  Borderline cardiomegaly: Noted on chest x-ray.  We will check a BNP.    Hyperlipidemia: Continue statin.  Urinary retention issues: Continue Flomax.  Currently stable.     Type 2 diabetes mellitus ([place]): Relatively well controlled with some hyperglycemia due to steroids.  On diet controlled at home.  A1c at 6.1.  Right lower extremity DVT: Secondary to Covid.  On Xarelto.    Obesity: Patient meets criteria BMI greater than 30.  E. coli UTI: Completed antibiotic course.  Pansensitive. Code Status: Full code  Family Communication: Updated wife by phone   Disposition Plan: PT recommending SNF, although pt would prefer going home.  Try to send out once oxygenation gets better, down to 2 or 3 liters.  Likely several days.    Consultants:  Critical care  Procedures:  Lower extremity Dopplers done 2/3 noting positive DVT in right posterior tibial and peroneal vein  Antimicrobials:  Completed course of Bactrim  DVT  prophylaxis: Xarelto   Objective: Vitals:   11/05/19 1645 11/05/19 1700  BP: 119/64 118/72  Pulse: 79 87  Resp: 20 20  Temp: (!) 97.1 F (36.2 C)   SpO2: 94% 95%   No intake or output data in the 24 hours ending 11/05/19 1752 Filed Weights   There is no height or weight on file to calculate BMI.  Exam:   General: Alert and oriented x3, no acute distress  HEENT: Normocephalic and atraumatic, mucous membranes are moist  Neck: Supple, no JVD  Cardiovascular: Regular rate and rhythm, S1-S2  Respiratory: Clear to auscultation bilaterally  Abdomen: Soft, nontender, nondistended, positive bowel sounds  Musculoskeletal: No clubbing or cyanosis or edema  Skin: No skin breaks, tears or lesions  Psychiatry: Appropriate, no evidence of psychoses   Data Reviewed: CBC: Recent Labs  Lab 11/01/19 0548 11/02/19 0424 11/03/19 0522 11/04/19 0641 11/05/19 0606  WBC 18.0* 16.1* 18.5* 14.3* 19.2*  HGB 14.4 14.9 15.3 15.5 15.7  HCT 43.9 44.6 45.7 45.8 46.3  MCV 90.0 88.7 88.9 88.6 88.5  PLT 152 146* 159 143* 193   Basic Metabolic Panel: Recent Labs  Lab 11/01/19 0548 11/02/19 0424 11/03/19 0522 11/04/19 0641 11/05/19 0606  NA 136 138 135 136 137  K 5.1 4.9 4.8 4.9 5.2*  CL 104 102 100 104 104  CO2 24 28 28 23 25   GLUCOSE 228* 210* 209* 203* 184*  BUN 29* 39* 51* 43* 47*  CREATININE 0.97 1.16 1.26* 1.08 1.08  CALCIUM 7.7* 7.9* 8.0* 8.1* 8.1*   GFR: CrCl cannot be calculated (Unknown ideal weight.). Liver Function Tests: No results  for input(s): AST, ALT, ALKPHOS, BILITOT, PROT, ALBUMIN in the last 168 hours. No results for input(s): LIPASE, AMYLASE in the last 168 hours. No results for input(s): AMMONIA in the last 168 hours. Coagulation Profile: No results for input(s): INR, PROTIME in the last 168 hours. Cardiac Enzymes: No results for input(s): CKTOTAL, CKMB, CKMBINDEX, TROPONINI in the last 168 hours. BNP (last 3 results) No results for input(s): PROBNP  in the last 8760 hours. HbA1C: No results for input(s): HGBA1C in the last 72 hours. CBG: Recent Labs  Lab 11/04/19 1623 11/04/19 2204 11/05/19 0738 11/05/19 1157 11/05/19 1700  GLUCAP 228* 319* 186* 221* 110*   Lipid Profile: No results for input(s): CHOL, HDL, LDLCALC, TRIG, CHOLHDL, LDLDIRECT in the last 72 hours. Thyroid Function Tests: Recent Labs    11/05/19 0606  TSH 3.187   Anemia Panel: No results for input(s): VITAMINB12, FOLATE, FERRITIN, TIBC, IRON, RETICCTPCT in the last 72 hours. Urine analysis:    Component Value Date/Time   COLORURINE YELLOW (A) 10/30/2019 1018   APPEARANCEUR CLEAR (A) 10/30/2019 1018   LABSPEC 1.026 10/30/2019 1018   PHURINE 5.0 10/30/2019 1018   GLUCOSEU 50 (A) 10/30/2019 1018   HGBUR NEGATIVE 10/30/2019 1018   BILIRUBINUR NEGATIVE 10/30/2019 1018   KETONESUR NEGATIVE 10/30/2019 1018   PROTEINUR NEGATIVE 10/30/2019 1018   NITRITE NEGATIVE 10/30/2019 1018   LEUKOCYTESUR SMALL (A) 10/30/2019 1018   Sepsis Labs: @LABRCNTIP (procalcitonin:4,lacticidven:4)  ) Recent Results (from the past 240 hour(s))  Urine Culture     Status: Abnormal   Collection Time: 10/30/19 11:14 AM   Specimen: Urine, Random  Result Value Ref Range Status   Specimen Description   Final    URINE, RANDOM Performed at [facility], [address] [place], [place]    Special Requests   Final    NONE Performed at [facility], [address] [place], [place]    Culture >=100,000 COLONIES/mL ESCHERICHIA COLI (A)  Final   Report Status 11/01/2019 FINAL  Final   Organism ID, Bacteria ESCHERICHIA COLI (A)  Final      Susceptibility   Escherichia coli - MIC*    AMPICILLIN >=32 RESISTANT Resistant     CEFAZOLIN <=4 SENSITIVE Sensitive     CEFTRIAXONE <=0.25 SENSITIVE Sensitive     CIPROFLOXACIN >=4 RESISTANT Resistant     GENTAMICIN <=1 SENSITIVE Sensitive     IMIPENEM <=0.25 SENSITIVE Sensitive     NITROFURANTOIN <=16  SENSITIVE Sensitive     TRIMETH/SULFA <=20 SENSITIVE Sensitive     AMPICILLIN/SULBACTAM >=32 RESISTANT Resistant     PIP/TAZO <=4 SENSITIVE Sensitive     * >=100,000 COLONIES/mL ESCHERICHIA COLI  MRSA PCR Screening     Status: None   Collection Time: 10/30/19 11:30 AM   Specimen: Nasal Mucosa; Nasopharyngeal  Result Value Ref Range Status   MRSA by PCR NEGATIVE NEGATIVE Final    Comment:        The GeneXpert MRSA Assay (FDA approved for NASAL specimens only), is one component of a comprehensive MRSA colonization surveillance program. It is not intended to diagnose MRSA infection nor to guide or monitor treatment for MRSA infections. Performed at [facility], [address] [place], [place]       Studies: [facility] Chest [place] 1 View  Result Date: 11/05/2019 CLINICAL DATA:  Pulmonary disease. Shortness of breath, generalized weakness. EXAM: PORTABLE CHEST 1 VIEW COMPARISON:  Chest x-rays dated 11/03/2019, 10/30/2019 and 10/20/2019. FINDINGS: No significant change of  the opacities along the periphery of the RIGHT mid and lower lung zones. Similar milder changes along the periphery of the LEFT mid and lower lung zones. These opacities were previously described as bilateral multifocal pneumonia. No new lung findings. No pleural effusion or pneumothorax is seen. Stable borderline cardiomegaly. IMPRESSION: Stable appearance of the bilateral multifocal pneumonia. Electronically Signed   By: Bary Richard M.D.   On: 11/05/2019 10:41    Scheduled Meds: . albuterol  2 puff Inhalation Q6H  . vitamin C  250 mg Oral Daily  . dexamethasone (DECADRON) injection  6 mg Intravenous Q24H  . insulin aspart  0-20 Units Subcutaneous TID WC  . insulin aspart  0-5 Units Subcutaneous QHS  . insulin detemir  10 Units Subcutaneous BID  . linagliptin  5 mg Oral Daily  . metoprolol tartrate  50 mg Oral BID  . pravastatin  40 mg Oral q1800  . [START ON 11/06/2019] tamsulosin  0.4 mg Oral  Daily  . zinc sulfate  220 mg Oral Daily    Continuous Infusions:   LOS: 0 days     Hollice Espy, MD [facility]   11/05/2019, 5:52 PM

## 2019-11-06 ENCOUNTER — Inpatient Hospital Stay (HOSPITAL_COMMUNITY): Payer: Medicare Other

## 2019-11-06 DIAGNOSIS — U071 COVID-19: Principal | ICD-10-CM

## 2019-11-06 DIAGNOSIS — I82451 Acute embolism and thrombosis of right peroneal vein: Secondary | ICD-10-CM | POA: Diagnosis present

## 2019-11-06 DIAGNOSIS — I5040 Unspecified combined systolic (congestive) and diastolic (congestive) heart failure: Secondary | ICD-10-CM

## 2019-11-06 DIAGNOSIS — I824Y1 Acute embolism and thrombosis of unspecified deep veins of right proximal lower extremity: Secondary | ICD-10-CM

## 2019-11-06 DIAGNOSIS — I10 Essential (primary) hypertension: Secondary | ICD-10-CM

## 2019-11-06 DIAGNOSIS — I5031 Acute diastolic (congestive) heart failure: Secondary | ICD-10-CM | POA: Clinically undetermined

## 2019-11-06 DIAGNOSIS — J9601 Acute respiratory failure with hypoxia: Secondary | ICD-10-CM

## 2019-11-06 LAB — COMPREHENSIVE METABOLIC PANEL
ALT: 63 U/L — ABNORMAL HIGH (ref 0–44)
AST: 26 U/L (ref 15–41)
Albumin: 2.6 g/dL — ABNORMAL LOW (ref 3.5–5.0)
Alkaline Phosphatase: 78 U/L (ref 38–126)
Anion gap: 8 (ref 5–15)
BUN: 54 mg/dL — ABNORMAL HIGH (ref 8–23)
CO2: 25 mmol/L (ref 22–32)
Calcium: 8.5 mg/dL — ABNORMAL LOW (ref 8.9–10.3)
Chloride: 104 mmol/L (ref 98–111)
Creatinine, Ser: 1.2 mg/dL (ref 0.61–1.24)
GFR calc Af Amer: >60 mL/min (ref >60)
GFR calc non Af Amer: 58 mL/min — ABNORMAL LOW (ref >60)
Glucose, Bld: 178 mg/dL — ABNORMAL HIGH (ref 70–99)
Potassium: 4.9 mmol/L (ref 3.5–5.1)
Sodium: 137 mmol/L (ref 135–145)
Total Bilirubin: 1 mg/dL (ref 0.3–1.2)
Total Protein: 5.1 g/dL — ABNORMAL LOW (ref 6.5–8.1)

## 2019-11-06 LAB — CBC WITH DIFFERENTIAL/PLATELET
Abs Immature Granulocytes: 0.16 10*3/uL — ABNORMAL HIGH (ref 0.00–0.07)
Basophils Absolute: 0 10*3/uL (ref 0.0–0.1)
Basophils Relative: 0 %
Eosinophils Absolute: 0 10*3/uL (ref 0.0–0.5)
Eosinophils Relative: 0 %
HCT: 44.5 % (ref 39.0–52.0)
Hemoglobin: 14.9 g/dL (ref 13.0–17.0)
Immature Granulocytes: 1 %
Lymphocytes Relative: 6 %
Lymphs Abs: 1.1 10*3/uL (ref 0.7–4.0)
MCH: 30 pg (ref 26.0–34.0)
MCHC: 33.5 g/dL (ref 30.0–36.0)
MCV: 89.7 fL (ref 80.0–100.0)
Monocytes Absolute: 1.3 10*3/uL — ABNORMAL HIGH (ref 0.1–1.0)
Monocytes Relative: 6 %
Neutro Abs: 17.7 10*3/uL — ABNORMAL HIGH (ref 1.7–7.7)
Neutrophils Relative %: 87 %
Platelets: 166 10*3/uL (ref 150–400)
RBC: 4.96 MIL/uL (ref 4.22–5.81)
RDW: 14.6 % (ref 11.5–15.5)
WBC: 20.3 10*3/uL — ABNORMAL HIGH (ref 4.0–10.5)
nRBC: 0 % (ref 0.0–0.2)

## 2019-11-06 LAB — GLUCOSE, CAPILLARY
Glucose-Capillary: 128 mg/dL — ABNORMAL HIGH (ref 70–99)
Glucose-Capillary: 232 mg/dL — ABNORMAL HIGH (ref 70–99)
Glucose-Capillary: 135 mg/dL — ABNORMAL HIGH (ref 70–99)
Glucose-Capillary: 259 mg/dL — ABNORMAL HIGH (ref 70–99)

## 2019-11-06 LAB — D-DIMER, QUANTITATIVE: D-Dimer, Quant: 1.45 ug/mL-FEU — ABNORMAL HIGH (ref 0.00–0.50)

## 2019-11-06 LAB — BRAIN NATRIURETIC PEPTIDE: B Natriuretic Peptide: 85.2 pg/mL (ref 0.0–100.0)

## 2019-11-06 LAB — ECHOCARDIOGRAM COMPLETE
Height: 68 in
Weight: 3449.76 oz

## 2019-11-06 LAB — ABO/RH: ABO/RH(D): O POS

## 2019-11-06 LAB — C-REACTIVE PROTEIN: CRP: 0.5 mg/dL (ref <1.0)

## 2019-11-06 LAB — FERRITIN: Ferritin: 1502 ng/mL — ABNORMAL HIGH (ref 24–336)

## 2019-11-06 MED ORDER — RIVAROXABAN 20 MG PO TABS: 20.0000 mg | ORAL_TABLET | Freq: Every day | ORAL | Status: DC

## 2019-11-06 MED ORDER — FUROSEMIDE 10 MG/ML IJ SOLN
20.0000 mg | Freq: Two times a day (BID) | INTRAMUSCULAR | Status: DC
  Administered 2019-11-06: 18:00:00 20 mg via INTRAVENOUS
  Filled 2019-11-06 (×2): qty 2

## 2019-11-06 MED ORDER — RIVAROXABAN 15 MG PO TABS
15.0000 mg | ORAL_TABLET | Freq: Two times a day (BID) | ORAL | Status: DC
  Administered 2019-11-06 – 2019-11-07 (×3): 15 mg via ORAL
  Filled 2019-11-06 (×3): qty 1

## 2019-11-06 MED ORDER — RIVAROXABAN 20 MG PO TABS
20.0000 mg | ORAL_TABLET | Freq: Every day | ORAL | Status: DC
  Filled 2019-11-06: qty 1

## 2019-11-06 NOTE — Evaluation (Signed)
Physical Therapy Evaluation Patient Details Name: Walter Baxter MRN: [id number] DOB: 1940-12-27 Today's Date: 11/06/2019   History of Present Illness  79 year old male with past medical history of diabetes, diet-controlled plus hypertension and obesity admitted on 1/28 at [facility] for shortness of breath and found to have Covid.  Patient was treated with Actemra, IV remdesivir and steroids.  Hospital course complicated by right lower extremity DVT.  Patient's oxygenation continued to decline and he currently is on 7 L nasal cannula.  He was transferred to [facility] today, 2/13.   Clinical Impression  Very pleasant male patient, receptive to PT- hindered by oxygen demands (does not use any O2 at home) generalized weakness and obesity. He was independent in all PLOF and assists wife (she has used RW for several years). He should benefit from PT to address goals as outlined for optimal functional outcomes. He is at risk for falls and fatigues easily. Will need close monitor due to desats.    Follow Up Recommendations SNF    Equipment Recommendations  Rolling walker with 5" wheels    Recommendations for Other Services       Precautions / Restrictions Precautions Precautions: Fall;Other (comment) Precaution Comments: Monitor SpO2 Restrictions Weight Bearing Restrictions: No Other Position/Activity Restrictions: watch SpO2, at 12L HFNC initially while seated up in chair, increased to 15L HFNC with t/f to [facility] and during toileting. Pt de-satted to 76% with transfer very briefly, but gradually rebounded over 2-3 minutes seated RB to 88%-90% Pt O2 waxed and waned throughout seated BM with RR reaching 40 breaths per minute at peak and able to come to 24 breaths per minute before engaging in t/f back to chair. Seated in chair at end of session, turned back down to 12L HFNC, Sats 90-92% as long as pt not speaking, some desat to ~85-86% when speaking.      Mobility  Bed Mobility Overal bed  mobility: Needs Assistance Bed Mobility: Supine to Sit Rolling: Supervision Sidelying to sit: Supervision;HOB elevated Supine to sit: HOB elevated;Supervision Sit to supine: Min assist   General bed mobility comments: Use of bedrail  Transfers Overall transfer level: Needs assistance Equipment used: Rolling walker (2 wheeled) Transfers: Sit to/from [employer] Sit to Stand: Min guard Stand pivot transfers: Min guard       General transfer comment: To ensure balance and safety. Noted 0 instances of LOB, however pt unsteady on feet.  Ambulation/Gait Ambulation/Gait assistance: Min guard Gait Distance (Feet): 8 Feet(began to desat after 4 minutes in standing) Assistive device: Rolling walker (2 wheeled) Gait Pattern/deviations: WFL(Within Functional Limits)     General Gait Details: Could not attempt more ambulation at this time due to fatigue and desats.  Stairs            Wheelchair Mobility    Modified Rankin (Stroke Patients Only)       Balance Overall balance assessment: Mild deficits observed, not formally tested Sitting-balance support: Feet supported;No upper extremity supported Sitting balance-Leahy Scale: Good Sitting balance - Comments: NO LOB noted while sitting EOB   Standing balance support: Bilateral upper extremity supported Standing balance-Leahy Scale: Fair Standing balance comment: unsteady primarily r/t fatigue/O2 needs                             Pertinent Vitals/Pain Pain Assessment: No/denies pain Pain Location: abdomen  Says "they gave me Mucinex, and I cannot take it at home either" Pain Descriptors / Indicators:  Sore    Home Living Family/patient expects to be discharged to:: Private residence Living Arrangements: Spouse/significant other Available Help at Discharge: Family Type of Home: House Home Access: Ramped entrance     Home Layout: One level Home Equipment: Shower seat;Grab bars -  tub/shower;Bedside commode Additional Comments: Pt reports that wife uses a walker and he assists her with IADLs.     Prior Function Level of Independence: Independent         Comments: Pt independent in ADLs, IADLs, and mobility. Pt does not ambulate with an assistive device and reports 0 falls in the last 6 months. Pt still drives. Pt does not use oxygen at home.      Hand Dominance   Dominant Hand: Right    Extremity/Trunk Assessment   Upper Extremity Assessment Upper Extremity Assessment: Generalized weakness    Lower Extremity Assessment Lower Extremity Assessment: Generalized weakness    Cervical / Trunk Assessment Cervical / Trunk Assessment: Normal  Communication   Communication: No difficulties  Cognition Arousal/Alertness: Awake/alert Behavior During Therapy: WFL for tasks assessed/performed Overall Cognitive Status: Within Functional Limits for tasks assessed                                 General Comments: A&O      General Comments General comments (skin integrity, edema, etc.): Needs close monitorin for SpO2 as he tends to desat easily and fatigues.    Exercises General Exercises - Lower Extremity Ankle Circles/Pumps: AROM;Seated Quad Sets: AROM;Seated Short Arc Quad: AROM;Seated Hip ABduction/ADduction: AROM;Seated Hip Flexion/Marching: AROM;Seated Other Exercises Other Exercises: Flutter valve x 10 with min cues on technique. Other Exercises: Incentive spirometer x 5 with min cues on technique. Pulling .  Other Exercises: Has been instructed in quad sets, SLRs, ab-add, and ankle pumps- reminded to do 1-2 x daily for the LE ex and IS/Flutter valve hourly   Assessment/Plan    PT Assessment Patient needs continued PT services  PT Problem List Decreased strength;Decreased range of motion;Decreased activity tolerance;Decreased balance;Decreased mobility;Decreased coordination;Decreased knowledge of use of DME;Decreased safety  awareness;Decreased knowledge of precautions;Cardiopulmonary status limiting activity       PT Treatment Interventions DME instruction;Gait training;Functional mobility training;Therapeutic activities;Therapeutic exercise;Patient/family education    PT Goals (Current goals can be found in the Care Plan section)  Acute Rehab PT Goals Patient Stated Goal: to go home PT Goal Formulation: With patient Time For Goal Achievement: 11/20/19 Potential to Achieve Goals: Good    Frequency Min 3X/week   Barriers to discharge Decreased caregiver support      Co-evaluation               AM-PAC PT "6 Clicks" Mobility  Outcome Measure Help needed turning from your back to your side while in a flat bed without using bedrails?: A Little Help needed moving from lying on your back to sitting on the side of a flat bed without using bedrails?: A Little Help needed moving to and from a bed to a chair (including a wheelchair)?: A Little Help needed standing up from a chair using your arms (e.g., wheelchair or bedside chair)?: A Little Help needed to walk in hospital room?: A Lot Help needed climbing 3-5 steps with a railing? : A Lot 6 Click Score: 16    End of Session Equipment Utilized During Treatment: Oxygen Activity Tolerance: Patient limited by fatigue Patient left: in chair;with call bell/phone within reach;with chair alarm set  Nurse Communication: Mobility status PT Visit Diagnosis: Difficulty in walking, not elsewhere classified (R26.2);Muscle weakness (generalized) (M62.81)    Time: 0630-1601 PT Time Calculation (min) (ACUTE ONLY): 35 min   Charges:   PT Evaluation $PT Eval Moderate Complexity: 1 Mod PT Treatments $Therapeutic Exercise: 8-22 mins       Rollen Sox, PT # [phone] CGV cell   Casandra Doffing 11/06/2019, 1:06 PM

## 2019-11-06 NOTE — Progress Notes (Signed)
Occupational Therapy Evaluation Patient Details Name: Walter Baxter MRN: [id number] DOB: [birth year] Today's Date: 11/06/2019    History of Present Illness 79 year old male with past medical history of diabetes, diet-controlled plus hypertension and obesity admitted on 1/28 at [facility] for shortness of breath and found to have Covid.  Patient was treated with Actemra, IV remdesivir and steroids.  Hospital course complicated by right lower extremity DVT.  Patient's oxygenation continued to decline and he currently is on 7 L nasal cannula.  He was transferred to [facility] today, 2/13.    Clinical Impression   PTA pt lived with his wife, independent in all ADL, IADL, and mobility tasks. Pt does not ambulate with an assistive device and reports 0 falls in the last 6 months. Pt still drives. Pt states that wife ambulates with a [id number] and he has to assist her with IADLs. Pt does not use oxygen at home and is currently on 5L HFNC. Pt currently independent to min assist for self-care and functional transfer tasks. Pt tolerated sitting edge of bed ~10 min while engaging in self-care tasks. Pt tolerated standing ~1 min and transferring to bedside chair with RW and min guard. Noted 0 instances of loss of balance, however pt unsteady on feet. Pt unable to tolerate additional standing tasks due to fatigue, reporting moderate shortness of breath. SpO2 dropped to 84% during activity with pt requiring ~2 min seated recovery to return to 90s. Educated/instructed pt on safety strategies and breathing exercises with fair understanding and follow through. Pt demonstrates decreased strength, endurance, balance, standing tolerance, and activity tolerance impacting ability to complete self-care and functional transfer tasks. Recommend skilled OT services to address above deficits in order to promote function and prevent further decline. Recommend SNF placement for additional rehab prior to discharge home.     Follow Up  Recommendations  SNF(may progress to [facility] OT with 24/7 assist)    Equipment Recommendations  None recommended by OT    Recommendations for Other Services       Precautions / Restrictions Precautions Precautions: Fall;Other (comment) Precaution Comments: Monitor SpO2 Restrictions Weight Bearing Restrictions: No      Mobility Bed Mobility Overal bed mobility: Needs Assistance Bed Mobility: Supine to Sit     Supine to sit: HOB elevated;Supervision     General bed mobility comments: Use of bedrail  Transfers Overall transfer level: Needs assistance Equipment used: Rolling walker (2 wheeled) Transfers: Sit to/from [employer] Sit to Stand: Min guard Stand pivot transfers: Min guard       General transfer comment: To ensure balance and safety. Noted 0 instances of LOB, however pt unsteady on feet.    Balance Overall balance assessment: Mild deficits observed, not formally tested                                         ADL either performed or assessed with clinical judgement   ADL Overall ADL's : Needs assistance/impaired Eating/Feeding: Independent;Sitting   Grooming: Set up;Supervision/safety;Sitting   Upper Body Bathing: Set up;Supervision/ safety;Sitting   Lower Body Bathing: Sit to/from stand;Minimal assistance   Upper Body Dressing : Set up;Supervision/safety;Sitting   Lower Body Dressing: Sit to/from stand;Minimal assistance   Toilet Transfer: Min guard;BSC   Toileting- Clothing Manipulation and Hygiene: Sit to/from stand;Minimal assistance       Functional mobility during ADLs: Min guard General ADL Comments: Pt able to  stand ~1 min and transfer to bedside chair with RW and min guard. Pt unable to tolerate standing for longer period due to fatigue and SOB.     Vision Baseline Vision/History: No visual deficits       Perception     Praxis      Pertinent Vitals/Pain Pain Assessment: No/denies pain     Hand  Dominance Right   Extremity/Trunk Assessment Upper Extremity Assessment Upper Extremity Assessment: Generalized weakness   Lower Extremity Assessment Lower Extremity Assessment: Defer to PT evaluation       Communication Communication Communication: No difficulties   Cognition Arousal/Alertness: Awake/alert Behavior During Therapy: WFL for tasks assessed/performed Overall Cognitive Status: Within Functional Limits for tasks assessed                                     General Comments  Pt on 5L HFNC with SpO2 94% at rest. SpO2 dropped to 84% with minimal activity and pt reporting mod SOB.     Exercises Exercises: Other exercises Other Exercises Other Exercises: Flutter valve x 10 with min cues on technique. Other Exercises: Incentive spirometer x 5 with min cues on technique. Pulling 633mL.    Shoulder Instructions      Home Living Family/patient expects to be discharged to:: Private residence Living Arrangements: Spouse/significant other Available Help at Discharge: Family Type of Home: House Home Access: [place]: One level     Bathroom Shower/Tub: Tub/shower unit   [place]: Shower seat;Grab bars - tub/shower;Bedside commode   Additional Comments: Pt reports that wife uses a walker and he assists her with IADLs.       Prior Functioning/Environment Level of Independence: Independent        Comments: Pt independent in ADLs, IADLs, and mobility. Pt does not ambulate with an assistive device and reports 0 falls in the last 6 months. Pt still drives. Pt does not use oxygen at home.         OT Problem List: Decreased strength;Decreased activity tolerance;Impaired balance (sitting and/or standing);Cardiopulmonary status limiting activity      OT Treatment/Interventions: Self-care/ADL training;Therapeutic exercise;Neuromuscular education;Energy conservation;DME and/or AE  instruction;Therapeutic activities;Patient/family education;Balance training    OT Goals(Current goals can be found in the care plan section) Acute Rehab OT Goals Patient Stated Goal: to go home Time For Goal Achievement: 11/20/19 Potential to Achieve Goals: Good ADL Goals Pt Will Perform Grooming: with modified independence;standing Pt Will Perform Lower Body Bathing: with modified independence;sit to/from stand Pt Will Perform Lower Body Dressing: with modified independence;sit to/from stand Pt Will Transfer to Toilet: with modified independence;ambulating;regular height toilet Pt Will Perform Toileting - Clothing Manipulation and hygiene: with modified independence;sit to/from stand Pt/caregiver will Perform Home Exercise Program: Increased strength;Both right and left upper extremity;With written HEP provided;With theraband;Independently Additional ADL Goal #1: Pt to tolerate standing up to 5 min with modified independence, in preparation for ADLs. Additional ADL Goal #2: Pt to recall and verbalize 3 energy conservation strategies with 0 verbal cues.  OT Frequency: Min 3X/week   Barriers to D/C:            Co-evaluation              AM-PAC OT "6 Clicks" Daily Activity     Outcome Measure Help from another person eating meals?: None Help from another person  taking care of personal grooming?: A Little Help from another person toileting, which includes using toliet, bedpan, or urinal?: A Little Help from another person bathing (including washing, rinsing, drying)?: A Little Help from another person to put on and taking off regular upper body clothing?: A Little Help from another person to put on and taking off regular lower body clothing?: A Little 6 Click Score: 19   End of Session Equipment Utilized During Treatment: Rolling walker;Oxygen Nurse Communication: Mobility status  Activity Tolerance: Patient limited by fatigue;Other (comment)(Limited by SOB ) Patient left: in  chair;with call bell/phone within reach;with chair alarm set  OT Visit Diagnosis: Unsteadiness on feet (R26.81);Muscle weakness (generalized) (M62.81)                Time: 0569-7948 OT Time Calculation (min): 40 min Charges:  OT General Charges $OT Visit: 1 Visit OT Evaluation $OT Eval Moderate Complexity: 1 Mod OT Treatments $Self Care/Home Management : 8-22 mins $Therapeutic Activity: 8-22 mins  Peterson Ao OTR/L [phone]  Peterson Ao 11/06/2019, 10:22 AM

## 2019-11-06 NOTE — Progress Notes (Signed)
  Echocardiogram 2D Echocardiogram has been performed.  Janalyn Harder 11/06/2019, 4:30 PM

## 2019-11-06 NOTE — Progress Notes (Signed)
Progress note  Walter Baxter TDD:[id number] DOB: Feb 05, 1941 DOA: 11/05/2019 PCP: Lupita Raider, MD  HPI/Recap of past 26 hours: 79 year old male with past medical history of diabetes, diet-controlled plus hypertension and obesity admitted on 1/28 at [facility] for shortness of breath and found to have Covid.  Patient was treated with Actemra, IV remdesivir and steroids.  Hospital course complicated by right lower extremity DVT.  Patient's oxygenation continued to decline and he currently is on 7 L nasal cannula.  He was transferred to [facility] today, 2/13.    Overnight no issues.  Patient has been able to be weaned down to 5 L nasal cannula.  He was encouraged to try proning which she is feels like it is helping some.  Assessment/Plan: Active Problems:   Acute hypoxemic respiratory failure due to COVID-19 [facility]): Completed remdesivir and Actemra.  Still significantly hypoxic.  Continue steroids.  Continue encouraging proning and incentive spirometry use.  Slowly weaning down oxygen.    Essential hypertension: Blood pressure stable.  Borderline cardiomegaly: Noted on chest x-ray.  Normal BNP, but given slow improvement, will check underlying echocardiogram to rule out diastolic dysfunction.    Hyperlipidemia: Continue statin.  Urinary retention issues: Continue Flomax.  Currently stable.     Type 2 diabetes mellitus (HCC): Relatively well controlled with some hyperglycemia due to steroids.  Continue sliding scale and Levemir.  On diet controlled at home.  A1c at 6.1.  Right lower extremity DVT: Secondary to Covid.  On Xarelto.    Obesity: Patient meets criteria BMI greater than 30.  E. coli UTI: Completed antibiotic course.  Pansensitive. Code Status: Full code  Family Communication: Updated wife by phone   Disposition Plan: PT recommending SNF, although pt would prefer going home.  Try to discharge once oxygenation gets better, down to 2 or 3 liters.  Likely several days.     Consultants:  Critical care  Procedures:  Lower extremity Dopplers done 2/3 noting positive DVT in right posterior tibial and peroneal vein  Antimicrobials:  Completed course of Bactrim  DVT prophylaxis: Xarelto   Objective: Vitals:   11/06/19 1137 11/06/19 1250  BP: 113/64   Pulse:    Resp:    Temp: (!) 96.2 F (35.7 C)   SpO2:  95%    Intake/Output Summary (Last 24 hours) at 11/06/2019 1405 Last data filed at 11/06/2019 0943 Gross per 24 hour  Intake 350 ml  Output 650 ml  Net -300 ml   Filed Weights   11/05/19 1516  Weight: 97.8 kg   Body mass index is 32.78 kg/m.  Exam:   General: Alert and oriented x3, no acute distress  HEENT: Normocephalic and atraumatic, mucous membranes are moist  Neck: Supple, no JVD  Cardiovascular: Regular rate and rhythm, S1-S2  Respiratory: Clear to auscultation bilaterally  Abdomen: Soft, nontender, nondistended, positive bowel sounds  Musculoskeletal: No clubbing or cyanosis or edema  Skin: No skin breaks, tears or lesions  Psychiatry: Appropriate, no evidence of psychoses   Data Reviewed: CBC: Recent Labs  Lab 11/02/19 0424 11/03/19 0522 11/04/19 0641 11/05/19 0606 11/06/19 0242  WBC 16.1* 18.5* 14.3* 19.2* 20.3*  NEUTROABS  --   --   --   --  17.7*  HGB 14.9 15.3 15.5 15.7 14.9  HCT 44.6 45.7 45.8 46.3 44.5  MCV 88.7 88.9 88.6 88.5 89.7  PLT 146* 159 143* 153 166   Basic Metabolic Panel: Recent Labs  Lab 11/02/19 0424 11/03/19 0522 11/04/19 0641 11/05/19 0606 11/06/19  0242  NA 138 135 136 137 137  K 4.9 4.8 4.9 5.2* 4.9  CL 102 100 104 104 104  CO2 28 28 23 25 25   GLUCOSE 210* 209* 203* 184* 178*  BUN 39* 51* 43* 47* 54*  CREATININE 1.16 1.26* 1.08 1.08 1.20  CALCIUM 7.9* 8.0* 8.1* 8.1* 8.5*   GFR: Estimated Creatinine Clearance: 58.5 mL/min (by C-G formula based on SCr of 1.2 mg/dL). Liver Function Tests: Recent Labs  Lab 11/06/19 0242  AST 26  ALT 63*  ALKPHOS 78  BILITOT 1.0   PROT 5.1*  ALBUMIN 2.6*   No results for input(s): LIPASE, AMYLASE in the last 168 hours. No results for input(s): AMMONIA in the last 168 hours. Coagulation Profile: No results for input(s): INR, PROTIME in the last 168 hours. Cardiac Enzymes: No results for input(s): CKTOTAL, CKMB, CKMBINDEX, TROPONINI in the last 168 hours. BNP (last 3 results) No results for input(s): PROBNP in the last 8760 hours. HbA1C: No results for input(s): HGBA1C in the last 72 hours. CBG: Recent Labs  Lab 11/05/19 1157 11/05/19 1700 11/05/19 2153 11/06/19 0814 11/06/19 1136  GLUCAP 221* 110* 306* 128* 232*   Lipid Profile: No results for input(s): CHOL, HDL, LDLCALC, TRIG, CHOLHDL, LDLDIRECT in the last 72 hours. Thyroid Function Tests: Recent Labs    11/05/19 0606  TSH 3.187   Anemia Panel: Recent Labs    11/06/19 0242  FERRITIN 1,502*   Urine analysis:    Component Value Date/Time   COLORURINE YELLOW (A) 10/30/2019 1018   APPEARANCEUR CLEAR (A) 10/30/2019 1018   LABSPEC 1.026 10/30/2019 1018   PHURINE 5.0 10/30/2019 1018   GLUCOSEU 50 (A) 10/30/2019 1018   HGBUR NEGATIVE 10/30/2019 1018   BILIRUBINUR NEGATIVE 10/30/2019 1018   KETONESUR NEGATIVE 10/30/2019 1018   PROTEINUR NEGATIVE 10/30/2019 1018   NITRITE NEGATIVE 10/30/2019 1018   LEUKOCYTESUR SMALL (A) 10/30/2019 1018   Sepsis Labs: @LABRCNTIP (procalcitonin:4,lacticidven:4)  ) Recent Results (from the past 240 hour(s))  Urine Culture     Status: Abnormal   Collection Time: 10/30/19 11:14 AM   Specimen: Urine, Random  Result Value Ref Range Status   Specimen Description   Final    URINE, RANDOM Performed at [facility], [address] [place], [place] [address]    Special Requests   Final    NONE Performed at [facility], [address] [place], [place] [address]    Culture >=100,000 COLONIES/mL ESCHERICHIA COLI (A)  Final   Report Status 11/01/2019 FINAL  Final   Organism ID, Bacteria  ESCHERICHIA COLI (A)  Final      Susceptibility   Escherichia coli - MIC*    AMPICILLIN >=32 RESISTANT Resistant     CEFAZOLIN <=4 SENSITIVE Sensitive     CEFTRIAXONE <=0.25 SENSITIVE Sensitive     CIPROFLOXACIN >=4 RESISTANT Resistant     GENTAMICIN <=1 SENSITIVE Sensitive     IMIPENEM <=0.25 SENSITIVE Sensitive     NITROFURANTOIN <=16 SENSITIVE Sensitive     TRIMETH/SULFA <=20 SENSITIVE Sensitive     AMPICILLIN/SULBACTAM >=32 RESISTANT Resistant     PIP/TAZO <=4 SENSITIVE Sensitive     * >=100,000 COLONIES/mL ESCHERICHIA COLI  MRSA PCR Screening     Status: None   Collection Time: 10/30/19 11:30 AM   Specimen: Nasal Mucosa; Nasopharyngeal  Result Value Ref Range Status   MRSA by PCR NEGATIVE NEGATIVE Final    Comment:        The GeneXpert MRSA Assay (FDA approved for  NASAL specimens only), is one component of a comprehensive MRSA colonization surveillance program. It is not intended to diagnose MRSA infection nor to guide or monitor treatment for MRSA infections. Performed at [facility], [address], [place], [place] [address]       Studies: No results found.  Scheduled Meds: . albuterol  2 puff Inhalation Q6H  . vitamin C  250 mg Oral Daily  . dexamethasone (DECADRON) injection  6 mg Intravenous Q24H  . insulin aspart  0-20 Units Subcutaneous TID WC  . insulin aspart  0-5 Units Subcutaneous QHS  . insulin detemir  10 Units Subcutaneous BID  . linagliptin  5 mg Oral Daily  . metoprolol tartrate  50 mg Oral BID  . pravastatin  40 mg Oral q1800  . rivaroxaban  15 mg Oral BID  . [START ON 11/17/2019] rivaroxaban  20 mg Oral Q supper  . tamsulosin  0.4 mg Oral Daily  . zinc sulfate  220 mg Oral Daily    Continuous Infusions:   LOS: 1 day     Hollice Espy, MD [facility]   11/06/2019, 2:05 PM

## 2019-11-07 DIAGNOSIS — I82451 Acute embolism and thrombosis of right peroneal vein: Secondary | ICD-10-CM

## 2019-11-07 LAB — COMPREHENSIVE METABOLIC PANEL
ALT: 61 U/L — ABNORMAL HIGH (ref 0–44)
AST: 25 U/L (ref 15–41)
Albumin: 2.7 g/dL — ABNORMAL LOW (ref 3.5–5.0)
Alkaline Phosphatase: 76 U/L (ref 38–126)
Anion gap: 11 (ref 5–15)
BUN: 56 mg/dL — ABNORMAL HIGH (ref 8–23)
CO2: 25 mmol/L (ref 22–32)
Calcium: 8.3 mg/dL — ABNORMAL LOW (ref 8.9–10.3)
Chloride: 102 mmol/L (ref 98–111)
Creatinine, Ser: 1.2 mg/dL (ref 0.61–1.24)
GFR calc Af Amer: >60 mL/min (ref >60)
GFR calc non Af Amer: 58 mL/min — ABNORMAL LOW (ref >60)
Glucose, Bld: 248 mg/dL — ABNORMAL HIGH (ref 70–99)
Potassium: 4.8 mmol/L (ref 3.5–5.1)
Sodium: 138 mmol/L (ref 135–145)
Total Bilirubin: 0.7 mg/dL (ref 0.3–1.2)
Total Protein: 4.9 g/dL — ABNORMAL LOW (ref 6.5–8.1)

## 2019-11-07 LAB — CBC WITH DIFFERENTIAL/PLATELET
Abs Immature Granulocytes: 0.14 10*3/uL — ABNORMAL HIGH (ref 0.00–0.07)
Basophils Absolute: 0 10*3/uL (ref 0.0–0.1)
Basophils Relative: 0 %
Eosinophils Absolute: 0 10*3/uL (ref 0.0–0.5)
Eosinophils Relative: 0 %
HCT: 43.2 % (ref 39.0–52.0)
Hemoglobin: 14.2 g/dL (ref 13.0–17.0)
Immature Granulocytes: 1 %
Lymphocytes Relative: 7 %
Lymphs Abs: 1 10*3/uL (ref 0.7–4.0)
MCH: 30.1 pg (ref 26.0–34.0)
MCHC: 32.9 g/dL (ref 30.0–36.0)
MCV: 91.5 fL (ref 80.0–100.0)
Monocytes Absolute: 0.7 10*3/uL (ref 0.1–1.0)
Monocytes Relative: 5 %
Neutro Abs: 12.3 10*3/uL — ABNORMAL HIGH (ref 1.7–7.7)
Neutrophils Relative %: 87 %
Platelets: 137 10*3/uL — ABNORMAL LOW (ref 150–400)
RBC: 4.72 MIL/uL (ref 4.22–5.81)
RDW: 14.8 % (ref 11.5–15.5)
WBC: 14.2 10*3/uL — ABNORMAL HIGH (ref 4.0–10.5)
nRBC: 0 % (ref 0.0–0.2)

## 2019-11-07 LAB — GLUCOSE, CAPILLARY
Glucose-Capillary: 149 mg/dL — ABNORMAL HIGH (ref 70–99)
Glucose-Capillary: 259 mg/dL — ABNORMAL HIGH (ref 70–99)
Glucose-Capillary: 145 mg/dL — ABNORMAL HIGH (ref 70–99)
Glucose-Capillary: 147 mg/dL — ABNORMAL HIGH (ref 70–99)

## 2019-11-07 LAB — C-REACTIVE PROTEIN: CRP: 0.6 mg/dL (ref <1.0)

## 2019-11-07 LAB — D-DIMER, QUANTITATIVE: D-Dimer, Quant: 1.17 ug/mL-FEU — ABNORMAL HIGH (ref 0.00–0.50)

## 2019-11-07 LAB — FERRITIN: Ferritin: 1407 ng/mL — ABNORMAL HIGH (ref 24–336)

## 2019-11-07 MED ORDER — FUROSEMIDE 10 MG/ML IJ SOLN
20.0000 mg | Freq: Two times a day (BID) | INTRAMUSCULAR | Status: DC
  Administered 2019-11-07: 08:00:00 20 mg via INTRAVENOUS
  Filled 2019-11-07: qty 2

## 2019-11-07 MED ORDER — FUROSEMIDE 10 MG/ML IJ SOLN: 20.0000 mg | Freq: Two times a day (BID) | INTRAMUSCULAR | Status: DC

## 2019-11-07 MED ORDER — RIVAROXABAN 15 MG PO TABS
15.0000 mg | ORAL_TABLET | Freq: Two times a day (BID) | ORAL | Status: DC
  Administered 2019-11-07 – 2019-11-09 (×4): 15 mg via ORAL
  Filled 2019-11-07 (×4): qty 1

## 2019-11-07 NOTE — Progress Notes (Addendum)
PROGRESS NOTE    Walter Baxter  QQV:[id number] DOB: Jul 28, 1941 DOA: 11/05/2019 PCP: Lupita Raider, MD    Brief Narrative:  Patient admitted to the hospital with a working diagnosis of acute hypoxic respiratory failure due to SARS COVID-19 viral pneumonia, complicated by a right lower extremity deep vein thrombosis.  79 year old male who presented with dyspnea.  He does have significant past medical history of type 2 diabetes mellitus, hypertension and obesity.  Patient was diagnosed at [facility] acute hypoxic respiratory failure due to SARS COVID-19 viral pneumonia on 1/28.  He received medical therapy with intravenous steroids, remdesivir and Tocilizumab.  His hospitalization was complicated by a right lower extremity deep vein thrombosis.  Due to persistent hypoxemia he was transferred to [facility] campus February 13.   At the time of transfer his blood pressure was 119/64, pulse rate 79, respiratory rate 20, temperature 97.1, oxygen saturation 94%.  His lungs are clear to auscultation bilaterally, heart S1-S2 present and rhythmic, abdomen soft, no lower extremity edema.  His chest radiograph had a reticular nodular infiltrate at the base of the right upper lobe, bilateral interstitial infiltrates.   Patient has been continued on systemic steroids and supplemental oxygen per nasal cannula.  Very weak and deconditioned, physical therapy/occupational therapy had recommended SNF  Assessment & Plan:   Active Problems:   Acute hypoxemic respiratory failure due to COVID-19 [facility])   Essential hypertension   Hyperlipidemia   Type 2 diabetes mellitus (HCC)   Acute respiratory failure with hypoxia (HCC)   Obesity   Peroneal DVT (deep venous thrombosis), right (HCC)   Acute diastolic CHF (congestive heart failure) (HCC)   1.  Acute hypoxic respiratory failure due to SARS COVID-19 viral pneumonia. Sp Tocilizumab #1, Remdesivir #5/5. Dexamethasone #19.   RR: 19  Pulse oxymetry: 96%  Fi02:  3L/ min per [place]  COVID-19 Labs  Recent Labs    11/06/19 0242 11/07/19 0355  DDIMER 1.45* 1.17*  FERRITIN 1,502* 1,407*  CRP 0.5 0.6    Lab Results  Component Value Date   SARSCOV2NAA POSITIVE (A) 10/20/2019    Echocardiogram with preserved LV and RV systolic function. No significant valvular disease.   Dyspnea slowly improving, patient continue to have oxygen dependence, very weak and deconditioned, he will benefit from SNF.   Continue with antitussive agents, bronchodilators and airway clearing techniques with flutter valve and incentive spirometer.   2. Right lower extremity DVT. Continue anticoagulation with Rivaroxaban, with good toleration.   3. Urinary retention/ UTI due to E coli present on admission. Completed antibiotic therapy, continue with flomax.   4. Obesity. BMI more than 30.   5. T2DM (Hgb A1c 6,1) with steroid induced hyperglycemia/ dyslipdemia. Fasting glucose this am is 248, will continue sliding scale insulin for glucose cover and monitoring. Patient now off steroids. Basal insulin with 10 bid levemir. On linagliptin. Continue pravastatin.   6. HTN. Continue blood pressure control with metoprolol.   7. CKD stage 2. Stable renal function with serum cr at 1,2 with K at 4,8 and serum bicarb at 25.   DVT prophylaxis: rivaroxaban  Code Status:  full Family Communication: I spoke over the phone with the patient's wife about patient's  condition, plan of care, prognosis and all questions were addressed.  Disposition Plan/ discharge barriers: transfer to medical ward,   Subjective: Patient is feeling better, dyspnea has been improving but not yet back to baseline, no nausea or vomiting, no chest pain. Improved appetite.   Objective: Vitals:  11/06/19 2124 11/07/19 0400 11/07/19 0732 11/07/19 1050  BP:  (!) 102/52 119/70   Pulse: 74  62   Resp:   19   Temp:  98.1 F (36.7 C) 98 F (36.7 C)   TempSrc:  Oral Oral   SpO2:   95% 96%  Weight:      Height:         Intake/Output Summary (Last 24 hours) at 11/07/2019 1136 Last data filed at 11/06/2019 2100 Gross per 24 hour  Intake --  Output 550 ml  Net -550 ml   Filed Weights   11/05/19 1516  Weight: 97.8 kg    Examination:   General: Not in pain or dyspnea., deconditioned  Neurology: Awake and alert, non focal  E ENT: mild pallor, no icterus, oral mucosa moist Cardiovascular: No JVD. S1-S2 present, rhythmic, no gallops, rubs, or murmurs. No lower extremity edema. Pulmonary: positive breath sounds bilaterally. Gastrointestinal. Abdomen with no organomegaly, non tender, no rebound or guarding Skin. No rashes Musculoskeletal: no joint deformities     Data Reviewed: I have personally reviewed following labs and imaging studies  CBC: Recent Labs  Lab 11/03/19 0522 11/04/19 0641 11/05/19 0606 11/06/19 0242 11/07/19 0355  WBC 18.5* 14.3* 19.2* 20.3* 14.2*  NEUTROABS  --   --   --  17.7* 12.3*  HGB 15.3 15.5 15.7 14.9 14.2  HCT 45.7 45.8 46.3 44.5 43.2  MCV 88.9 88.6 88.5 89.7 91.5  PLT 159 143* 153 166 137*   Basic Metabolic Panel: Recent Labs  Lab 11/03/19 0522 11/04/19 0641 11/05/19 0606 11/06/19 0242 11/07/19 0355  NA 135 136 137 137 138  K 4.8 4.9 5.2* 4.9 4.8  CL 100 104 104 104 102  CO2 28 23 25 25 25   GLUCOSE 209* 203* 184* 178* 248*  BUN 51* 43* 47* 54* 56*  CREATININE 1.26* 1.08 1.08 1.20 1.20  CALCIUM 8.0* 8.1* 8.1* 8.5* 8.3*   GFR: Estimated Creatinine Clearance: 58.5 mL/min (by C-G formula based on SCr of 1.2 mg/dL). Liver Function Tests: Recent Labs  Lab 11/06/19 0242 11/07/19 0355  AST 26 25  ALT 63* 61*  ALKPHOS 78 76  BILITOT 1.0 0.7  PROT 5.1* 4.9*  ALBUMIN 2.6* 2.7*   No results for input(s): LIPASE, AMYLASE in the last 168 hours. No results for input(s): AMMONIA in the last 168 hours. Coagulation Profile: No results for input(s): INR, PROTIME in the last 168 hours. Cardiac Enzymes: No results for input(s): CKTOTAL, CKMB,  CKMBINDEX, TROPONINI in the last 168 hours. BNP (last 3 results) No results for input(s): PROBNP in the last 8760 hours. HbA1C: No results for input(s): HGBA1C in the last 72 hours. CBG: Recent Labs  Lab 11/06/19 0814 11/06/19 1136 11/06/19 1539 11/06/19 2122 11/07/19 0730  GLUCAP 128* 232* 135* 259* 149*   Lipid Profile: No results for input(s): CHOL, HDL, LDLCALC, TRIG, CHOLHDL, LDLDIRECT in the last 72 hours. Thyroid Function Tests: Recent Labs    11/05/19 0606  TSH 3.187   Anemia Panel: Recent Labs    11/06/19 0242 11/07/19 0355  FERRITIN 1,502* 1,407*      Radiology Studies: I have reviewed all of the imaging during this hospital visit personally     Scheduled Meds: . albuterol  2 puff Inhalation Q6H  . vitamin C  250 mg Oral Daily  . dexamethasone (DECADRON) injection  6 mg Intravenous Q24H  . furosemide  20 mg Intravenous Q12H  . insulin aspart  0-20 Units Subcutaneous TID WC  .  insulin aspart  0-5 Units Subcutaneous QHS  . insulin detemir  10 Units Subcutaneous BID  . linagliptin  5 mg Oral Daily  . metoprolol tartrate  50 mg Oral BID  . pravastatin  40 mg Oral q1800  . rivaroxaban  15 mg Oral BID WC  . [START ON 11/17/2019] rivaroxaban  20 mg Oral Q supper  . tamsulosin  0.4 mg Oral Daily  . zinc sulfate  220 mg Oral Daily   Continuous Infusions:   LOS: 2 days        Jaedyn Lard Gerome Apley, MD

## 2019-11-07 NOTE — Progress Notes (Signed)
Physical Therapy Treatment Patient Details Name: Walter Baxter MRN: [id number] DOB: 10-01-1940 Today's Date: 11/07/2019    History of Present Illness 79 year old male with past medical history of diabetes, diet-controlled plus hypertension and obesity admitted on 1/28 at [facility] for shortness of breath and found to have Covid.  Patient was treated with Actemra, IV remdesivir and steroids.  Hospital course complicated by right lower extremity DVT.  Patient's oxygenation continued to decline and he currently is on 7 L nasal cannula.  He was transferred to [facility] today, 2/13.     PT Comments    Pleasant and cooperative. In [facility] chair when PT arrived in room. He states he was exhausted from "doing a lot this morning already"- but in general that he was beginning to feel better. He was able to briefly stand for improving transfers/postural awareness- but desatted into the 80's- 4 min for recovery to 93%. He agreed to ex format- see details. Practiced IS and Flutter valve unit with good return demo. Reminded and reviewed LE ex and encouraged to perform these at least 1-2 x daily. He should benefit from ongoing PT to further address goals for optimal functional outcomes.   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     Equipment Recommendations  Rolling walker with 5" wheels    Recommendations for Other Services       Precautions / Restrictions Precautions Precautions: Fall;Other (comment) Precaution Comments: Monitor SpO2 Restrictions Other Position/Activity Restrictions: He stated that he was exhausted- that he had "done a lot already this morning" and was in [facility] chair when PT entered room. Was able to perform sit<>stand briefly- 2 minutes- desatted to 82%- repositioned in BS chair to continue to rest.    Mobility  Bed Mobility Overal bed mobility: Needs Assistance                Transfers Overall transfer level:  Needs assistance Equipment used: None Transfers: Sit to/from Stand Sit to Stand: Independent(Did not attempt ambulation this AM- he stated he was exhausted but agreed to ex)         General transfer comment: Did not attempt any ambulation this am- after 2 min of standing to improve postural awareness, he desatted in the 80's and wanted to sit  Ambulation/Gait- did not attempt this am secondary to complaints of being exhausted and when briefly stood x 2 min- desatted into the 80's./                 Stairs             Wheelchair Mobility    Modified Rankin (Stroke Patients Only)       Balance Overall balance assessment: Mild deficits observed, not formally tested Sitting-balance support: Feet supported;No upper extremity supported Sitting balance-Leahy Scale: Good Sitting balance - Comments: NO LOB noted while sitting EOB   Standing balance support: Bilateral upper extremity supported Standing balance-Leahy Scale: Fair Standing balance comment: unsteady primarily r/t fatigue/O2 needs                            Cognition Arousal/Alertness: Awake/alert Behavior During Therapy: WFL for tasks assessed/performed                                   General Comments: A&O      Exercises  General Exercises - Lower Extremity Ankle Circles/Pumps: AROM;Seated Quad Sets: AROM;Seated Short Arc Quad: AROM;Seated Hip ABduction/ADduction: AROM;Seated Hip Flexion/Marching: AROM;Seated Other Exercises Other Exercises: Flutter valve x 10 with min cues on technique. Other Exercises: Incentive spirometer x 5 with min cues on technique. Pulling 750 ML Other Exercises: Has been instructed in quad sets, SLRs, ab-add, and ankle pumps- reminded to do 1-2 x daily for the LE ex and IS/Flutter valve hourly    General Comments General comments (skin integrity, edema, etc.): Needs close monitoring for SPO2 as he tendst to desat and fatigues easily       Pertinent Vitals/Pain Pain Assessment: No/denies pain    Home Living                      Prior Function            PT Goals (current goals can now be found in the care plan section) Acute Rehab PT Goals Patient Stated Goal: to go home PT Goal Formulation: With patient Time For Goal Achievement: 11/20/19 Potential to Achieve Goals: Good Progress towards PT goals: Progressing toward goals(Very slowly, but symptomatically he relates that he is feeling better overall)    Frequency    Min 3X/week      PT Plan      Co-evaluation              AM-PAC PT "6 Clicks" Mobility   Outcome Measure  Help needed turning from your back to your side while in a flat bed without using bedrails?: A Little Help needed moving from lying on your back to sitting on the side of a flat bed without using bedrails?: A Little Help needed moving to and from a bed to a chair (including a wheelchair)?: A Little Help needed standing up from a chair using your arms (e.g., wheelchair or bedside chair)?: A Little Help needed to walk in hospital room?: A Lot Help needed climbing 3-5 steps with a railing? : A Lot 6 Click Score: 16    End of Session Equipment Utilized During Treatment: Oxygen Activity Tolerance: Patient limited by fatigue Patient left: in chair;with call bell/phone within reach;with chair alarm set Nurse Communication: Mobility status PT Visit Diagnosis: Difficulty in walking, not elsewhere classified (R26.2);Muscle weakness (generalized) (M62.81)     Time: 1610-9604 PT Time Calculation (min) (ACUTE ONLY): 32 min  Charges:  $Therapeutic Exercise: 23-37 mins                     Harriett Rush, PT # [phone] CGV cell  Ephriam Jenkins 11/07/2019, 11:00 AM

## 2019-11-08 DIAGNOSIS — N182 Chronic kidney disease, stage 2 (mild): Secondary | ICD-10-CM

## 2019-11-08 DIAGNOSIS — E1122 Type 2 diabetes mellitus with diabetic chronic kidney disease: Secondary | ICD-10-CM

## 2019-11-08 DIAGNOSIS — E785 Hyperlipidemia, unspecified: Secondary | ICD-10-CM

## 2019-11-08 LAB — D-DIMER, QUANTITATIVE: D-Dimer, Quant: 1.04 ug/mL-FEU — ABNORMAL HIGH (ref 0.00–0.50)

## 2019-11-08 LAB — CBC WITH DIFFERENTIAL/PLATELET
Abs Immature Granulocytes: 0.25 10*3/uL — ABNORMAL HIGH (ref 0.00–0.07)
Basophils Absolute: 0.1 10*3/uL (ref 0.0–0.1)
Basophils Relative: 0 %
Eosinophils Absolute: 0.3 10*3/uL (ref 0.0–0.5)
Eosinophils Relative: 2 %
HCT: 45.7 % (ref 39.0–52.0)
Hemoglobin: 15.1 g/dL (ref 13.0–17.0)
Immature Granulocytes: 2 %
Lymphocytes Relative: 23 %
Lymphs Abs: 3.5 10*3/uL (ref 0.7–4.0)
MCH: 30.1 pg (ref 26.0–34.0)
MCHC: 33 g/dL (ref 30.0–36.0)
MCV: 91 fL (ref 80.0–100.0)
Monocytes Absolute: 1.4 10*3/uL — ABNORMAL HIGH (ref 0.1–1.0)
Monocytes Relative: 10 %
Neutro Abs: 9.4 10*3/uL — ABNORMAL HIGH (ref 1.7–7.7)
Neutrophils Relative %: 63 %
Platelets: 145 10*3/uL — ABNORMAL LOW (ref 150–400)
RBC: 5.02 MIL/uL (ref 4.22–5.81)
RDW: 15 % (ref 11.5–15.5)
WBC: 14.9 10*3/uL — ABNORMAL HIGH (ref 4.0–10.5)
nRBC: 0 % (ref 0.0–0.2)

## 2019-11-08 LAB — COMPREHENSIVE METABOLIC PANEL
ALT: 76 U/L — ABNORMAL HIGH (ref 0–44)
AST: 33 U/L (ref 15–41)
Albumin: 2.8 g/dL — ABNORMAL LOW (ref 3.5–5.0)
Alkaline Phosphatase: 68 U/L (ref 38–126)
Anion gap: 12 (ref 5–15)
BUN: 51 mg/dL — ABNORMAL HIGH (ref 8–23)
CO2: 27 mmol/L (ref 22–32)
Calcium: 8.6 mg/dL — ABNORMAL LOW (ref 8.9–10.3)
Chloride: 100 mmol/L (ref 98–111)
Creatinine, Ser: 1.07 mg/dL (ref 0.61–1.24)
GFR calc Af Amer: >60 mL/min (ref >60)
GFR calc non Af Amer: >60 mL/min (ref >60)
Glucose, Bld: 64 mg/dL — ABNORMAL LOW (ref 70–99)
Potassium: 4.1 mmol/L (ref 3.5–5.1)
Sodium: 139 mmol/L (ref 135–145)
Total Bilirubin: 0.8 mg/dL (ref 0.3–1.2)
Total Protein: 5.1 g/dL — ABNORMAL LOW (ref 6.5–8.1)

## 2019-11-08 LAB — GLUCOSE, CAPILLARY
Glucose-Capillary: 74 mg/dL (ref 70–99)
Glucose-Capillary: 153 mg/dL — ABNORMAL HIGH (ref 70–99)
Glucose-Capillary: 92 mg/dL (ref 70–99)
Glucose-Capillary: 176 mg/dL — ABNORMAL HIGH (ref 70–99)

## 2019-11-08 LAB — C-REACTIVE PROTEIN: CRP: 0.5 mg/dL (ref <1.0)

## 2019-11-08 LAB — FERRITIN: Ferritin: 1454 ng/mL — ABNORMAL HIGH (ref 24–336)

## 2019-11-08 MED ORDER — RIVAROXABAN (XARELTO) VTE STARTER PACK (15 & 20 MG): ORAL_TABLET | ORAL | 0 refills | Status: AC

## 2019-11-08 MED ORDER — TAMSULOSIN HCL 0.4 MG PO CAPS: 0.4000 mg | ORAL_CAPSULE | Freq: Every day | ORAL | 0 refills | Status: AC

## 2019-11-08 NOTE — Progress Notes (Signed)
Ambulated in room with patient:   On  3L [place], patient 02 levels were 85-88%. Increased O2 rate to 4L with some improvement of 90%.  Patient didn't become distressed, but does feel weak while walking.   At rest 02 levels quickly recovered to 94-95% on 3L [place].

## 2019-11-08 NOTE — Discharge Summary (Signed)
Physician Discharge Summary  [name] XBD:[id number] DOB: May 19, 1941 DOA: 11/05/2019  PCP: Mayra Neer, MD  Admit date: 11/05/2019 Discharge date: 11/08/2019  Admitted From: Home  Disposition:  Home   Recommendations for Outpatient Follow-up and new medication changes:  1. Follow up with Dr. Brigitte Pulse in 2 weeks.   Home Health: yes   Equipment/Devices: Home 02, rolling walker.   Discharge Condition: stable  CODE STATUS: full  Diet recommendation: heart healthy   Brief/Interim Summary: Patient admitted to the hospital with a working diagnosis of acute hypoxic respiratory failure due to SARS COVID-19 viral pneumonia, complicated by a right lower extremity deep vein thrombosis.  79 year old male who presented with dyspnea.  He does have significant past medical history of type 2 diabetes mellitus, hypertension and obesity.  Patient was diagnosed at [name] acute hypoxic respiratory failure due to SARS COVID-19 viral pneumonia on 1/28.  He received medical therapy with intravenous steroids, remdesivir and Tocilizumab.  His hospitalization was complicated by a right lower extremity deep vein thrombosis.  Due to persistent hypoxemia he was transferred to [place] February 13.   At the time of transfer his blood pressure was 119/64, pulse rate 79, respiratory rate 20, temperature 97.1, oxygen saturation 94%.  His lungs were clear to auscultation bilaterally, heart S1-S2 present and rhythmic, abdomen soft, no lower extremity edema.  His chest radiograph had a reticular nodular infiltrate at the base of the right upper lobe, bilateral interstitial infiltrates.   Patient has been continued on systemic steroids and supplemental oxygen per nasal cannula.  Very weak and deconditioned, physical therapy/occupational therapy had recommended SNF, he declined and his wife declined SNF and patient was discharged home with home health.   1.  Acute hypoxic respiratory failure due to SARS  COVID-19 viral pneumonia.  Patient was admitted to the medical ward, he received supplemental oxygen per nasal cannula, medical therapy with remdesivir, dexamethasone and 1 dose of Tocilizumab.  He was treated with antitussive agents, bronchodilators and airway clearing techniques with incentive spirometer and flutter valve. Patient symptoms and inflammatory markers improved, at discharge his oxygen saturation is 90 to 93% on 3 L of supplemental oxygen per nasal cannula.  2.  Right lower extremity deep vein thrombosis.  Patient underwent further work-up with CT chest which was negative for pulmonary embolism, ultrasonography of the lower extremities was positive for deep vein thrombosis about right posterior tibial and peroneal veins.  Negative DVT at the left lower extremity.  Patient will continue anticoagulation with rivaroxaban.  3.  Urinary retention, urinary tract infection due to E. coli, present on admission.  Patient received antibiotic therapy with good toleration.  He has been started on Flomax.  4.  Type 2 diabetes mellitus, hemoglobin A1c 6.1 with steroid-induced hyperglycemia.  Dyslipidemia.  Patient received insulin sliding scale while hospitalized, along with basal insulin with good toleration.  At discharge patient will resume his home regimen.  5.  Chronic kidney disease stage II.  His kidney function was closely monitored during his hospitalization.  Discharge creatinine 1.0, sodium 139, potassium 4.1, chloride 100, bicarb 27 and BUN 51.   6.  Hypertension.  Continue blood pressure control with metoprolol.  7.  Obesity.  Calculated BMI 30.    Discharge Diagnoses:  Active Problems:   Acute hypoxemic respiratory failure due to COVID-19 [facility])   Essential hypertension   Hyperlipidemia   Type 2 diabetes mellitus ([place])   Acute respiratory failure with hypoxia (HCC)   Obesity   Peroneal DVT (  deep venous thrombosis), right (HCC)   Acute diastolic CHF (congestive heart failure)  (HCC)    Discharge Instructions   Allergies as of 11/08/2019      Reactions   Other Rash   Magic mouth wash       Medication List    TAKE these medications   acetaminophen 500 MG tablet Commonly known as: TYLENOL Take 1,000 mg by mouth every 6 (six) hours as needed for mild pain or headache.   aspirin EC 81 MG tablet Take 81 mg by mouth daily.   famotidine 20 MG tablet Commonly known as: PEPCID Take 20 mg by mouth 2 (two) times daily.   guaiFENesin 600 MG 12 hr tablet Commonly known as: MUCINEX Take 600 mg by mouth 2 (two) times daily as needed for cough or to loosen phlegm.   metoprolol tartrate 50 MG tablet Commonly known as: LOPRESSOR Take 50 mg by mouth 2 (two) times daily.   montelukast 10 MG tablet Commonly known as: SINGULAIR Take 10 mg by mouth at bedtime.   promethazine-codeine 6.25-10 MG/5ML syrup Commonly known as: PHENERGAN with CODEINE Take 5 mLs by mouth every 6 (six) hours as needed for cough.   Rivaroxaban 15 & 20 MG Tbpk Follow package directions: Take one 15mg  tablet by mouth twice a day. On day 22, switch to one 20mg  tablet once a day. Take with food.   tamsulosin 0.4 MG Caps capsule Commonly known as: FLOMAX Take 1 capsule (0.4 mg total) by mouth daily.            Durable Medical Equipment  (From admission, onward)         Start     Ordered   11/08/19 0000  For home use only DME oxygen    Question Answer Comment  Length of Need 6 Months   Mode or (Route) Nasal cannula   Liters per Minute 4   Frequency Continuous (stationary and portable oxygen unit needed)   Oxygen conserving device Yes   Oxygen delivery system Gas      11/08/19 1010   Unscheduled  For home use only DME Walker rolling  Once    Question Answer Comment  Walker: With 5 Inch Wheels   Patient needs a walker to [facility] with the following condition Ambulatory dysfunction      11/08/19 1010         Follow-up Information    [name], [name], MD Follow up in 2  week(s).   Specialty: Family Medicine Contact information: [address] [place] [id number] [phone]          Allergies  Allergen Reactions  . Other Rash    Magic mouth wash       Procedures/Studies: CT ANGIO CHEST PE W OR WO CONTRAST  Result Date: 10/26/2019 CLINICAL DATA:  Shortness of breath, COVID positive EXAM: CT ANGIOGRAPHY CHEST WITH CONTRAST TECHNIQUE: Multidetector CT imaging of the chest was performed using the standard protocol during bolus administration of intravenous contrast. Multiplanar CT image reconstructions and MIPs were obtained to evaluate the vascular anatomy. CONTRAST:  75mL OMNIPAQUE IOHEXOL 350 MG/ML SOLN COMPARISON:  None. FINDINGS: Cardiovascular: Satisfactory opacification of the pulmonary arteries to the proximal segmental level. No evidence of pulmonary embolism. Normal heart size. No pericardial effusion. Coronary artery and thoracic aorta atherosclerotic calcification. Mediastinum/Nodes: No mediastinal, hilar, or axillary adenopathy. Thyroid is unremarkable. Esophagus appears normal. Lungs/Pleura: There is multifocal consolidation greatest at the periphery and lung bases. No pleural effusion or pneumothorax.  Upper Abdomen: No acute abnormality. Musculoskeletal: No chest wall abnormality. No acute or significant osseous findings. Review of the MIP images confirms the above findings. IMPRESSION: No evidence of acute pulmonary embolism. Bilateral consolidative opacities likely reflecting pneumonia (including COVID-19). Aortic Atherosclerosis (ICD10-I70.0). Electronically Signed   By: Guadlupe Spanish M.D.   On: 10/26/2019 17:03   US Venous Img Lower Bilateral (DVT)  Result Date: 10/26/2019 CLINICAL DATA:  Lower extremity edema for 1 day EXAM: BILATERAL LOWER EXTREMITY VENOUS DOPPLER ULTRASOUND TECHNIQUE: Gray-scale sonography with graded compression, as well as color Doppler and duplex ultrasound were performed to evaluate the lower  extremity deep venous systems from the level of the common femoral vein and including the common femoral, femoral, profunda femoral, popliteal and calf veins including the posterior tibial, peroneal and gastrocnemius veins when visible. The superficial great saphenous vein was also interrogated. Spectral Doppler was utilized to evaluate flow at rest and with distal augmentation maneuvers in the common femoral, femoral and popliteal veins. COMPARISON:  None. FINDINGS: RIGHT LOWER EXTREMITY Common Femoral Vein: No evidence of thrombus. Normal compressibility, respiratory phasicity and response to augmentation. Saphenofemoral Junction: No evidence of thrombus. Normal compressibility and flow on color Doppler imaging. Profunda Femoral Vein: No evidence of thrombus. Normal compressibility and flow on color Doppler imaging. Femoral Vein: No evidence of thrombus. Normal compressibility, respiratory phasicity and response to augmentation. Popliteal Vein: No evidence of thrombus. Normal compressibility, respiratory phasicity and response to augmentation. Calf Veins: Calf posterior tibial and peroneal veins demonstrate intraluminal thrombus appearing occlusive in the posterior tibial vein and nonocclusive in the peroneal vein. Vessels are noncompressible. Very low thrombus burden. LEFT LOWER EXTREMITY Common Femoral Vein: No evidence of thrombus. Normal compressibility, respiratory phasicity and response to augmentation. Saphenofemoral Junction: No evidence of thrombus. Normal compressibility and flow on color Doppler imaging. Profunda Femoral Vein: No evidence of thrombus. Normal compressibility and flow on color Doppler imaging. Femoral Vein: No evidence of thrombus. Normal compressibility, respiratory phasicity and response to augmentation. Popliteal Vein: No evidence of thrombus. Normal compressibility, respiratory phasicity and response to augmentation. Calf Veins: No evidence of thrombus. Normal compressibility and flow  on color Doppler imaging. IMPRESSION: Positive exam for calf region right posterior tibial and peroneal vein DVT. Very low thrombus burden. No propagation into the popliteal or femoral veins. Negative for left lower extremity DVT. Electronically Signed   By: Judie Petit.  Shick M.D.   On: 10/26/2019 11:54   DG Chest Port 1 View  Result Date: 11/05/2019 CLINICAL DATA:  Pulmonary disease. Shortness of breath, generalized weakness. EXAM: PORTABLE CHEST 1 VIEW COMPARISON:  Chest x-rays dated 11/03/2019, 10/30/2019 and 10/20/2019. FINDINGS: No significant change of the opacities along the periphery of the RIGHT mid and lower lung zones. Similar milder changes along the periphery of the LEFT mid and lower lung zones. These opacities were previously described as bilateral multifocal pneumonia. No new lung findings. No pleural effusion or pneumothorax is seen. Stable borderline cardiomegaly. IMPRESSION: Stable appearance of the bilateral multifocal pneumonia. Electronically Signed   By: Bary Richard M.D.   On: 11/05/2019 10:41   DG Chest Port 1 View  Result Date: 11/03/2019 CLINICAL DATA:  Shortness of breath. EXAM: PORTABLE CHEST 1 VIEW COMPARISON:  October 30, 2019. FINDINGS: Stable cardiomediastinal silhouette. No pneumothorax or pleural effusion is noted. Stable bilateral patchy airspace opacities are noted, right greater than left, consistent with multifocal pneumonia. Bony thorax is unremarkable. IMPRESSION: Stable bilateral lung opacities are noted, right greater than left, consistent with multifocal pneumonia. Electronically  Signed   By: Lupita Raider M.D.   On: 11/03/2019 08:19   DG Chest Port 1 View  Result Date: 10/30/2019 CLINICAL DATA:  Shortness of breath. EXAM: PORTABLE CHEST 1 VIEW COMPARISON:  October 28, 2019. FINDINGS: Stable cardiomediastinal silhouette. No pneumothorax or pleural effusion is noted. Stable bilateral patchy airspace opacities are noted consistent with multifocal pneumonia. Bony thorax  is unremarkable. IMPRESSION: Stable bilateral multifocal pneumonia. Electronically Signed   By: Lupita Raider M.D.   On: 10/30/2019 09:02   DG Chest Port 1 View  Result Date: 10/28/2019 CLINICAL DATA:  79 year old male with pneumonia. EXAM: PORTABLE CHEST 1 VIEW COMPARISON:  Chest radiograph dated 10/24/2019 and CT dated 10/26/2019. FINDINGS: Bilateral mid to lower lung field subpleural and streaky densities appear similar or slightly improved since the prior radiograph. No large pleural effusion or pneumothorax. Stable cardiomediastinal silhouette. Atherosclerotic calcification of the aorta. No acute osseous pathology. IMPRESSION: Similar or slightly improved pulmonary opacities. Continued follow-up recommended. Electronically Signed   By: Elgie Collard M.D.   On: 10/28/2019 20:37   DG Chest Port 1 View  Result Date: 10/24/2019 CLINICAL DATA:  Shortness of breath. EXAM: PORTABLE CHEST 1 VIEW COMPARISON:  October 20, 2019. FINDINGS: Stable cardiomediastinal silhouette. Atherosclerosis of thoracic aorta is noted. No pneumothorax or pleural effusion is noted. Mildly increased bilateral patchy ill-defined airspace opacities are noted consistent with multifocal pneumonia. Bony thorax is unremarkable. IMPRESSION: Aortic atherosclerosis. Mildly increased bilateral patchy airspace opacities are noted consistent with multifocal pneumonia. Electronically Signed   By: Lupita Raider M.D.   On: 10/24/2019 08:08   DG Chest Port 1 View  Result Date: 10/20/2019 CLINICAL DATA:  Shortness of breath, COVID exposure. EXAM: PORTABLE CHEST 1 VIEW COMPARISON:  Chest radiograph dated 02/10/2011. FINDINGS: The heart appears enlarged. Vascular calcifications are seen in the aortic arch. Moderate airspace opacities are seen in the peripheral right mid lung and in the left lower lung. There is no pleural effusion or pneumothorax. The osseous structures are intact. IMPRESSION: Moderate airspace opacities in the peripheral  right mid lung and left lower lung may reflect COVID-19 pneumonia. Electronically Signed   By: Romona Curls M.D.   On: 10/20/2019 18:11   ECHOCARDIOGRAM COMPLETE  Result Date: 11/06/2019    ECHOCARDIOGRAM REPORT   Patient Name:   Walter [facility] Date of Exam: 11/06/2019 Medical Rec #:  [id number]        Height:       68.0 in Accession #:    [id number]       Weight:       215.6 lb Date of Birth:  Feb 06, 1941        BSA:          2.11 m Patient Age:    78 years         BP:           113/64 mmHg Patient Gender: M                HR:           61 bpm. Exam Location:  Inpatient Procedure: 2D Echo, Cardiac Doppler and Color Doppler Indications:    I50.40* Unspecified combined systolic (congestive) and diastolic                 (congestive) heart failure  History:        Patient has no prior history of Echocardiogram examinations.  Risk Factors:Hypertension, Diabetes and Dyslipidemia. Covid 19                 positive. Hypoxia.  Sonographer:    Sheralyn Boatman RDCS Referring Phys: [id number] SENDIL K [facility] IMPRESSIONS  1. Left ventricular ejection fraction, by estimation, is 70 to 75%. The left ventricle has hyperdynamic function. The left ventricle has no regional wall motion abnormalities. Left ventricular diastolic parameters are consistent with Grade I diastolic dysfunction (impaired relaxation).  2. Right ventricular systolic function is normal. The right ventricular size is normal. There is mildly elevated pulmonary artery systolic pressure.  3. The mitral valve is normal in structure and function. No evidence of mitral valve regurgitation. No evidence of mitral stenosis.  4. The aortic valve is normal in structure and function. Aortic valve regurgitation is not visualized. No aortic stenosis is present.  5. The inferior vena cava is normal in size with <50% respiratory variability, suggesting right atrial pressure of 8 mmHg. Comparison(s): No prior Echocardiogram. FINDINGS  Left Ventricle: Left ventricular  ejection fraction, by estimation, is 70 to 75%. The left ventricle has hyperdynamic function. The left ventricle has no regional wall motion abnormalities. The left ventricular internal cavity size was normal in size. There is no left ventricular hypertrophy. Left ventricular diastolic parameters are consistent with Grade I diastolic dysfunction (impaired relaxation). Indeterminate filling pressures. Right Ventricle: The right ventricular size is normal. No increase in right ventricular wall thickness. Right ventricular systolic function is normal. There is mildly elevated pulmonary artery systolic pressure. The tricuspid regurgitant velocity is 2.39  m/s, and with an assumed right atrial pressure of 8 mmHg, the estimated right ventricular systolic pressure is 30.8 mmHg. Left Atrium: Left atrial size was normal in size. Right Atrium: Right atrial size was normal in size. Pericardium: There is no evidence of pericardial effusion. Mitral Valve: The mitral valve is normal in structure and function. Normal mobility of the mitral valve leaflets. No evidence of mitral valve regurgitation. No evidence of mitral valve stenosis. Tricuspid Valve: The tricuspid valve is normal in structure. Tricuspid valve regurgitation is trivial. No evidence of tricuspid stenosis. Aortic Valve: The aortic valve is normal in structure and function. Aortic valve regurgitation is not visualized. No aortic stenosis is present. Pulmonic Valve: The pulmonic valve was normal in structure. Pulmonic valve regurgitation is not visualized. No evidence of pulmonic stenosis. Aorta: The aortic root is normal in size and structure. Venous: The inferior vena cava is normal in size with less than 50% respiratory variability, suggesting right atrial pressure of 8 mmHg. IAS/Shunts: No atrial level shunt detected by color flow Doppler.  LEFT VENTRICLE PLAX 2D LVIDd:         4.27 cm     Diastology LVIDs:         2.33 cm     LV e' lateral:   5.22 cm/s LV PW:          1.38 cm     LV E/e' lateral: 11.7 LV IVS:        1.26 cm     LV e' medial:    4.89 cm/s LVOT diam:     1.70 cm     LV E/e' medial:  12.5 LV SV:         36.32 ml LV SV Index:   28.64 LVOT Area:     2.27 cm  LV Volumes (MOD) LV vol d, MOD A2C: 34.3 ml LV vol d, MOD A4C: 76.3 ml LV vol s, MOD  A2C: 9.9 ml LV vol s, MOD A4C: 17.7 ml LV SV MOD A2C:     24.4 ml LV SV MOD A4C:     76.3 ml LV SV MOD BP:      41.5 ml RIGHT VENTRICLE             IVC RV S prime:     10.10 cm/s  IVC diam: 1.77 cm TAPSE (M-mode): 2.0 cm LEFT ATRIUM             Index       RIGHT ATRIUM           Index LA diam:        4.50 cm 2.13 cm/m  RA Area:     12.60 cm LA Vol (A2C):   23.3 ml 11.04 ml/m RA Volume:   28.00 ml  13.27 ml/m LA Vol (A4C):   47.7 ml 22.61 ml/m LA Biplane Vol: 34.6 ml 16.40 ml/m  AORTIC VALVE LVOT Vmax:   89.20 cm/s LVOT Vmean:  62.600 cm/s LVOT VTI:    0.160 m  AORTA Ao Root diam: 3.30 cm Ao Asc diam:  3.20 cm MITRAL VALVE               TRICUSPID VALVE MV Area (PHT): 3.27 cm    TR Peak grad:   22.8 mmHg MV Decel Time: 232 msec    TR Vmax:        239.00 cm/s MV E velocity: 61.10 cm/s MV A velocity: 86.30 cm/s  SHUNTS MV E/A ratio:  0.71        Systemic VTI:  0.16 m                            Systemic Diam: 1.70 cm Rachelle HoraMihai Croitoru MD Electronically signed by Thurmon FairMihai Croitoru MD Signature Date/Time: 11/06/2019/4:35:28 PM    Final       Subjective: Patient is feeling better, dyspnea has been improving, no nausea or vomiting, no chest pain. Patient continue to decline SNF, he wishes to go home with his wife. He has accepted home health services.   Discharge Exam: Vitals:   11/08/19 0358 11/08/19 0731  BP: (!) 122/58   Pulse: 67   Resp: 18   Temp: 97.8 F (36.6 C) 97.8 F (36.6 C)  SpO2: (!) 89%    Vitals:   11/07/19 1935 11/08/19 0358 11/08/19 0359 11/08/19 0731  BP: 124/73 (!) 122/58    Pulse: 83 67    Resp:  18    Temp: 98 F (36.7 C) 97.8 F (36.6 C)  97.8 F (36.6 C)  TempSrc: Oral Oral  Oral   SpO2: 90% (!) 89%    Weight:   82.1 kg   Height:        General: Not in pain or dyspnea.  Neurology: Awake and alert, non focal  E ENT: no pallor, no icterus, oral mucosa moist Cardiovascular: No JVD. S1-S2 present, rhythmic, no gallops, rubs, or murmurs. No lower extremity edema. Pulmonary: positive breath sounds bilaterally. Gastrointestinal. Abdomen with no organomegaly, non tender, no rebound or guarding Skin. No rashes Musculoskeletal: no joint deformities   The results of significant diagnostics from this hospitalization (including imaging, microbiology, ancillary and laboratory) are listed below for reference.     Microbiology: Recent Results (from the past 240 hour(s))  Urine Culture     Status: Abnormal   Collection Time: 10/30/19 11:14 AM   Specimen: Urine, Random  Result Value Ref Range Status   Specimen Description   Final    URINE, RANDOM Performed at [facility], [address] [place], [place] [address]    Special Requests   Final    NONE Performed at [facility], [address] [place], [place] [address]    Culture >=100,000 COLONIES/mL ESCHERICHIA COLI (A)  Final   Report Status 11/01/2019 FINAL  Final   Organism ID, Bacteria ESCHERICHIA COLI (A)  Final      Susceptibility   Escherichia coli - MIC*    AMPICILLIN >=32 RESISTANT Resistant     CEFAZOLIN <=4 SENSITIVE Sensitive     CEFTRIAXONE <=0.25 SENSITIVE Sensitive     CIPROFLOXACIN >=4 RESISTANT Resistant     GENTAMICIN <=1 SENSITIVE Sensitive     IMIPENEM <=0.25 SENSITIVE Sensitive     NITROFURANTOIN <=16 SENSITIVE Sensitive     TRIMETH/SULFA <=20 SENSITIVE Sensitive     AMPICILLIN/SULBACTAM >=32 RESISTANT Resistant     PIP/TAZO <=4 SENSITIVE Sensitive     * >=100,000 COLONIES/mL ESCHERICHIA COLI  MRSA PCR Screening     Status: None   Collection Time: 10/30/19 11:30 AM   Specimen: Nasal Mucosa; Nasopharyngeal  Result Value Ref Range Status   MRSA by PCR NEGATIVE  NEGATIVE Final    Comment:        The GeneXpert MRSA Assay (FDA approved for NASAL specimens only), is one component of a comprehensive MRSA colonization surveillance program. It is not intended to diagnose MRSA infection nor to guide or monitor treatment for MRSA infections. Performed at [facility], [address] [place], [place] [address]      Labs: BNP (last 3 results) Recent Labs    10/20/19 1738 11/06/19 0242  BNP 179.0* 85.2   Basic Metabolic Panel: Recent Labs  Lab 11/04/19 0641 11/05/19 0606 11/06/19 0242 11/07/19 0355 11/08/19 0320  NA 136 137 137 138 139  K 4.9 5.2* 4.9 4.8 4.1  CL 104 104 104 102 100  CO2 23 25 25 25 27   GLUCOSE 203* 184* 178* 248* 64*  BUN 43* 47* 54* 56* 51*  CREATININE 1.08 1.08 1.20 1.20 1.07  CALCIUM 8.1* 8.1* 8.5* 8.3* 8.6*   Liver Function Tests: Recent Labs  Lab 11/06/19 0242 11/07/19 0355 11/08/19 0320  AST 26 25 33  ALT 63* 61* 76*  ALKPHOS 78 76 68  BILITOT 1.0 0.7 0.8  PROT 5.1* 4.9* 5.1*  ALBUMIN 2.6* 2.7* 2.8*   No results for input(s): LIPASE, AMYLASE in the last 168 hours. No results for input(s): AMMONIA in the last 168 hours. CBC: Recent Labs  Lab 11/04/19 0641 11/05/19 0606 11/06/19 0242 11/07/19 0355 11/08/19 0320  WBC 14.3* 19.2* 20.3* 14.2* 14.9*  NEUTROABS  --   --  17.7* 12.3* 9.4*  HGB 15.5 15.7 14.9 14.2 15.1  HCT 45.8 46.3 44.5 43.2 45.7  MCV 88.6 88.5 89.7 91.5 91.0  PLT 143* 153 166 137* 145*   Cardiac Enzymes: No results for input(s): CKTOTAL, CKMB, CKMBINDEX, TROPONINI in the last 168 hours. BNP: Invalid input(s): POCBNP CBG: Recent Labs  Lab 11/07/19 0730 11/07/19 1136 11/07/19 1618 11/07/19 1933 11/08/19 0731  GLUCAP 149* 259* 145* 147* 74   D-Dimer Recent Labs    11/07/19 0355 11/08/19 0320  DDIMER 1.17* 1.04*   Hgb A1c No results for input(s): HGBA1C in the last 72 hours. Lipid Profile No results for input(s): CHOL, HDL, LDLCALC, TRIG, CHOLHDL,  LDLDIRECT in the last 72 hours. Thyroid function  studies No results for input(s): TSH, T4TOTAL, T3FREE, THYROIDAB in the last 72 hours.  Invalid input(s): FREET3 Anemia work up Recent Labs    11/07/19 0355 11/08/19 0320  FERRITIN 1,407* 1,454*   Urinalysis    Component Value Date/Time   COLORURINE YELLOW (A) 10/30/2019 1018   APPEARANCEUR CLEAR (A) 10/30/2019 1018   LABSPEC 1.026 10/30/2019 1018   PHURINE 5.0 10/30/2019 1018   GLUCOSEU 50 (A) 10/30/2019 1018   HGBUR NEGATIVE 10/30/2019 1018   BILIRUBINUR NEGATIVE 10/30/2019 1018   KETONESUR NEGATIVE 10/30/2019 1018   PROTEINUR NEGATIVE 10/30/2019 1018   NITRITE NEGATIVE 10/30/2019 1018   LEUKOCYTESUR SMALL (A) 10/30/2019 1018   Sepsis Labs Invalid input(s): PROCALCITONIN,  WBC,  LACTICIDVEN Microbiology Recent Results (from the past 240 hour(s))  Urine Culture     Status: Abnormal   Collection Time: 10/30/19 11:14 AM   Specimen: Urine, Random  Result Value Ref Range Status   Specimen Description   Final    URINE, RANDOM Performed at [facility], [address] [place], [place] [address]    Special Requests   Final    NONE Performed at [facility], [address] [place], [place] [address]    Culture >=100,000 COLONIES/mL ESCHERICHIA COLI (A)  Final   Report Status 11/01/2019 FINAL  Final   Organism ID, Bacteria ESCHERICHIA COLI (A)  Final      Susceptibility   Escherichia coli - MIC*    AMPICILLIN >=32 RESISTANT Resistant     CEFAZOLIN <=4 SENSITIVE Sensitive     CEFTRIAXONE <=0.25 SENSITIVE Sensitive     CIPROFLOXACIN >=4 RESISTANT Resistant     GENTAMICIN <=1 SENSITIVE Sensitive     IMIPENEM <=0.25 SENSITIVE Sensitive     NITROFURANTOIN <=16 SENSITIVE Sensitive     TRIMETH/SULFA <=20 SENSITIVE Sensitive     AMPICILLIN/SULBACTAM >=32 RESISTANT Resistant     PIP/TAZO <=4 SENSITIVE Sensitive     * >=100,000 COLONIES/mL ESCHERICHIA COLI  MRSA PCR Screening     Status: None   Collection  Time: 10/30/19 11:30 AM   Specimen: Nasal Mucosa; Nasopharyngeal  Result Value Ref Range Status   MRSA by PCR NEGATIVE NEGATIVE Final    Comment:        The GeneXpert MRSA Assay (FDA approved for NASAL specimens only), is one component of a comprehensive MRSA colonization surveillance program. It is not intended to diagnose MRSA infection nor to guide or monitor treatment for MRSA infections. Performed at [facility], [address], [place], [place] [address]      Time coordinating discharge: 45 minutes  SIGNED:   Coralie Keens, MD  [facility] 11/08/2019, 8:25 AM

## 2019-11-08 NOTE — Care Management Important Message (Signed)
Important Message  Patient Details  Name: Walter Baxter MRN: [id number] Date of Birth: Jun 25, 1941   Medicare Important Message Given:  Yes - Important Message mailed due to current National Emergency   Verbal consent obtained due to current National Emergency  Relationship to patient: Spouse/Significant Other Contact Name: Zekiah Caruth Call Date: 11/08/19  Time: 1503 Phone: [phone] Outcome: Spoke with contact Important Message mailed to: Patient address on file      [name] 11/08/2019, 3:04 PM

## 2019-11-08 NOTE — Progress Notes (Signed)
Physical Therapy Treatment Patient Details Name: Walter Baxter MRN: [id number] DOB: 11/25/1940 Today's Date: 11/08/2019    History of Present Illness 79-year-old male with past medical history of diabetes, diet-controlled plus hypertension and obesity admitted on 1/28 at [facility] for shortness of breath and found to have Covid.  Patient was treated with Actemra, IV remdesivir and steroids.  Hospital course complicated by right lower extremity DVT.  Patient's oxygenation continued to decline and he currently is on 7 L nasal cannula.  He was transferred to [facility] today, 2/13.     PT Comments    Patient in BS chair, having packed his belongings with assistance from nursing. He was trying to get in touch with someone to come and pick him up. He relates that he has had help from family and friends to care for his ailing spouse at home. He is her primary caregiver. He will need O2 at home, but essentially has met all PT goals. Reviewed HEP and reminded to continue the IS, Flutter valve unit, UE/LE and theraband ex for optimal functional outcomes. Should he not be discharged, will continue to check and monitor him with focus on mobility and max strengthening and endurance.   Follow Up Recommendations  SNF;No PT follow up(He states he is going home, and he has improved significantly)     Equipment Recommendations  Rolling walker with 5" wheels    Recommendations for Other Services       Precautions / Restrictions Precautions Precautions: Fall Precaution Comments: Monitor SpO2 Restrictions Weight Bearing Restrictions: No    Mobility  Bed Mobility Overal bed mobility: Needs Assistance Bed Mobility: Supine to Sit Rolling: Supervision Sidelying to sit: Supervision;HOB elevated Supine to sit: HOB elevated;Supervision Sit to supine: Min assist   General bed mobility comments: Up in chair(He had been ambulating a lot today- was anticipating discharge and was trying to connect with someone  that could come and pick him up. He says friends and family have been caring for his wife in his absence as she is sick at home.)  Transfers Overall transfer level: Needs assistance Equipment used: Rolling walker (2 wheeled) Transfers: Sit to/from Stand Sit to Stand: Modified independent (Device/Increase time)            Ambulation/Gait                 Stairs             Wheelchair Mobility    Modified Rankin (Stroke Patients Only)       Balance Overall balance assessment: Mild deficits observed, not formally tested Sitting-balance support: Feet supported;No upper extremity supported Sitting balance-Leahy Scale: Good Sitting balance - Comments: NO LOB noted while sitting EOB   Standing balance support: Bilateral upper extremity supported Standing balance-Leahy Scale: Good Standing balance comment: SPO2 maintaining in 92-93% range in seated.                            Cognition Arousal/Alertness: Awake/alert Behavior During Therapy: WFL for tasks assessed/performed Overall Cognitive Status: Within Functional Limits for tasks assessed                                 General Comments: A&O      Exercises General Exercises - Lower Extremity Ankle Circles/Pumps: AROM;Seated;10 reps Quad Sets: AROM;Seated;10 reps Short Arc Quad: AROM;Seated;10 reps Hip ABduction/ADduction: AROM;Seated;10 reps Hip Flexion/Marching: AROM;Seated;10   reps Other Exercises Other Exercises: x5 incentive spirometer--Reminded to perform Flutter valve unit- as part of HEP and to continue to regain lung volume. Other Exercises: 80 ft walk Other Exercises: Has been instructed in quad sets, SLRs, ab-add, and ankle pumps- reminded to do 1-2 x daily for the LE ex and IS/Flutter valve hourly    General Comments        Pertinent Vitals/Pain Pain Assessment: No/denies pain    Home Living                      Prior Function            PT Goals  (current goals can now be found in the care plan section) Acute Rehab PT Goals Patient Stated Goal: to go home-anticipates today PT Goal Formulation: With patient Time For Goal Achievement: 11/20/19 Potential to Achieve Goals: Good Progress towards PT goals: Goals met/education completed, patient discharged from PT    Frequency    Min 3X/week(He is supposed to be being discharged today.)      PT Plan      Co-evaluation              AM-PAC PT "6 Clicks" Mobility   Outcome Measure  Help needed turning from your back to your side while in a flat bed without using bedrails?: None Help needed moving from lying on your back to sitting on the side of a flat bed without using bedrails?: None Help needed moving to and from a bed to a chair (including a wheelchair)?: None Help needed standing up from a chair using your arms (e.g., wheelchair or bedside chair)?: None Help needed to walk in hospital room?: A Little Help needed climbing 3-5 steps with a railing? : A Little 6 Click Score: 22    End of Session Equipment Utilized During Treatment: Oxygen Activity Tolerance: Patient limited by fatigue Patient left: in chair;with call bell/phone within reach;with chair alarm set Nurse Communication: Mobility status PT Visit Diagnosis: Difficulty in walking, not elsewhere classified (R26.2);Muscle weakness (generalized) (M62.81)     Time: 0125-0200 PT Time Calculation (min) (ACUTE ONLY): 35 min  Charges:  $Therapeutic Exercise: 23-37 mins                    Suzy P, PT # [phone] CGV cell   Suzy  Pennington 11/08/2019, 4:21 PM   

## 2019-11-08 NOTE — TOC Transition Note (Signed)
Transition of Care [facility]) - CM/SW Discharge Note   Patient Details  Name: Walter Baxter MRN: [id number] Date of Birth: [birth year]  Transition of Care [facility]) CM/SW Contact:  Baldemar Lenis, LCSW Phone Number: 11/08/2019, 12:41 PM   Clinical Narrative:   CSW attempted to call patient, unable to reach him at the room phone and his cell phone listed. CSW contacted patient's wife, Walter Baxter, to discuss patient going home. Walter Baxter happy that patient is coming home, she has missed him. CSW discussed home health and oxygen, and Walter Baxter indicated that she already receives oxygen through [facility] but she would be agreeable to whatever the hospital recommended, that she hasn't been super happy with them. CSW explained there is a Geneticist, molecular of oxygen supplies and Walter Baxter indicated understanding, said that whatever could be found would be good, she's just ready for the patient to come home.  Per chart review, DME orders were previously sent to [facility] and home health was previously set up with [facility]. CSW contacted [facility] for oxygen and walker, and ensured that [facility] was following for patient's discharge. Once oxygen is delivered to the house, patient can be picked up from the hospital. No further CSW needs at this time.    Final next level of care: Home w Home Health Services Barriers to Discharge: Barriers Resolved   Patient Goals and CMS Choice        Discharge Placement                Patient to be transferred to facility by: Family car Name of family member notified: Walter Baxter Patient and family notified of of transfer: 11/08/19  Discharge Plan and Services                DME Arranged: Oxygen, Walker rolling DME Agency: [facility] Date DME Agency Contacted: 11/08/19   Representative spoke with at DME Agency: Ian Malkin HH Arranged: RN, PT, OT, Nurse's Aide HH Agency: [facility] Date HH Agency Contacted: 11/08/19   Representative spoke with at [facility]  Agency: Clydie Braun  Social Determinants of Health (SDOH) Interventions     Readmission Risk Interventions No flowsheet data found.

## 2019-11-08 NOTE — Progress Notes (Signed)
Occupational Therapy Treatment Patient Details Name: Walter Baxter MRN: [id number] DOB: 1941-09-21 Today's Date: 11/08/2019    History of present illness 79 year old male with past medical history of diabetes, diet-controlled plus hypertension and obesity admitted on 1/28 at [facility] for shortness of breath and found to have Covid.  Patient was treated with Actemra, IV remdesivir and steroids.  Hospital course complicated by right lower extremity DVT.  Patient's oxygenation continued to decline and he currently is on 7 L nasal cannula.  He was transferred to [facility] today, 2/13.    OT comments  Patient up in chair on arrival and in pleasant mood.  Discussed his discharge plans for home and educated patient on fall prevention and energy conservation techniques.  Practiced walking distance in room that he will need to walk to get into his home (~76ft).  Completed with walker and supervision.  Patient remained on 3L O2 throughout session and SpO2 94 at rest and 91 during walk.  Patient had no shortness of breath or dizziness but was fatigued after walk.  Completed grooming while seated with set up.  Patient showing progress with therapy.  Will continue to follow with OT acutely to address the deficits listed below and ensure a safe discharge home.     Follow Up Recommendations  Home health OT    Equipment Recommendations  None recommended by OT    Recommendations for Other Services      Precautions / Restrictions Precautions Precautions: Fall Precaution Comments: Monitor SpO2 Restrictions Weight Bearing Restrictions: No       Mobility Bed Mobility               General bed mobility comments: Up in chair  Transfers Overall transfer level: Needs assistance Equipment used: Rolling walker (2 wheeled) Transfers: Sit to/from Stand Sit to Stand: Modified independent (Device/Increase time)              Balance Overall balance assessment: Mild deficits observed, not  formally tested                                         ADL either performed or assessed with clinical judgement   ADL Overall ADL's : Needs assistance/impaired     Grooming: Wash/dry hands;Wash/dry face;Oral care;Set up;Sitting                   Toilet Transfer: Supervision/safety;Ambulation;RW           Functional mobility during ADLs: Supervision/safety;Rolling walker       Vision       Perception     Praxis      Cognition Arousal/Alertness: Awake/alert Behavior During Therapy: WFL for tasks assessed/performed Overall Cognitive Status: Within Functional Limits for tasks assessed                                          Exercises Exercises: Other exercises Other Exercises Other Exercises: x5 incentive spirometer Other Exercises: 80 ft walk   Shoulder Instructions       General Comments      Pertinent Vitals/ Pain       Pain Assessment: No/denies pain  Home Living  Prior Functioning/Environment              Frequency  Min 3X/week        Progress Toward Goals  OT Goals(current goals can now be found in the care plan section)  Progress towards OT goals: Progressing toward goals  Acute Rehab OT Goals Patient Stated Goal: to go home OT Goal Formulation: With patient Time For Goal Achievement: 11/20/19 Potential to Achieve Goals: Good  Plan Discharge plan needs to be updated    Co-evaluation                 AM-PAC OT "6 Clicks" Daily Activity     Outcome Measure   Help from another person eating meals?: None Help from another person taking care of personal grooming?: A Little Help from another person toileting, which includes using toliet, bedpan, or urinal?: A Little Help from another person bathing (including washing, rinsing, drying)?: A Little Help from another person to put on and taking off regular upper body clothing?: A  Little Help from another person to put on and taking off regular lower body clothing?: A Little 6 Click Score: 19    End of Session Equipment Utilized During Treatment: Rolling walker;Oxygen  OT Visit Diagnosis: Unsteadiness on feet (R26.81);Muscle weakness (generalized) (M62.81)   Activity Tolerance Patient limited by fatigue   Patient Left in chair;with call bell/phone within reach;with chair alarm set   Nurse Communication Mobility status        Time: 7322-0254 OT Time Calculation (min): 30 min  Charges: OT General Charges $OT Visit: 1 Visit OT Treatments $Self Care/Home Management : 8-22 mins $Therapeutic Activity: 8-22 mins  Walter Baxter, OTR/L    Walter Baxter 11/08/2019, 3:38 PM

## 2019-11-08 NOTE — Progress Notes (Signed)
SATURATION QUALIFICATIONS: (This note is used to comply with regulatory documentation for home oxygen)    Patient Saturations on Room Air at Rest = 85%  Patient Saturations on Room Air while Ambulating = 83%  Patient Saturations on 4 Liters of oxygen while Ambulating = 85%  Please briefly explain why patient needs home oxygen:  Without supplemental oxygen patient's oxygen saturation levels drop below the required range for adequate oxygenation.

## 2019-11-08 NOTE — Discharge Instructions (Signed)
Information on my medicine - XARELTO (rivaroxaban)  This medication education was reviewed with me or my healthcare representative as part of my discharge preparation.   WHY WAS XARELTO PRESCRIBED FOR YOU? Xarelto was prescribed to treat blood clots that may have been found in the veins of your legs (deep vein thrombosis) or in your lungs (pulmonary embolism) and to reduce the risk of them occurring again.  What do you need to know about Xarelto? The starting dose is one 15 mg tablet taken TWICE daily with food for the FIRST 21 DAYS then on 11/17/2019  the dose is changed to one 20 mg tablet taken ONCE A DAY with your evening meal.  DO NOT stop taking Xarelto without talking to the health care provider who prescribed the medication.  Refill your prescription for 20 mg tablets before you run out.  After discharge, you should have regular check-up appointments with your healthcare provider that is prescribing your Xarelto.  In the future your dose may need to be changed if your kidney function changes by a significant amount.  What do you do if you miss a dose? If you are taking Xarelto TWICE DAILY and you miss a dose, take it as soon as you remember. You may take two 15 mg tablets (total 30 mg) at the same time then resume your regularly scheduled 15 mg twice daily the next day.  If you are taking Xarelto ONCE DAILY and you miss a dose, take it as soon as you remember on the same day then continue your regularly scheduled once daily regimen the next day. Do not take two doses of Xarelto at the same time.   Important Safety Information Xarelto is a blood thinner medicine that can cause bleeding. You should call your healthcare provider right away if you experience any of the following: ? Bleeding from an injury or your nose that does not stop. ? Unusual colored urine (red or dark brown) or unusual colored stools (red or black). ? Unusual bruising for unknown reasons. ? A serious fall  or if you hit your head (even if there is no bleeding).  Some medicines may interact with Xarelto and might increase your risk of bleeding while on Xarelto. To help avoid this, consult your healthcare provider or pharmacist prior to using any new prescription or non-prescription medications, including herbals, vitamins, non-steroidal anti-inflammatory drugs (NSAIDs) and supplements.  This website has more information on Xarelto: VisitDestination.com.br.   COVID-19: How to Protect Yourself and Others Know how it spreads  There is currently no vaccine to prevent coronavirus disease 2019 (COVID-19).  The best way to prevent illness is to avoid being exposed to this virus.  The virus is thought to spread mainly from person-to-person. ? Between people who are in close contact with one another (within about 6 feet). ? Through respiratory droplets produced when an infected person coughs, sneezes or talks. ? These droplets can land in the mouths or noses of people who are nearby or possibly be inhaled into the lungs. ? COVID-19 may be spread by people who are not showing symptoms. Everyone should Clean your hands often  Wash your hands often with soap and water for at least 20 seconds especially after you have been in a public place, or after blowing your nose, coughing, or sneezing.  If soap and water are not readily available, use a hand sanitizer that contains at least 60% alcohol. Cover all surfaces of your hands and rub them together until they feel  dry.  Avoid touching your eyes, nose, and mouth with unwashed hands. Avoid close contact  Limit contact with others as much as possible.  Avoid close contact with people who are sick.  Put distance between yourself and other people. ? Remember that some people without symptoms may be able to spread virus. ? This is especially important for people who are at higher risk of getting very  GainPain.com.cy Cover your mouth and nose with a mask when around others  You could spread COVID-19 to others even if you do not feel sick.  Everyone should wear a mask in public settings and when around people not living in their household, especially when social distancing is difficult to maintain. ? Masks should not be placed on young children under age 63, anyone who has trouble breathing, or is unconscious, incapacitated or otherwise unable to remove the mask without assistance.  The mask is meant to protect other people in case you are infected.  Do NOT use a facemask meant for a Dietitian.  Continue to keep about 6 feet between yourself and others. The mask is not a substitute for social distancing. Cover coughs and sneezes  Always cover your mouth and nose with a tissue when you cough or sneeze or use the inside of your elbow.  Throw used tissues in the trash.  Immediately wash your hands with soap and water for at least 20 seconds. If soap and water are not readily available, clean your hands with a hand sanitizer that contains at least 60% alcohol. Clean and disinfect  Clean AND disinfect frequently touched surfaces daily. This includes tables, doorknobs, light switches, countertops, handles, desks, phones, keyboards, toilets, faucets, and sinks. RackRewards.fr  If surfaces are dirty, clean them: Use detergent or soap and water prior to disinfection.  Then, use a household disinfectant. You can see a list of EPA-registered household disinfectants here. michellinders.com 05/25/2019 This information is not intended to replace advice given to you by your health care provider. Make sure you discuss any questions you have with your health care provider. Document Revised: 06/02/2019 Document Reviewed: 03/31/2019 Elsevier Patient  Education  [place].

## 2019-11-08 NOTE — Care Management Note (Signed)
Case Management Note  Patient Details  Name: Walter Baxter MRN: [id number] Date of Birth: 09/07/1941  Subjective/Objective:       CM verified with MD that [facility] and aide are not needed. Will update HH agency.             Action/Plan:   Expected Discharge Date:  11/08/19               Expected Discharge Plan:     In-House Referral:     Discharge planning Services     Post Acute Care Choice:    Choice offered to:     DME Arranged:  Oxygen, Walker rolling DME Agency:  [facility]  HH Arranged:  RN, PT, OT, Nurse's Aide HH Agency:  [facility]  Status of Service:     If discussed at Long Length of Stay Meetings, dates discussed:    Additional Comments:  Kermit Balo, RN 11/08/2019, 4:18 PM

## 2019-11-09 LAB — COMPREHENSIVE METABOLIC PANEL
ALT: 90 U/L — ABNORMAL HIGH (ref 0–44)
AST: 39 U/L (ref 15–41)
Albumin: 2.7 g/dL — ABNORMAL LOW (ref 3.5–5.0)
Alkaline Phosphatase: 66 U/L (ref 38–126)
Anion gap: 9 (ref 5–15)
BUN: 42 mg/dL — ABNORMAL HIGH (ref 8–23)
CO2: 27 mmol/L (ref 22–32)
Calcium: 8.1 mg/dL — ABNORMAL LOW (ref 8.9–10.3)
Chloride: 103 mmol/L (ref 98–111)
Creatinine, Ser: 0.89 mg/dL (ref 0.61–1.24)
GFR calc Af Amer: >60 mL/min (ref >60)
GFR calc non Af Amer: >60 mL/min (ref >60)
Glucose, Bld: 99 mg/dL (ref 70–99)
Potassium: 3.8 mmol/L (ref 3.5–5.1)
Sodium: 139 mmol/L (ref 135–145)
Total Bilirubin: 0.8 mg/dL (ref 0.3–1.2)
Total Protein: 4.8 g/dL — ABNORMAL LOW (ref 6.5–8.1)

## 2019-11-09 LAB — CBC WITH DIFFERENTIAL/PLATELET
Abs Immature Granulocytes: 0.22 10*3/uL — ABNORMAL HIGH (ref 0.00–0.07)
Basophils Absolute: 0 10*3/uL (ref 0.0–0.1)
Basophils Relative: 0 %
Eosinophils Absolute: 0.5 10*3/uL (ref 0.0–0.5)
Eosinophils Relative: 4 %
HCT: 42.7 % (ref 39.0–52.0)
Hemoglobin: 14 g/dL (ref 13.0–17.0)
Immature Granulocytes: 2 %
Lymphocytes Relative: 25 %
Lymphs Abs: 2.7 10*3/uL (ref 0.7–4.0)
MCH: 29.8 pg (ref 26.0–34.0)
MCHC: 32.8 g/dL (ref 30.0–36.0)
MCV: 90.9 fL (ref 80.0–100.0)
Monocytes Absolute: 0.8 10*3/uL (ref 0.1–1.0)
Monocytes Relative: 7 %
Neutro Abs: 6.4 10*3/uL (ref 1.7–7.7)
Neutrophils Relative %: 62 %
Platelets: 138 10*3/uL — ABNORMAL LOW (ref 150–400)
RBC: 4.7 MIL/uL (ref 4.22–5.81)
RDW: 14.9 % (ref 11.5–15.5)
WBC: 10.6 10*3/uL — ABNORMAL HIGH (ref 4.0–10.5)
nRBC: 0 % (ref 0.0–0.2)

## 2019-11-09 LAB — FERRITIN: Ferritin: 1137 ng/mL — ABNORMAL HIGH (ref 24–336)

## 2019-11-09 LAB — GLUCOSE, CAPILLARY
Glucose-Capillary: 69 mg/dL — ABNORMAL LOW (ref 70–99)
Glucose-Capillary: 166 mg/dL — ABNORMAL HIGH (ref 70–99)
Glucose-Capillary: 158 mg/dL — ABNORMAL HIGH (ref 70–99)

## 2019-11-09 LAB — D-DIMER, QUANTITATIVE: D-Dimer, Quant: 1.12 ug/mL-FEU — ABNORMAL HIGH (ref 0.00–0.50)

## 2019-11-09 LAB — C-REACTIVE PROTEIN: CRP: <0.5 mg/dL

## 2019-11-09 MED ORDER — ENSURE ENLIVE PO LIQD
237.0000 mL | Freq: Three times a day (TID) | ORAL | Status: DC
  Administered 2019-11-09: 237 mL via ORAL

## 2019-11-09 NOTE — Progress Notes (Addendum)
Initial Nutrition Assessment  DOCUMENTATION CODES:   Not applicable  INTERVENTION:   Ensure Enlive po TID, each supplement provides 350 kcal and 20 grams of protein  Recommend liberalizing diet to Regular to improve intake.  Hormel Shake daily with Breakfast which provides 520 kcals and 22 g of protein and Magic cup BID with lunch and dinner, each supplement provides 290 kcal and 9 grams of protein, automatically on meal trays to optimize nutritional intake.    NUTRITION DIAGNOSIS:   Increased nutrient needs related to acute illness, catabolic illness(COVID 19) as evidenced by estimated needs.  GOAL:   Patient will meet greater than or equal to 90% of their needs  MONITOR:   PO intake, Supplement acceptance, Labs  REASON FOR ASSESSMENT:   Consult Assessment of nutrition requirement/status  ASSESSMENT:   79 yo male admitted with acute respiratory failure r/t COVID 19 PNA. PMH includes DM-2, HTN, HLD.   Currently on a low sodium heart healthy diet. Percent meal completion not recorded since 2/13, when intake was 10-40% of meals.  Requiring 3 L oxygen via nasal cannula.    Labs reviewed.  CBG's: [phone]  Medications reviewed and include vitamin C, novolog, levemir, tradjenta, flomax, zinc.  Patient weighed 98.7 kg on 10/22/19, down to 82.3 kg today. 17% weight loss within a month is significant for the time frame. Suspect malnutrition, but unable to obtain enough information at this time for identification of malnutrition.    NUTRITION - FOCUSED PHYSICAL EXAM:  unable to complete  Diet Order:   Diet Order            Diet - low sodium heart healthy        Diet Heart Room service appropriate? Yes; Fluid consistency: Thin  Diet effective now              EDUCATION NEEDS:   Not appropriate for education at this time  Skin:  Skin Assessment: Reviewed RN Assessment  Last BM:  2/16  Height:   Ht Readings from Last 1 Encounters:  11/05/19 5\' 8"   (1.727 m)    Weight:   Wt Readings from Last 1 Encounters:  11/09/19 82.3 kg    Ideal Body Weight:  70 kg  BMI:  Body mass index is 27.59 kg/m.  Estimated Nutritional Needs:   Kcal:  2300-2600  Protein:  115-130 gm  Fluid:  > 2 L    11/11/19, RD, LDN, CNSC Please refer to Amion for contact information.

## 2019-11-09 NOTE — TOC Transition Note (Signed)
Transition of Care [facility]) - CM/SW Discharge Note   Patient Details  Name: Walter Baxter MRN: [id number] Date of Birth: [birth year]  Transition of Care [facility]) CM/SW Contact:  Baldemar Lenis, LCSW Phone Number: 11/09/2019, 11:07 AM   Clinical Narrative:   CSW noting per chart review that patient did not go home yesterday. CSW contacted [employer] about oxygen delivery, and with delays with RN documentation of oxygen saturation note to complete the order and then being unable to reach the patient's wife, the oxygen was not delivered to the home last night. Oxygen will be delivered today and the patient can discharge home afterwards. Family will provide transportation.     Final next level of care: Home w Home Health Services Barriers to Discharge: Barriers Resolved   Patient Goals and CMS Choice        Discharge Placement                Patient to be transferred to facility by: Family car Name of family member notified: [name] Patient and family notified of of transfer: 11/08/19  Discharge Plan and Services                DME Arranged: Oxygen, Walker rolling DME Agency: [facility] Date DME Agency Contacted: 11/08/19   Representative spoke with at DME Agency: Ian Malkin HH Arranged: RN, PT, OT, Nurse's Aide HH Agency: [facility] Date HH Agency Contacted: 11/08/19   Representative spoke with at [facility] Agency: Clydie Braun  Social Determinants of Health (SDOH) Interventions     Readmission Risk Interventions No flowsheet data found.

## 2019-12-13 ENCOUNTER — Other Ambulatory Visit: Payer: Self-pay | Admitting: Advanced Practice Midwife

## 2019-12-13 ENCOUNTER — Other Ambulatory Visit: Payer: Self-pay | Admitting: *Deleted

## 2019-12-13 NOTE — Patient Outreach (Signed)
[facility]) Care Management  12/13/2019  MACIAH SCHWEIGERT [birth year] [id number]   Referral Date: 3/19 Referral Source: MD office Referral Reason: Assessment of needs Insurance: [facility]   Outreach attempt #1, unsuccessful.  HIPAA compliant voice message left.  Plan: RN CM will send unsuccessful outreach and follow up within the next 3-4 business days.  [name], [place], MSN [facility] Care Management  [facility] Manager [phone]

## 2019-12-15 ENCOUNTER — Other Ambulatory Visit: Payer: Self-pay

## 2019-12-16 ENCOUNTER — Other Ambulatory Visit: Payer: Self-pay | Admitting: *Deleted

## 2019-12-16 NOTE — Patient Outreach (Signed)
[facility]) Care Management  12/16/2019  Walter Baxter [birth year] [id number]   Referral Date: 3/19 Referral Source: MD office Referral Reason: Assessment of needs Insurance: [facility]   Outreach attempt #2, unsuccessful.  HIPAA compliant voice message left.  Plan: RN CM will follow up within the next 3-4 business days.  [name], [place], MSN [facility] Care Management  [facility] Manager [phone]

## 2019-12-22 ENCOUNTER — Encounter: Payer: Self-pay | Admitting: *Deleted

## 2019-12-22 ENCOUNTER — Other Ambulatory Visit: Payer: Self-pay | Admitting: *Deleted

## 2019-12-22 NOTE — Patient Outreach (Addendum)
[facility]) Care Management  12/22/2019  EVO ADERMAN July 01, 1941 [id number]   Referral Date:3/19 Referral Source:MD office Referral Reason:Assessment of needs Insurance:[employer]   Outreach attempt #3, initially unsuccessful. HIPAA compliant voice message left.  Call then placed to listed cell number, identity verified.  This care manager introduced self and stated purpose of call.  [facility] care management services explained.    Social: Lives with wife, is the primary caregiver for her.  State he does not have any immediate family in the area that is available to help with the management of their care.  State he has a friend that will help if needed.  State he has been ordering groceries online and doing the cooking for he and his wife.  Although he has been getting this done, state he has had to take his time not to overdo it.  He is still active with home health, recovering from Covid 19 infection.   Conditions: Per chart, has history of CHF, HTN, DM, and HLD.  State he does not check his weights daily but denies any swelling in extremities.  Sporadically will check his glucose, state it ranges between 100-110's, A1C is 6.1.  Report home health nurse check his BP and oxygen levels when they visit, state it is always "normal."  Medications: Reviewed with member, denies the need for financial assistance.  Report he has been compliant with administration.  Appointments: State he has follow up visit with PCP in July.  He is still able to drive but has not driven anywhere since being out of the hospital.  Advance Directives: Dose not have legal POA but state he would like his niece to be appointed.   Plan: RN CM will place referral to social worker for advanced life planning. RNCM will follow up with member within the next 2 weeks.  Fall Risk  12/22/2019  Falls in the past year? 0  Number falls in past yr: 0  Injury with Fall? 0   Depression screen PHQ 2/9 12/22/2019   Decreased Interest 0  Down, Depressed, Hopeless 0  PHQ - 2 Score 0   THN CM Care Plan Problem One     Most Recent Value  Care Plan Problem One  Risk for complications of Covid as evidenced by 2 hospital admissions in the last 2 months  Role Documenting the Problem One  Care Management Coordinator  Care Plan for Problem One  Active  [facility] Long Term Goal   Member will not be admitted to hospital within the next 45 days  THN Long Term Goal Start Date  12/22/19  Interventions for Problem One Long Term Goal  Reviewed plan of care with member.  Educated on Covid precautions and recovery period.  THN CM Short Term Goal #1   Member will report increase in strength over the next 4 weeks  THN CM Short Term Goal #1 Start Date  12/22/19  Interventions for Short Term Goal #1  Confirmed member remains active with home health.  educated on importance of increasing exercise to improve strength  THN CM Short Term Goal #2   Member will have legal POA and plan for long term management within the next 4 weeks  THN CM Short Term Goal #2 Start Date  12/22/19  Interventions for Short Term Goal #2  Referral placed to CSW for community resources and advanced life planning      [name], [place], MSN [facility] Care Management  [facility] Care Manager [phone]

## 2020-01-03 ENCOUNTER — Other Ambulatory Visit: Payer: Self-pay | Admitting: *Deleted

## 2020-01-03 NOTE — Patient Outreach (Signed)
[facility]) Care Management  01/03/2020  Walter Baxter June 09, 1941 [id number]   Call placed to member to follow up on continued recovery from Covid infection as well as complete initial assessment.  He report he is feeling stronger, was able to take his wife out for a drive a few days ago.  This was the first time he has been able to do this since being in the hospital.  Feel he will also be able to take her to her scheduled test within the next 2 weeks.  Had follow up phone visit with PCP last week, state he will continue to be on Xarelto until the end of May at least for blood clot, complication of Covid infection.  State if he will have to continue taking, he may need assistance with paying for it.  Report Aimee from [facility] is still coming weekly, will be visiting later today.  Denies any urgent concerns, will follow up within the next month.  THN CM Care Plan Problem One     Most Recent Value  Care Plan Problem One  Risk for complications of Covid as evidenced by 2 hospital admissions in the last 2 months  Role Documenting the Problem One  Care Management [place] for Problem One  Active  [facility] Long Term Goal   Member will not be admitted to hospital within the next 45 days  THN Long Term Goal Start Date  12/22/19  Interventions for Problem One Long Term Goal  Confired member had follow up call with PCP, educated on importance of close follow up in effort to decrease risk for readmission  THN CM Short Term Goal #1   Member will report increase in strength over the next 4 weeks  THN CM Short Term Goal #1 Start Date  12/22/19  [facility] CM Short Term Goal #1 Met Date  01/03/20  THN CM Short Term Goal #2   Member will have legal POA and plan for long term management within the next 4 weeks  THN CM Short Term Goal #2 Start Date  12/22/19  Interventions for Short Term Goal #2  Provided member with contact information for assigned CSW.  Advised to contact to discuss  concerns     Valente David, Therapist, sports, MSN [place] Manager [phone]

## 2020-01-06 ENCOUNTER — Telehealth: Payer: Self-pay | Admitting: *Deleted

## 2020-01-06 NOTE — Patient Outreach (Signed)
[facility]) Care Management  01/06/2020  Walter Baxter Apr 03, 1941 [id number]   CSW made an initial attempt to try and contact patient today to perform phone assessment, as well as assess and assist with social needs and services, without success. A HIPPA compliant message was left for patient on voicemail (home#: [phone] & cell#: [phone]). CSW is currently awaiting a return call. CSW will have CMA mail unsuccessful outreach letter along with Advance Directive Packets for him & his wife & make a second outreach attempt within the next 3-4 days, if CSW does not receive a return call from patient in the meantime.    Lincoln Maxin, LCSW [facility]  Clinical Social Worker cell #: [phone]

## 2020-01-11 ENCOUNTER — Other Ambulatory Visit: Payer: Self-pay | Admitting: *Deleted

## 2020-01-12 NOTE — Patient Outreach (Signed)
[facility]) Care Management  01/12/2020  Walter Baxter 1941/01/05 [id number]   CSW made a second attempt reach patient to follow-up on referral made by [facility] RNCM for [facility] re: resources but patient did not answer. CSW left HIPPA compliant voicemail (ph#: [phone]) and previously had CMA mail out unsuccessful outreach letter. CSW will make 3rd attempt to reach patient in 4 days, if CSW does not receive a return call from patient in the meantime.      Lincoln Maxin, LCSW [facility]  Clinical Social Worker cell #: [phone]

## 2020-01-19 ENCOUNTER — Other Ambulatory Visit: Payer: Self-pay | Admitting: *Deleted

## 2020-01-19 NOTE — Patient Outreach (Signed)
[facility]) Care Management  01/19/2020  Walter Baxter 1941/03/17 [id number]   CSW made a third & final attempt reach patient to follow-up on referral made by [facility] RNCM, [facility] for advance directive resources but patient did not answer. CSW left HIPPA compliant voicemail (ph#: [phone]) and previously had CMA mail out unsuccessful outreach letter along with advance directive packets for patient & his wife and a Production designer, theatre/television/film. CSW will close case if no response in 10 days.    Lincoln Maxin, LCSW [facility]  Clinical Social Worker cell #: [phone]

## 2020-01-30 ENCOUNTER — Ambulatory Visit: Payer: Self-pay | Admitting: *Deleted

## 2020-02-01 ENCOUNTER — Other Ambulatory Visit: Payer: Self-pay | Admitting: *Deleted

## 2020-02-01 NOTE — Patient Outreach (Signed)
[facility]) Care Management  02/01/2020  ALICK LECOMTE May 30, 1941 [id number]   Call placed to member to follow up on management of medical conditions, no answer.  HIPAA compliant voice message left, will follow up within the next 3-4 business days.  [name], [place], MSN [facility] Care Management  [facility] Manager [phone]

## 2020-02-07 ENCOUNTER — Other Ambulatory Visit: Payer: Self-pay | Admitting: *Deleted

## 2020-02-07 NOTE — Patient Outreach (Signed)
[facility] Care Management  02/07/2020  Walter Baxter September 03, 1941 [id number]   Outreach attempt #2, unsuccessful, HIPAA compliant voice message left.  Unsuccessful outreach letter sent, will follow up within the next 3-4 business days.  [name], [place], MSN [facility] Care Management  [facility] Manager [phone]

## 2020-02-13 ENCOUNTER — Other Ambulatory Visit: Payer: Self-pay | Admitting: *Deleted

## 2020-02-13 NOTE — Patient Outreach (Signed)
[facility]) Care Management  02/13/2020  Walter Baxter 1941/01/29 [id number]   Outreach attempt #3, successful.  Member report he has been doing well, energy level is improving slowly.  State his last visit from home health nurse was last week.  Remains able to care for self independently, denies any shortness of breath.  Denies any urgent concerns, encouraged to contact this care manager with questions.  Will follow up within the next month, if remain stable will consider transition to health coach.    THN CM Care Plan Problem One     Most Recent Value  Care Plan Problem One  Risk for complications of Covid as evidenced by 2 hospital admissions in the last 2 months  Role Documenting the Problem One  Care Management [place] for Problem One  Active  [facility] Long Term Goal   Member will not be admitted to hospital within the next 45 days  THN Long Term Goal Start Date  12/22/19  [facility] Long Term Goal Met Date  02/13/20  [facility] CM Short Term Goal #2   Member will have legal POA and plan for long term management within the next 4 weeks  THN CM Short Term Goal #2 Start Date  12/22/19  [facility] CM Short Term Goal #2 Met Date  02/13/20     Valente David, RN, MSN [place] [phone]

## 2020-03-13 ENCOUNTER — Other Ambulatory Visit: Payer: Self-pay | Admitting: *Deleted

## 2020-03-13 NOTE — Patient Outreach (Signed)
[facility]) Care Management  03/13/2020  Walter Baxter 1941/02/13 [id number]   Monthly call placed to member to follow up on management of chronic medical conditions, no answer.  HIPAA compliant voice message left. Will follow up within the next 3-4 business days.  [name], [place], MSN [facility] Care Management  [facility] Manager [phone]

## 2020-03-19 ENCOUNTER — Other Ambulatory Visit: Payer: Self-pay | Admitting: *Deleted

## 2020-03-19 NOTE — Patient Outreach (Signed)
[facility]) Care Management  03/19/2020  Walter Baxter 21-Dec-1940 [id number]   Outreach attempt #2, unsuccessful, HIPAA compliant voice message left.  Will send outreach letter and follow up within the next 3-4 business days.  [name], [place], MSN [facility] Care Management  [facility] Manager [phone]

## 2020-03-23 ENCOUNTER — Other Ambulatory Visit: Payer: Self-pay | Admitting: *Deleted

## 2020-03-23 NOTE — Patient Outreach (Signed)
[facility]) Care Management  03/23/2020  Walter Baxter [birth year] [id number]   Outreach attempt #3, unsuccessful, HIPAA compliant voice message left.  Will follow up within the next 3 weeks, if no answer will close case due to inability to maintain contact.  [name], [place], MSN [facility] Care Management  [facility] Manager [phone]

## 2020-04-13 ENCOUNTER — Other Ambulatory Visit: Payer: Self-pay | Admitting: *Deleted

## 2020-04-13 NOTE — Patient Outreach (Signed)
[facility]) Care Management  04/13/2020  Walter Baxter 02-Nov-1940 [id number]   Outreach attempt #4, successful.  Call placed to follow up on management of chronic medical conditions.  State he has been doing well, still working on improving his energy levels.  Does admit that it is much better then it was when he was initially discharged from hospital.  Was seen by PCP on 7/1, report no major concerns, will follow up in the next few months.  State his wife is also doing well, they continue to have [facility] visit monthly.  He denies any urgent concerns at this time, will transition to health coach as no further complex needs identified.  Will notify PCP of transition.  [name], [place], MSN [facility] Care Management  [facility] Manager [phone]

## 2020-04-17 ENCOUNTER — Other Ambulatory Visit: Payer: Self-pay | Admitting: *Deleted

## 2020-05-03 ENCOUNTER — Other Ambulatory Visit: Payer: Self-pay | Admitting: Family Medicine

## 2020-05-03 DIAGNOSIS — N5089 Other specified disorders of the male genital organs: Secondary | ICD-10-CM

## 2020-05-08 ENCOUNTER — Ambulatory Visit
Admission: RE | Admit: 2020-05-08 | Discharge: 2020-05-08 | Disposition: A | Discharge status: Home / Self Care | Payer: Medicare Other | Source: Ambulatory Visit | Attending: Family Medicine | Admitting: Family Medicine

## 2020-05-08 ENCOUNTER — Other Ambulatory Visit: Payer: Self-pay

## 2020-05-08 DIAGNOSIS — N5089 Other specified disorders of the male genital organs: Secondary | ICD-10-CM

## 2020-05-09 ENCOUNTER — Other Ambulatory Visit: Payer: Medicare Other

## 2020-05-10 ENCOUNTER — Other Ambulatory Visit: Payer: Self-pay | Admitting: *Deleted

## 2020-05-10 NOTE — Patient Outreach (Signed)
[facility]) Care Management  05/10/2020  ELIJIO STAPLES 03-17-1941 [id number]  Unsuccessful outreach attempt made to patient. Patient picked up the phone and stated he could not speak because he was waiting on a doctor's callback. He asked if nurse would call him back at in several weeks.   Plan: RN Health Coach will call patient within the month of  September.   [name] RN, BSN [facility] Care Management  RN Health Coach [phone] [email or web address]

## 2020-05-24 ENCOUNTER — Other Ambulatory Visit: Payer: Self-pay | Admitting: *Deleted

## 2020-05-24 NOTE — Patient Outreach (Signed)
[facility]) Care Management  05/24/2020  Walter Baxter 10-11-1940 [id number]  Unsuccessful outreach attempt made to patient. RN Health Coach left HIPAA compliant voicemail message along with her contact information.  Plan: RN Health Coach will call patient within the month of October.  [name] RN, BSN [facility] Care Management  RN Health Coach [phone] [email or web address]

## 2020-05-31 ENCOUNTER — Other Ambulatory Visit: Payer: Self-pay | Admitting: *Deleted

## 2020-05-31 NOTE — Patient Outreach (Signed)
[facility]) Care Management  05/31/2020  Walter Baxter 26-Oct-1940 [id number]  Successful telephone outreach call to patient in response to a VM the patient left this nurse. HIPAA identifiers obtained. Patient and his wife are being seen by [facility]. Patient requested nurse to do a follow-up call to [facility] regarding  patient's concerns. Patient states he is unavailable to talk any further today because he needs to take his wife to the doctor.   Plan: RN Health Coach will [facility], will call patient within the month of September and patient agrees to future outreach calls.   [name] RN, BSN [facility] Care Management  RN Health Coach [phone] [email or web address]

## 2020-06-06 ENCOUNTER — Other Ambulatory Visit: Payer: Self-pay | Admitting: *Deleted

## 2020-06-06 NOTE — Patient Outreach (Signed)
[facility]) Care Management  06/06/2020  Walter Baxter 1941-09-22 [id number]  Unsuccessful outreach attempt made to patient. RN Health Coach left HIPAA compliant voicemail message along with her contact information.  Plan: RN Health Coach will call patient within the month of October.  [name] RN, BSN [facility] Care Management  RN Health Coach [phone] [email or web address]

## 2020-07-04 ENCOUNTER — Ambulatory Visit: Payer: Self-pay | Admitting: *Deleted

## 2020-07-19 ENCOUNTER — Other Ambulatory Visit: Payer: Self-pay | Admitting: *Deleted

## 2020-07-19 NOTE — Patient Outreach (Signed)
[facility]) Care Management  07/19/2020  Walter Baxter 10-08-1940 [id number]  Called patient in response to a staff message that patient left with [facility] office. Patient reports that he and his wife [name] will need to cancel their phone visits with this nurse today due to [name] needing to take [name] to the doctor.  [name] did reschedule their appointments for 07/24/20.  Plan: RN Health Coach will call patient on 07/24/20 and patient agrees to future outreach calls.   [name] RN, BSN [facility] Care Management  RN Health Coach [phone] [email or web address]

## 2020-07-24 ENCOUNTER — Other Ambulatory Visit: Payer: Self-pay | Admitting: *Deleted

## 2020-07-24 ENCOUNTER — Encounter: Payer: Self-pay | Admitting: *Deleted

## 2020-07-24 NOTE — Patient Outreach (Signed)
[facility]) Care Management  [facility] Care Manager  07/24/2020   Walter Baxter 12-15-1940 [id number]  Subjective: Successful telephone outreach call to patient. HIPAA identifiers obtained. Patient reports he is doing well. He is the primary caregiver for his wife and also reports that he is slowly regaining his strength from having covid. He denies having any recent falls and states that he has a steady gait, he uses a straight cane occasionally for stability. Emotionally he states he is doing well and his friend Rocky Link is available to assist him if needed. He denies any needs for DME.   Patient reports that his diabetes is currently under control. He takes his FBS 2-3 times weekly and his values range from 110-132. Patient's A1c was 6.0 on 03/22/20. He reports that his appetite has improved and that he supplements with Ensure. Nurse will send him some Ensure coupons.He is working towards increasing his daily activity as he gains strength back from having covid. Explaining he has good days and bad days. He tries not to be sedentary reporting that he does walk to his mailbox and back daily which is a good distance. Patient denies any other needs at this time and confirms that he does have this nurses contact number to call her if needed.   Encounter Medications:  Outpatient Encounter Medications as of 07/24/2020  Medication Sig Note  . acetaminophen (TYLENOL) 500 MG tablet Take 1,000 mg by mouth every 6 (six) hours as needed for mild pain or headache.   Marland Kitchen aspirin EC 81 MG tablet Take 81 mg by mouth daily.   . famotidine (PEPCID) 20 MG tablet Take 20 mg by mouth 2 (two) times daily.   . metoprolol tartrate (LOPRESSOR) 50 MG tablet Take 50 mg by mouth 2 (two) times daily.   . tamsulosin (FLOMAX) 0.4 MG CAPS capsule Take 0.4 mg by mouth daily.   Marland Kitchen guaiFENesin (MUCINEX) 600 MG 12 hr tablet Take 600 mg by mouth 2 (two) times daily as needed for cough or to loosen phlegm. (Patient not taking:  Reported on 07/24/2020) 07/24/2020: Completed  . montelukast (SINGULAIR) 10 MG tablet Take 10 mg by mouth at bedtime. (Patient not taking: Reported on 07/24/2020) 07/24/2020: No longer takes  . promethazine-codeine (PHENERGAN WITH CODEINE) 6.25-10 MG/5ML syrup Take 5 mLs by mouth every 6 (six) hours as needed for cough. (Patient not taking: Reported on 07/24/2020) 07/24/2020: completed  . Rivaroxaban 15 & 20 MG TBPK Follow package directions: Take one 15mg  tablet by mouth twice a day. On day 22, switch to one 20mg  tablet once a day. Take with food. (Patient not taking: Reported on 07/24/2020) 07/24/2020: Stopped after 90 days after having covid as precaution against blood clots   No facility-administered encounter medications on file as of 07/24/2020.    Functional Status:  In your present state of health, do you have any difficulty performing the following activities: 12/22/2019 11/05/2019  Hearing? N Y  Vision? N N  Difficulty concentrating or making decisions? N N  Walking or climbing stairs? N Y  Dressing or bathing? N N  Doing errands, shopping? N N  Preparing Food and eating ? N -  Using the Toilet? N -  In the past six months, have you accidently leaked urine? N -  Do you have problems with loss of bowel control? N -  Managing your Medications? N -  Managing your Finances? N -  Housekeeping or managing your Housekeeping? N -  Some recent data might be  hidden    Fall/Depression Screening: Fall Risk  07/24/2020 12/22/2019  Falls in the past year? 0 0  Number falls in past yr: 0 0  Injury with Fall? 0 0  Risk for fall due to : Other (Comment);Impaired balance/gait -  Risk for fall due to: Comment still feel weak from having covid -  Follow up Falls prevention discussed;Education provided;Falls evaluation completed -   PHQ 2/9 Scores 07/24/2020 12/22/2019  PHQ - 2 Score 0 0    Assessment:  Goals Addressed            This Visit's Progress   . Eat Healthy       Follow Up Date 09/20/20     - change to whole grain breads, cereal, pasta - set goal weight - drink 6 to 8 glasses of water each day - limit fast food meals to no more than 1 per week - manage portion size - prepare main meal at home 3 to 5 days each week - read food labels for fat, fiber, carbohydrates and portion size - reduce red meat to 2 to 3 times a week - set a realistic goal    Why is this important?   When you are ready to manage your nutrition or weight, having a plan and setting goals will help.  Taking small steps to change how you eat and exercise is a good place to start.    Notes:     Marland Kitchen Monitor and Manage My Blood Sugar       Follow Up Date 09/20/20    - check blood sugar at prescribed times - check blood sugar if I feel it is too high or too low - enter blood sugar readings and medication or insulin into daily log    Why is this important?   Checking your blood sugar at home helps to keep it from getting very high or very low.  Writing the results in a diary or log helps the doctor know how to care for you.  Your blood sugar log should have the time, date and the results.  Also, write down the amount of insulin or other medicine that you take.  Other information, like what you ate, exercise done and how you were feeling, will also be helpful.     Notes: Patient states he takes his blood sugar 2-3 times weekly. He has maintained his A1c below 6.5 since 2018    . Obtain Eye Exam       Follow Up Date 09/20/20    - schedule appointment with eye doctor    Why is this important?   Eye check-ups are important when you have diabetes.  Vision loss can be prevented.    Notes: Patient states he last had an eye exam on 12/20. He was supposed to have cataract surgery and then became ill with covid. Patient has since felt weak and has not scheduled his cataract surgery for fear of being put to sleep. Nurse encouraged patient to make an eye exam appointment due to keeping his annual diabetic eye  exam and following up on cataracts.     . Perform Foot Care       Follow Up Date 09/20/20    - check feet daily for cuts, sores or redness - trim toenails straight across - wash and dry feet carefully every day - wear comfortable, cotton socks - wear comfortable, well-fitting shoes    Why is this important?   Good foot care  is very important when you have diabetes.  There are many things you can do to keep your feet healthy and catch a problem early.    Notes: Patient states that he monitors his feet daily      Plan: RN Health Coach will send PCP a barrier letter and today's assessment note, will send patient Ensure coupons, will call patient within the month of December, and patient agrees to future outreach calls.   [name] RN, BSN [facility] Care Management  RN Health Coach [phone] [email or web address]

## 2020-08-31 ENCOUNTER — Other Ambulatory Visit: Payer: Self-pay | Admitting: *Deleted

## 2020-08-31 NOTE — Patient Outreach (Signed)
[facility]) Care Management  08/31/2020  Walter Baxter [birth year] [id number]  Unsuccessful outreach attempt made to patient. Patient answered the phone and stated he was not able to speak today. He requested a call back at a later date.  Plan: RN Health Coach will call patient within the month of January.  [name] RN, BSN [facility] Care Management  RN Health Coach [phone] [email or web address]

## 2020-09-08 IMAGING — DX DG CHEST 1V PORT
1 series · 1 of 1 positions shown · non-contrast
Comparison: Chest x-rays dated 11/03/2019, 10/30/2019 and
10/20/2019.

CLINICAL DATA: Pulmonary disease. Shortness of breath, generalized
weakness.

EXAM:
PORTABLE CHEST 1 VIEW

[chest ap]
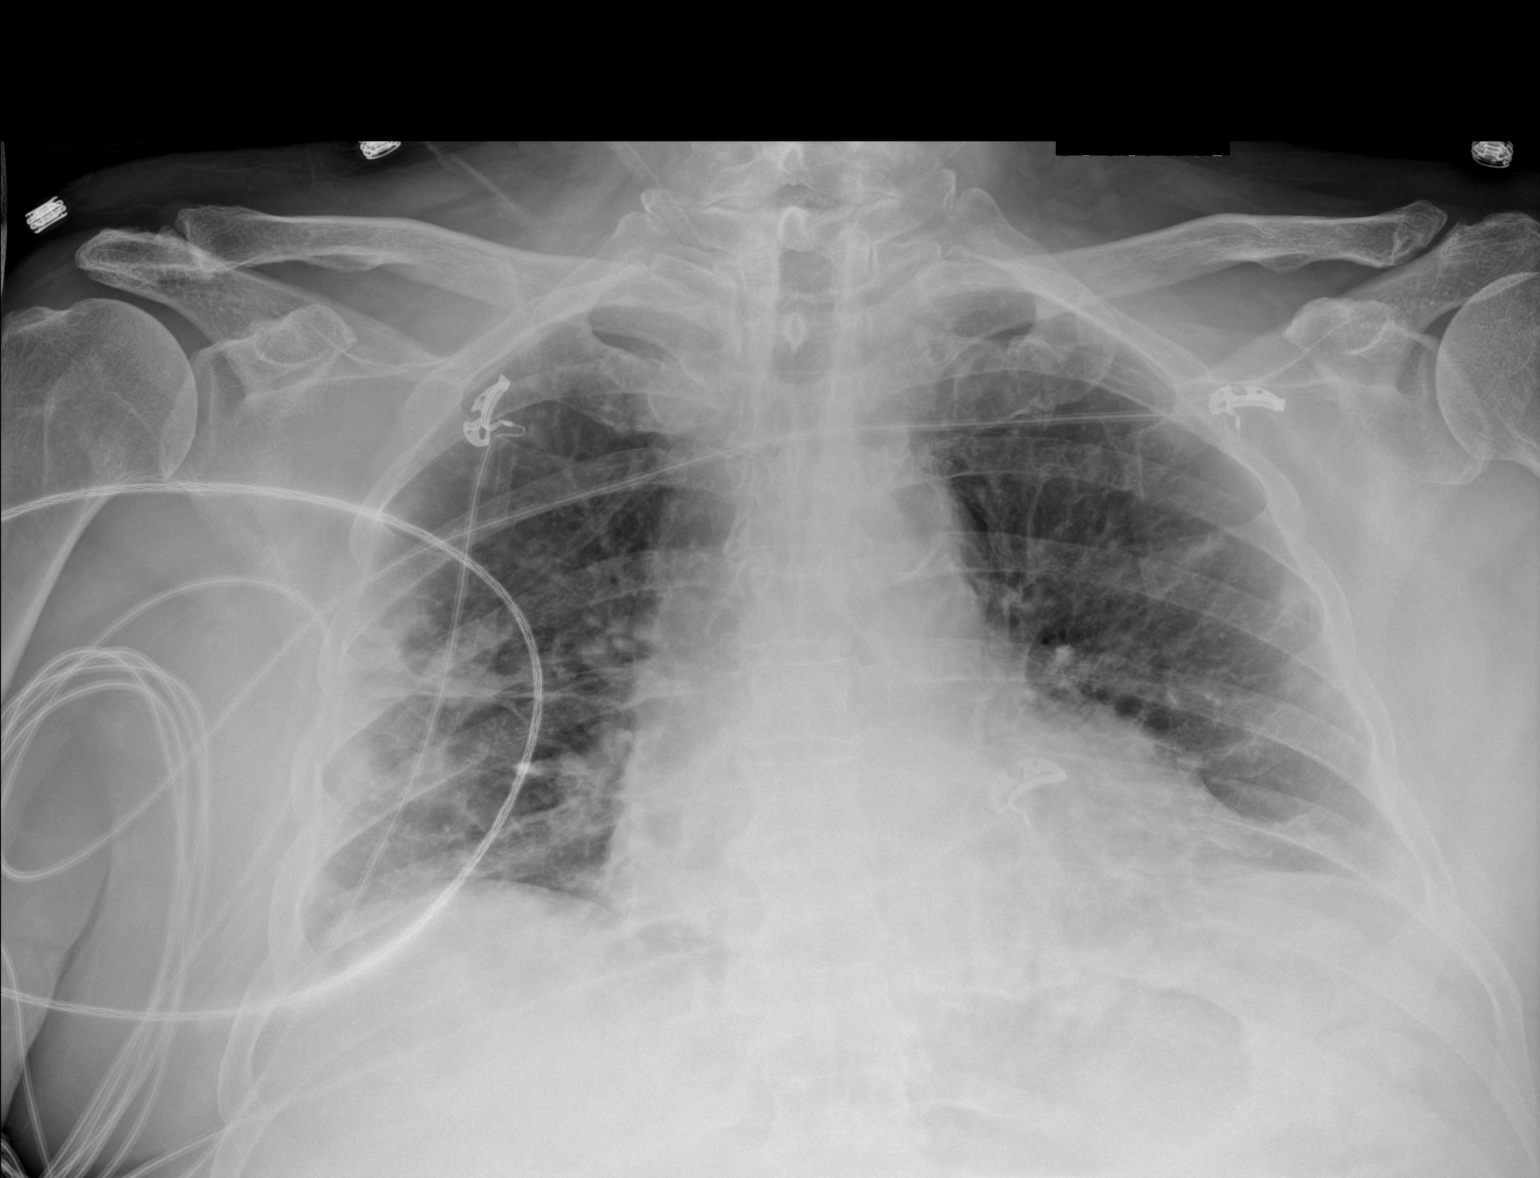

[1 of 1 positions shown; findings below may reference images not displayed]

FINDINGS: No significant change of the opacities along the periphery of the
RIGHT mid and lower lung zones. Similar milder changes along the
periphery of the LEFT mid and lower lung zones. These opacities were
previously described as bilateral multifocal pneumonia. No new lung
findings. No pleural effusion or pneumothorax is seen. Stable
borderline cardiomegaly.
IMPRESSION: Stable appearance of the bilateral multifocal pneumonia.

## 2020-09-24 DIAGNOSIS — E039 Hypothyroidism, unspecified: Secondary | ICD-10-CM | POA: Diagnosis not present

## 2020-09-24 DIAGNOSIS — E1169 Type 2 diabetes mellitus with other specified complication: Secondary | ICD-10-CM | POA: Diagnosis not present

## 2020-09-24 DIAGNOSIS — Z8616 Personal history of COVID-19: Secondary | ICD-10-CM | POA: Diagnosis not present

## 2020-09-24 DIAGNOSIS — I10 Essential (primary) hypertension: Secondary | ICD-10-CM | POA: Diagnosis not present

## 2020-09-24 DIAGNOSIS — E782 Mixed hyperlipidemia: Secondary | ICD-10-CM | POA: Diagnosis not present

## 2020-10-15 ENCOUNTER — Other Ambulatory Visit: Payer: Self-pay | Admitting: *Deleted

## 2020-10-15 NOTE — Patient Instructions (Signed)
Goals Addressed            This Visit's Progress   . COMPLETED: Eat Healthy       Timeframe:  Long-Range Goal Priority:  Medium Start Date: 07/24/20                            Expected End Date: 10/15/20                      Follow Up Date 10/15/20    - change to whole grain breads, cereal, pasta - set goal weight - drink 6 to 8 glasses of water each day - limit fast food meals to no more than 1 per week - manage portion size - prepare main meal at home 3 to 5 days each week - read food labels for fat, fiber, carbohydrates and portion size - reduce red meat to 2 to 3 times a week - set a realistic goal    Why is this important?   When you are ready to manage your nutrition or weight, having a plan and setting goals will help.  Taking small steps to change how you eat and exercise is a good place to start.    Notes: Updated 10/15/20: Patient states that he does limit the amount of sodium, sugar, and carbohydrates in his diet. Nurse previously sent patient a planning healthy meals booklet.     . Monitor and Manage My Blood Sugar       Timeframe:  Long-Range Goal Priority:  High Start Date:  07/24/20                           Expected End Date:  03/21/21                     Follow Up Date 01/19/21    - check blood sugar at prescribed times - check blood sugar if I feel it is too high or too low - enter blood sugar readings and medication or insulin into daily log    Why is this important?   Checking your blood sugar at home helps to keep it from getting very high or very low.  Writing the results in a diary or log helps the doctor know how to care for you.  Your blood sugar log should have the time, date and the results.  Also, write down the amount of insulin or other medicine that you take.  Other information, like what you ate, exercise done and how you were feeling, will also be helpful.     Notes: Patient states he takes his blood sugar 2-3 times weekly. He has maintained  his A1c below 6.5 since 2018 Updated 10/15/20: Patient reports taking his blood sugar every other day. His last A1c was 6.2.    . Obtain Eye Exam       Timeframe:  Long-Range Goal Priority:  High Start Date:  07/24/20                           Expected End Date: 03/21/21                      Follow Up Date 01/19/21    - schedule appointment with eye doctor    Why is this important?   Eye check-ups are important when  you have diabetes.  Vision loss can be prevented.    Notes: Patient states he last had an eye exam on 12/20. He was supposed to have cataract surgery and then became ill with covid. Patient has since felt weak and has not scheduled his cataract surgery for fear of being put to sleep. Nurse encouraged patient to make an eye exam appointment due to keeping his annual diabetic eye exam and following up on cataracts.  Update: 10/15/20 Patient states he is feeling stronger and that he will make an eye appointment with Dr. Elmer Picker.    . COMPLETED: Perform Foot Care       Timeframe:  Long-Range Goal Priority:  Low Start Date:  07/24/20                           Expected End Date:  10/15/20                     Follow Up Date 10/15/20    - check feet daily for cuts, sores or redness - trim toenails straight across - wash and dry feet carefully every day - wear comfortable, cotton socks - wear comfortable, well-fitting shoes    Why is this important?   Good foot care is very important when you have diabetes.  There are many things you can do to keep your feet healthy and catch a problem early.    Notes: Patient states that he monitors his feet daily and he does not have any breakdown or issues.

## 2020-10-15 NOTE — Patient Outreach (Signed)
[facility]) Care Management  [facility] Care Manager  10/15/2020   Walter Baxter 1941/09/11 [id number]  Subjective: Successful telephone outreach call to patient. HIPAA identifiers obtained.  Patient states that he is doing well. He is feeling more energetic and attributes this due to recovering from covid and he started to take a multi-vitamin daily. Patient states he is taking his FBS every other day with ranges of 108-125. His last A1c was 6.2 on 09/24/20. Patient denies having any falls or sick days. Patient did not have any further questions or concerns today and did confirm that the patient has this nurse's contact number to call her if needed.   Encounter Medications:  Outpatient Encounter Medications as of 10/15/2020  Medication Sig Note  . acetaminophen (TYLENOL) 500 MG tablet Take 1,000 mg by mouth every 6 (six) hours as needed for mild pain or headache.   Marland Kitchen aspirin EC 81 MG tablet Take 81 mg by mouth daily.   . famotidine (PEPCID) 20 MG tablet Take 20 mg by mouth 2 (two) times daily.   . metoprolol tartrate (LOPRESSOR) 50 MG tablet Take 50 mg by mouth 2 (two) times daily.   . Multiple Vitamins-Minerals (MULTIVITAMIN WITH MINERALS) tablet Take 1 tablet by mouth daily.   . tamsulosin (FLOMAX) 0.4 MG CAPS capsule Take 0.4 mg by mouth daily.   Marland Kitchen guaiFENesin (MUCINEX) 600 MG 12 hr tablet Take 600 mg by mouth 2 (two) times daily as needed for cough or to loosen phlegm. (Patient not taking: No sig reported) 10/15/2020: completed  . montelukast (SINGULAIR) 10 MG tablet Take 10 mg by mouth at bedtime. (Patient not taking: No sig reported) 10/15/2020: completed  . promethazine-codeine (PHENERGAN WITH CODEINE) 6.25-10 MG/5ML syrup Take 5 mLs by mouth every 6 (six) hours as needed for cough. (Patient not taking: No sig reported) 10/15/2020: completed  . Rivaroxaban 15 & 20 MG TBPK Follow package directions: Take one 15mg  tablet by mouth twice a day. On day 22, switch to one 20mg  tablet  once a day. Take with food. (Patient not taking: No sig reported) 10/15/2020: completed   No facility-administered encounter medications on file as of 10/15/2020.    Functional Status:  In your present state of health, do you have any difficulty performing the following activities: 12/22/2019 11/05/2019  Hearing? N Y  Vision? N N  Difficulty concentrating or making decisions? N N  Walking or climbing stairs? N Y  Dressing or bathing? N N  Doing errands, shopping? N N  Preparing Food and eating ? N -  Using the Toilet? N -  In the past six months, have you accidently leaked urine? N -  Do you have problems with loss of bowel control? N -  Managing your Medications? N -  Managing your Finances? N -  Housekeeping or managing your Housekeeping? N -  Some recent data might be hidden    Fall/Depression Screening: Fall Risk  10/15/2020 07/24/2020 12/22/2019  Falls in the past year? 0 0 0  Number falls in past yr: 0 0 0  Injury with Fall? 0 0 0  Risk for fall due to : Impaired balance/gait;Impaired mobility Other (Comment);Impaired balance/gait -  Risk for fall due to: Comment - still feel weak from having covid -  Follow up Falls prevention discussed;Education provided;Falls evaluation completed Falls prevention discussed;Education provided;Falls evaluation completed -   PHQ 2/9 Scores 07/24/2020 12/22/2019  PHQ - 2 Score 0 0    Assessment:  Goals Addressed  This Visit's Progress   . COMPLETED: Eat Healthy       Timeframe:  Long-Range Goal Priority:  Medium Start Date: 07/24/20                            Expected End Date: 10/15/20                      Follow Up Date 10/15/20    - change to whole grain breads, cereal, pasta - set goal weight - drink 6 to 8 glasses of water each day - limit fast food meals to no more than 1 per week - manage portion size - prepare main meal at home 3 to 5 days each week - read food labels for fat, fiber, carbohydrates and portion size -  reduce red meat to 2 to 3 times a week - set a realistic goal    Why is this important?   When you are ready to manage your nutrition or weight, having a plan and setting goals will help.  Taking small steps to change how you eat and exercise is a good place to start.    Notes: Updated 10/15/20: Patient states that he does limit the amount of sodium, sugar, and carbohydrates in his diet. Nurse previously sent patient a planning healthy meals booklet.     . Monitor and Manage My Blood Sugar       Timeframe:  Long-Range Goal Priority:  High Start Date:  07/24/20                           Expected End Date:  03/21/21                     Follow Up Date 01/19/21    - check blood sugar at prescribed times - check blood sugar if I feel it is too high or too low - enter blood sugar readings and medication or insulin into daily log    Why is this important?   Checking your blood sugar at home helps to keep it from getting very high or very low.  Writing the results in a diary or log helps the doctor know how to care for you.  Your blood sugar log should have the time, date and the results.  Also, write down the amount of insulin or other medicine that you take.  Other information, like what you ate, exercise done and how you were feeling, will also be helpful.     Notes: Patient states he takes his blood sugar 2-3 times weekly. He has maintained his A1c below 6.5 since 2018 Updated 10/15/20: Patient reports taking his blood sugar every other day. His last A1c was 6.2.    . Obtain Eye Exam       Timeframe:  Long-Range Goal Priority:  High Start Date:  07/24/20                           Expected End Date: 03/21/21                      Follow Up Date 01/19/21    - schedule appointment with eye doctor    Why is this important?   Eye check-ups are important when you have diabetes.  Vision loss can be prevented.    Notes:  Patient states he last had an eye exam on 12/20. He was supposed to have  cataract surgery and then became ill with covid. Patient has since felt weak and has not scheduled his cataract surgery for fear of being put to sleep. Nurse encouraged patient to make an eye exam appointment due to keeping his annual diabetic eye exam and following up on cataracts.  Update: 10/15/20 Patient states he is feeling stronger and that he will make an eye appointment with Dr. Elmer Picker.    . COMPLETED: Perform Foot Care       Timeframe:  Long-Range Goal Priority:  Low Start Date:  07/24/20                           Expected End Date:  10/15/20                     Follow Up Date 10/15/20    - check feet daily for cuts, sores or redness - trim toenails straight across - wash and dry feet carefully every day - wear comfortable, cotton socks - wear comfortable, well-fitting shoes    Why is this important?   Good foot care is very important when you have diabetes.  There are many things you can do to keep your feet healthy and catch a problem early.    Notes: Patient states that he monitors his feet daily and he does not have any breakdown or issues.       Plan: RN Health Coach will send PCP a quarterly update and will call patient within the month of April. Follow-up:  Patient agrees to Care Plan and Follow-up.   [name] RN, BSN [facility] Care Management  RN Health Coach [phone] [email or web address]

## 2021-01-17 ENCOUNTER — Other Ambulatory Visit: Payer: Self-pay | Admitting: *Deleted

## 2021-01-17 NOTE — Patient Outreach (Signed)
[facility]) Care Management  01/17/2021  Walter Baxter 03-Jun-1941 [id number]  Unsuccessful outreach attempt made to patient. RN Health Coach left HIPAA compliant voicemail message along with her contact information.  Plan: RN Health Coach will call patient within the month of May.  [name] RN, BSN [facility] Care Management  RN Health Coach [phone] [email or web address]

## 2021-01-21 ENCOUNTER — Other Ambulatory Visit: Payer: Self-pay | Admitting: *Deleted

## 2021-01-21 NOTE — Patient Outreach (Signed)
[facility]) Care Management  [facility] Care Manager  01/21/2021   Walter Baxter 1941-05-03 [id number]  Subjective: Successful telephone outreach call to patient. HIPAA identifiers obtained. Patient shared that his wife recently passed away. Nurse offered sincere condolences. Patient states that his niece, nephew, and church members have been checking in on him and that currently he does not feel that he needs any grief counseling. He reports taking his blood sugar every other day and his FBS ranges are 1100-125. Patient's last A1c was 6.2 on 09/24/20. Patient denies having any falls or sick days. Patient did not have any further questions or concerns today and did confirm that the patient has this nurse's contact number to call her if needed.   Encounter Medications:  Outpatient Encounter Medications as of 01/21/2021  Medication Sig Note  . acetaminophen (TYLENOL) 500 MG tablet Take 1,000 mg by mouth every 6 (six) hours as needed for mild pain or headache.   Marland Kitchen aspirin EC 81 MG tablet Take 81 mg by mouth daily.   . clonazePAM (KLONOPIN) 0.5 MG tablet Take 0.5 mg by mouth daily as needed.   . famotidine (PEPCID) 20 MG tablet Take 20 mg by mouth 2 (two) times daily.   Marland Kitchen levothyroxine (SYNTHROID) 25 MCG tablet Take 25 mcg by mouth every morning.   . metoprolol tartrate (LOPRESSOR) 50 MG tablet Take 50 mg by mouth 2 (two) times daily.   . Multiple Vitamins-Minerals (MULTIVITAMIN WITH MINERALS) tablet Take 1 tablet by mouth daily.   . pravastatin (PRAVACHOL) 20 MG tablet Take 20 mg by mouth daily.   . tamsulosin (FLOMAX) 0.4 MG CAPS capsule Take 0.4 mg by mouth daily.   Marland Kitchen guaiFENesin (MUCINEX) 600 MG 12 hr tablet Take 600 mg by mouth 2 (two) times daily as needed for cough or to loosen phlegm. (Patient not taking: No sig reported) 01/21/2021: completed  . montelukast (SINGULAIR) 10 MG tablet Take 10 mg by mouth at bedtime. (Patient not taking: No sig reported) 01/21/2021: completed  .  promethazine-codeine (PHENERGAN WITH CODEINE) 6.25-10 MG/5ML syrup Take 5 mLs by mouth every 6 (six) hours as needed for cough. (Patient not taking: No sig reported) 01/21/2021: completed  . Rivaroxaban 15 & 20 MG TBPK Follow package directions: Take one 15mg  tablet by mouth twice a day. On day 22, switch to one 20mg  tablet once a day. Take with food. (Patient not taking: No sig reported) 01/21/2021: completed   No facility-administered encounter medications on file as of 01/21/2021.    Functional Status:  No flowsheet data found.  Fall/Depression Screening: Fall Risk  01/21/2021 10/15/2020 07/24/2020  Falls in the past year? 0 0 0  Number falls in past yr: 0 0 0  Injury with Fall? 0 0 0  Risk for fall due to : Impaired mobility;Impaired balance/gait Impaired balance/gait;Impaired mobility Other (Comment);Impaired balance/gait  Risk for fall due to: Comment - - still feel weak from having covid  Follow up Falls prevention discussed;Education provided;Falls evaluation completed Falls prevention discussed;Education provided;Falls evaluation completed Falls prevention discussed;Education provided;Falls evaluation completed   PHQ 2/9 Scores 07/24/2020 12/22/2019  PHQ - 2 Score 0 0    Assessment:  Goals Addressed            This Visit's Progress   . ([facility]N) Monitor and Manage My Blood Sugar       Timeframe:  Long-Range Goal Priority:  High Start Date:  07/24/20  Expected End Date:  08/21/21                     Follow Up Date 04/20/21    - check blood sugar at prescribed times - check blood sugar if I feel it is too high or too low - enter blood sugar readings and medication or insulin into daily log    Why is this important?   Checking your blood sugar at home helps to keep it from getting very high or very low.  Writing the results in a diary or log helps the doctor know how to care for you.  Your blood sugar log should have the time, date and the results.  Also,  write down the amount of insulin or other medicine that you take.  Other information, like what you ate, exercise done and how you were feeling, will also be helpful.     Notes: Patient states he takes his blood sugar 2-3 times weekly. He has maintained his A1c below 6.5 since 2018 Updated 10/15/20: Patient reports taking his blood sugar every other day. His last A1c was 6.2.  Updated 01/21/21: Patient reports his blood sugar ranged are 100-125. His last A1c was 6.2 on 09/24/20.    [name] Eye Exam       Timeframe:  Long-Range Goal Priority:  High Start Date:  07/24/20                           Expected End Date: 08/21/21                     Follow Up Date 04/20/21    - schedule appointment with eye doctor    Why is this important?   Eye check-ups are important when you have diabetes.  Vision loss can be prevented.    Notes: Patient states he last had an eye exam on 12/20. He was supposed to have cataract surgery and then became ill with covid. Patient has since felt weak and has not scheduled his cataract surgery for fear of being put to sleep. Nurse encouraged patient to make an eye exam appointment due to keeping his annual diabetic eye exam and following up on cataracts.  Update: 10/15/20 Patient states he is feeling stronger and that he will make an eye appointment with Dr. Elmer Picker.      Plan: RN Health Coach will send PCP a quarterly update and will call patient within the month of June. Follow-up:  Patient agrees to Care Plan and Follow-up.  [name] RN, BSN [facility] Care Management  RN Health Coach [phone] [email or web address]

## 2021-01-21 NOTE — Patient Instructions (Signed)
Goals Addressed            This Visit's Progress   . (THN) Monitor and Manage My Blood Sugar       Timeframe:  Long-Range Goal Priority:  High Start Date:  07/24/20                           Expected End Date:  08/21/21                     Follow Up Date 04/20/21    - check blood sugar at prescribed times - check blood sugar if I feel it is too high or too low - enter blood sugar readings and medication or insulin into daily log    Why is this important?   Checking your blood sugar at home helps to keep it from getting very high or very low.  Writing the results in a diary or log helps the doctor know how to care for you.  Your blood sugar log should have the time, date and the results.  Also, write down the amount of insulin or other medicine that you take.  Other information, like what you ate, exercise done and how you were feeling, will also be helpful.     Notes: Patient states he takes his blood sugar 2-3 times weekly. He has maintained his A1c below 6.5 since 2018 Updated 10/15/20: Patient reports taking his blood sugar every other day. His last A1c was 6.2.  Updated 01/21/21: Patient reports his blood sugar ranged are 100-125. His last A1c was 6.2 on 09/24/20.    Walter Baxter Eye Exam       Timeframe:  Long-Range Goal Priority:  High Start Date:  07/24/20                           Expected End Date: 08/21/21                     Follow Up Date 04/20/21    - schedule appointment with eye doctor    Why is this important?   Eye check-ups are important when you have diabetes.  Vision loss can be prevented.    Notes: Patient states he last had an eye exam on 12/20. He was supposed to have cataract surgery and then became ill with covid. Patient has since felt weak and has not scheduled his cataract surgery for fear of being put to sleep. Nurse encouraged patient to make an eye exam appointment due to keeping his annual diabetic eye exam and following up on cataracts.  Update:  10/15/20 Patient states he is feeling stronger and that he will make an eye appointment with Dr. Elmer Picker.

## 2021-01-31 ENCOUNTER — Ambulatory Visit: Payer: Self-pay | Admitting: *Deleted

## 2021-02-08 DIAGNOSIS — I10 Essential (primary) hypertension: Secondary | ICD-10-CM | POA: Diagnosis not present

## 2021-02-08 DIAGNOSIS — E039 Hypothyroidism, unspecified: Secondary | ICD-10-CM | POA: Diagnosis not present

## 2021-02-08 DIAGNOSIS — R051 Acute cough: Secondary | ICD-10-CM | POA: Diagnosis not present

## 2021-02-08 DIAGNOSIS — E1169 Type 2 diabetes mellitus with other specified complication: Secondary | ICD-10-CM | POA: Diagnosis not present

## 2021-02-08 DIAGNOSIS — Z03818 Encounter for observation for suspected exposure to other biological agents ruled out: Secondary | ICD-10-CM | POA: Diagnosis not present

## 2021-02-08 DIAGNOSIS — R519 Headache, unspecified: Secondary | ICD-10-CM | POA: Diagnosis not present

## 2021-02-08 DIAGNOSIS — U071 COVID-19: Secondary | ICD-10-CM | POA: Diagnosis not present

## 2021-02-08 DIAGNOSIS — B349 Viral infection, unspecified: Secondary | ICD-10-CM | POA: Diagnosis not present

## 2021-02-08 DIAGNOSIS — Z8616 Personal history of COVID-19: Secondary | ICD-10-CM | POA: Diagnosis not present

## 2021-03-12 IMAGING — US US SCROTUM
1 series · 13 of 25 positions shown · non-contrast
Comparison: None

CLINICAL DATA: RIGHT testicular pain and swelling, currently on
ciprofloxacin

EXAM:
ULTRASOUND OF SCROTUM
TECHNIQUE: Complete ultrasound examination of the testicles, epididymis, and
other scrotal structures was performed.

[Series 1: us scrotum · 0.08mm/px · 13 of 138 slices shown]
[im 1/138]
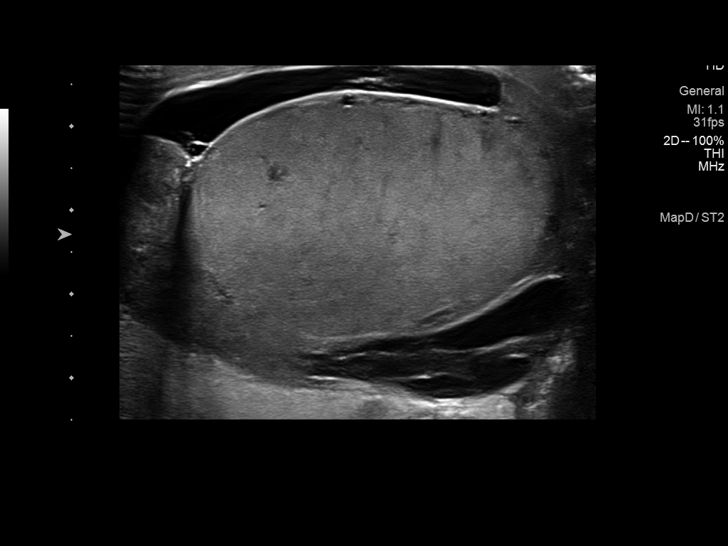
[im 12/138]
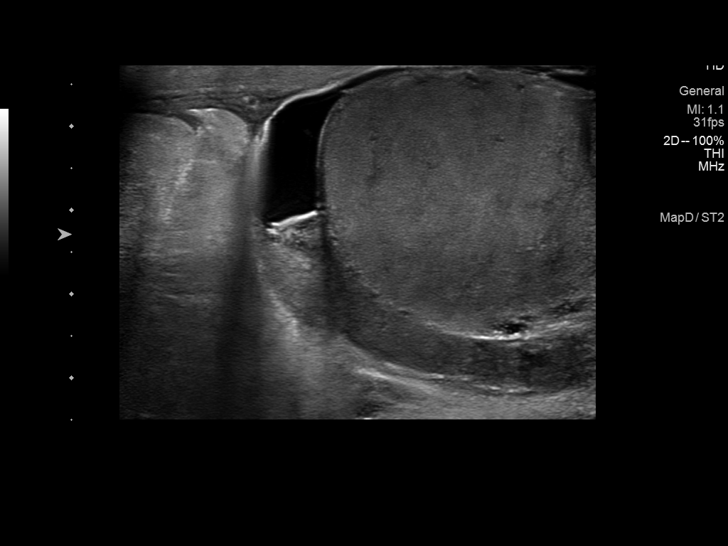
[im 23/138]
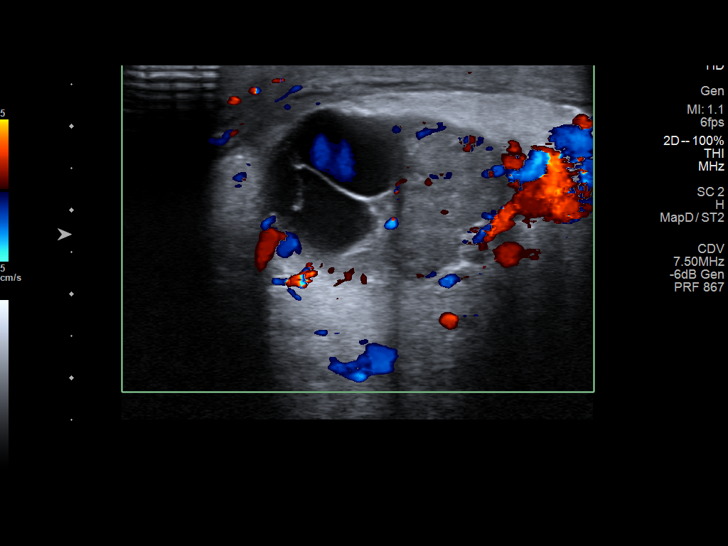
[im 35/138]
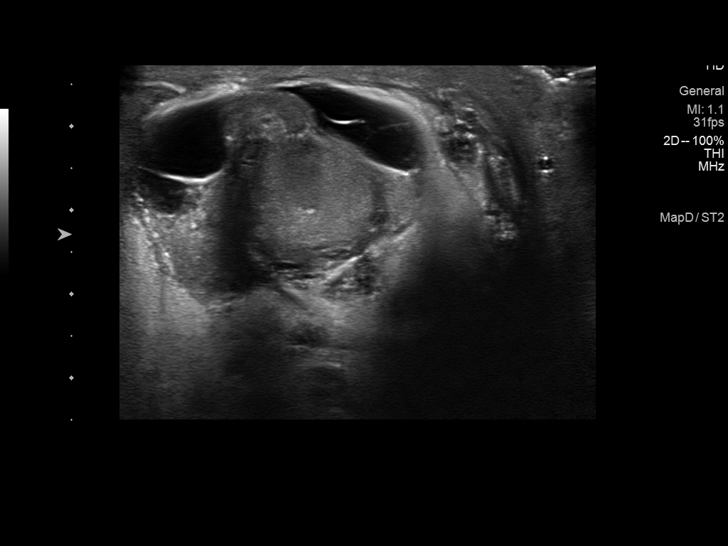
[im 46/138]
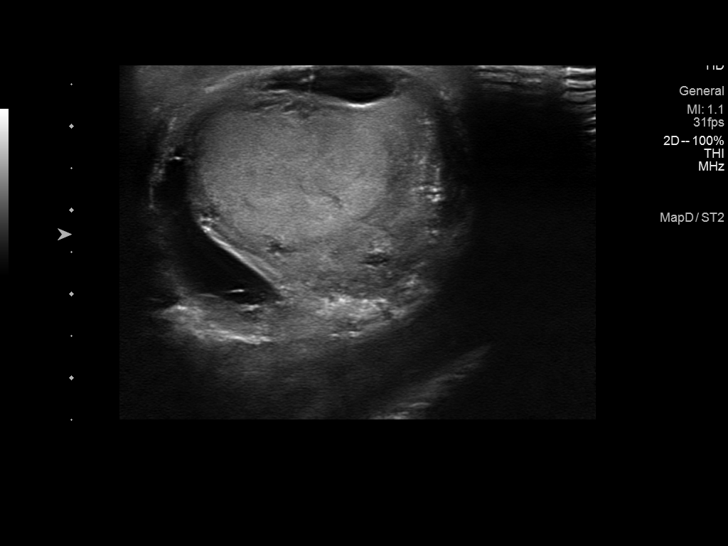
[im 58/138]
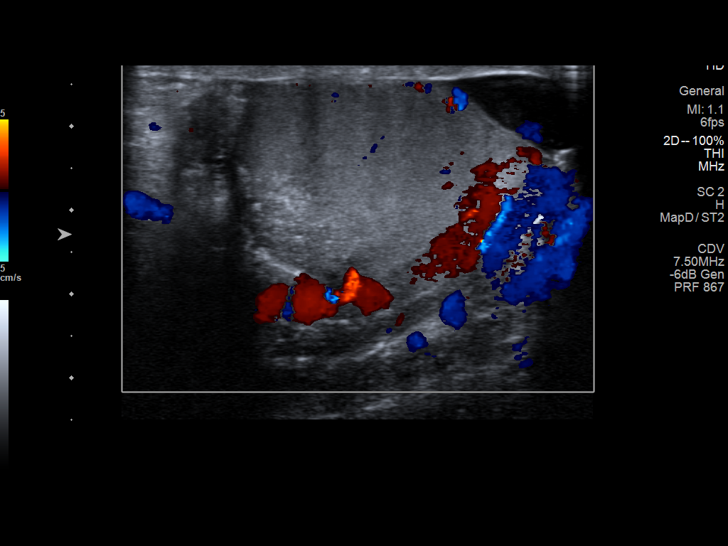
[im 69/138]
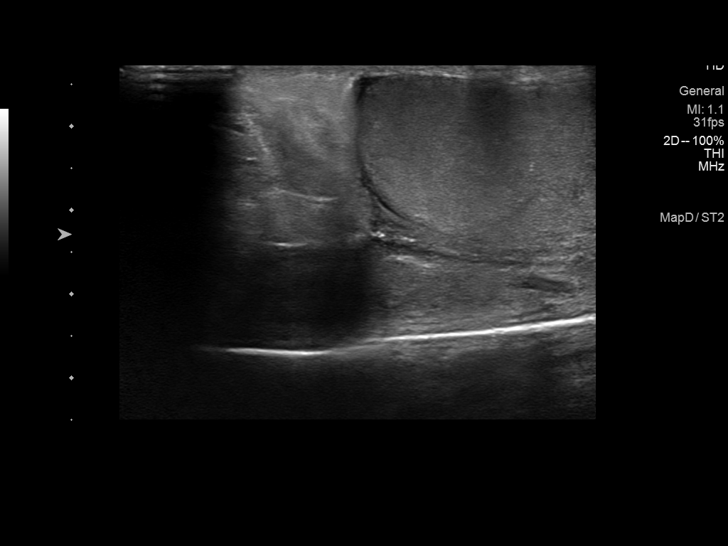
[im 80/138]
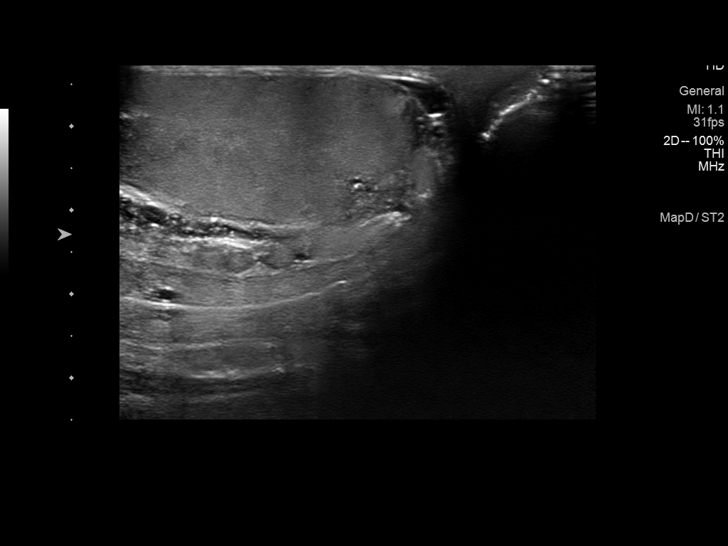
[im 92/138]
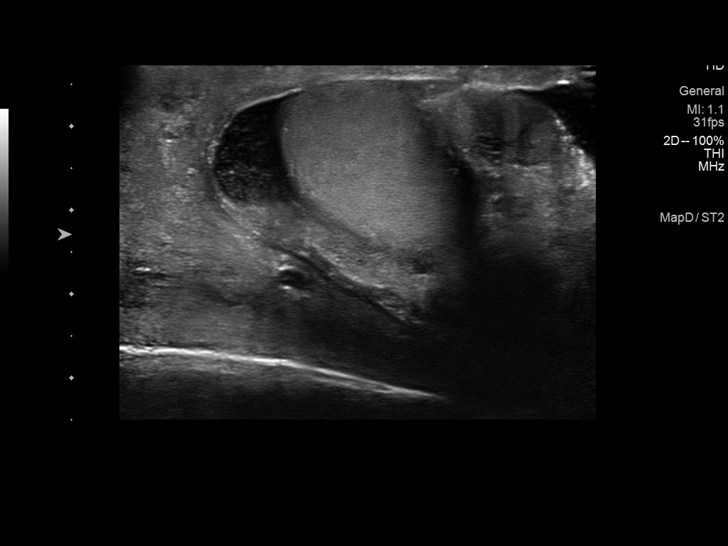
[im 103/138]
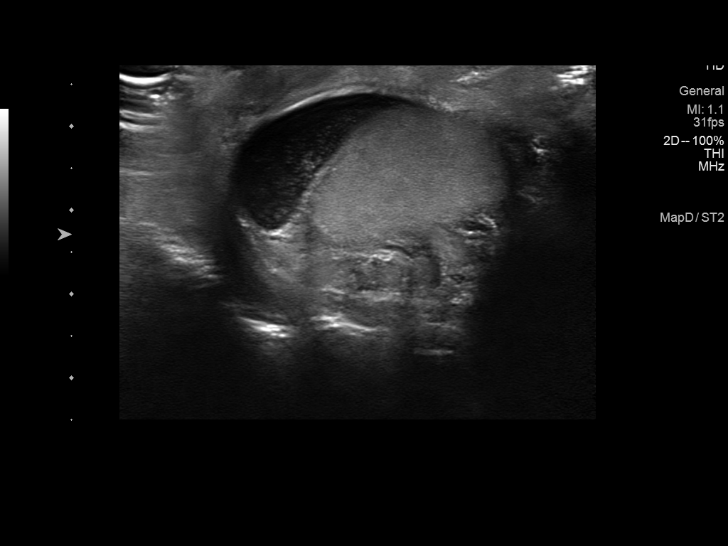
[im 115/138]
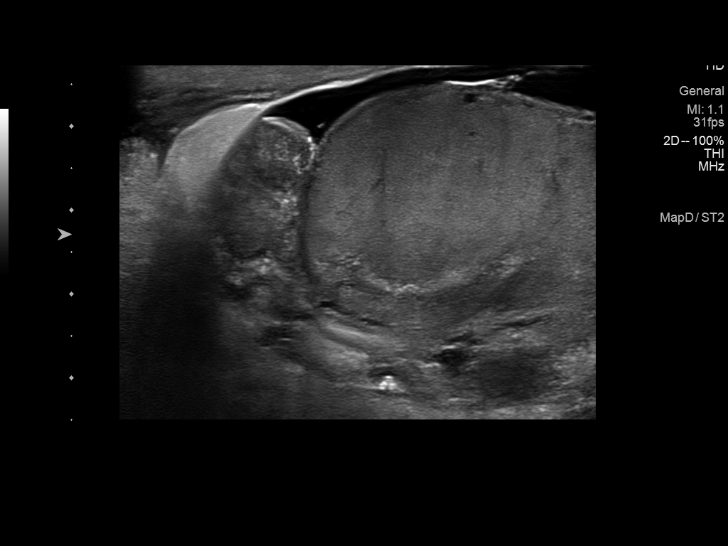
[im 126/138]
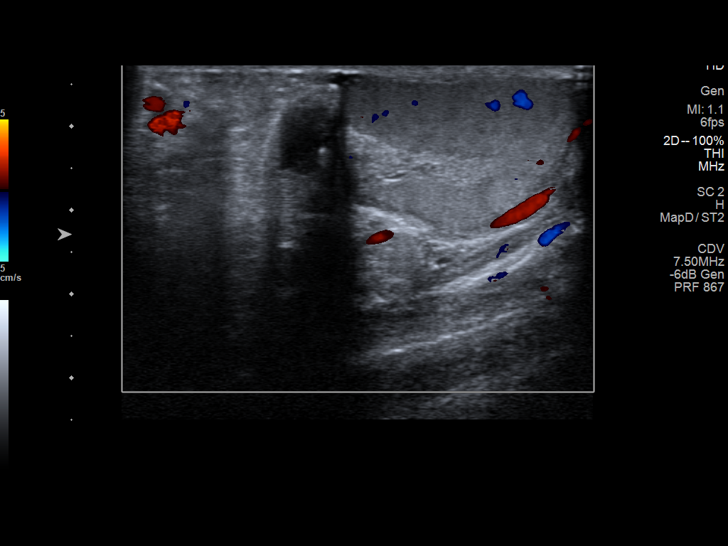
[im 138/138]
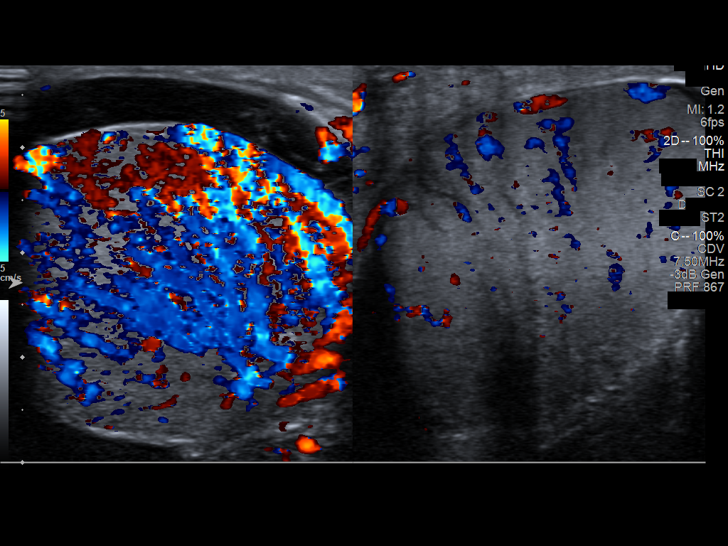

[13 of 25 positions shown; findings below may reference images not displayed]

FINDINGS: Right testicle

Measurements: 4.4 x 3.1 x 4.1 cm. Heterogeneous echogenicity. Slight
thickening of the tunica. Small focus of hypoechogenicity at the
mediastinum, question developing tubular ectasia of the rete testis.
No definite calcifications or additional mass. Markedly
hypervascular on color Doppler imaging.

Left testicle

Measurements: 3.7 x 3.0 x 3.0 cm. Normal parenchymal echogenicity.
Small focus of tubular ectasia of the rete testis. No focal mass. No
microcalcifications. Internal blood flow present on color Doppler
imaging.

Right epididymis:  Enlarged, heterogeneous and hypervascular

Left epididymis:  Enlarged and heterogeneous without focal mass.

Hydrocele: Complicated moderate-sized RIGHT collection with multiple
septations and scattered debris, question complicated hydrocele
versus pyocele.

Varicocele: BILATERAL varicoceles, demonstrating slow flow
IMPRESSION: BILATERAL epididymitis RIGHT greater than LEFT with moderately
complicated RIGHT hydrocele versus pyocele.

No other significant testicular abnormalities.

BILATERAL varicoceles.

## 2021-03-15 ENCOUNTER — Other Ambulatory Visit: Payer: Self-pay | Admitting: *Deleted

## 2021-03-15 NOTE — Patient Outreach (Deleted)
[facility]) Care Management  03/15/2021  RUSTON FEDORA 1941/07/18 [id number]  Unsuccessful outreach attempt made to patient. RN Health Coach left HIPAA compliant voicemail message along with her contact information.  Plan: RN Health Coach will call patient within the month of July.  [name] RN, BSN [facility] Care Management  RN Health Coach [phone] [email or web address]

## 2021-03-15 NOTE — Patient Instructions (Addendum)
Goals Addressed             This Visit's Progress    (THN) Monitor and Manage My Blood Sugar       Timeframe:  Long-Range Goal Priority:  High Start Date:  07/24/20                           Expected End Date:  08/21/21                     Follow Up Date 06/21/21    - check blood sugar at prescribed times - check blood sugar if I feel it is too high or too low - enter blood sugar readings and medication or insulin into daily log    Why is this important?   Checking your blood sugar at home helps to keep it from getting very high or very low.  Writing the results in a diary or log helps the doctor know how to care for you.  Your blood sugar log should have the time, date and the results.  Also, write down the amount of insulin or other medicine that you take.  Other information, like what you ate, exercise done and how you were feeling, will also be helpful.     Notes: Patient states he takes his blood sugar 2-3 times weekly. He has maintained his A1c below 6.5 since 2018 Updated 10/15/20: Patient reports taking his blood sugar every other day. His last A1c was 6.2.  Updated 01/21/21: Patient reports his blood sugar ranged are 100-125. His last A1c was 6.2 on 09/24/20.  Updated 03/15/21: Patient explains that he is feeling good about his diabetes control. He takes his blood sugar every other day and his ranges are 100-130. His last A1c was 6.2 on 09/24/20 and he has an upcoming appointment with his PCP in July.      [facility]) Obtain Eye Exam       Timeframe:  Long-Range Goal Priority:  High Start Date:  07/24/20                           Expected End Date: 08/21/21                     Follow Up Date 06/21/21    - schedule appointment with eye doctor    Why is this important?   Eye check-ups are important when you have diabetes.  Vision loss can be prevented.    Notes: Patient states he last had an eye exam on 12/20. He was supposed to have cataract surgery and then became ill with  covid. Patient has since felt weak and has not scheduled his cataract surgery for fear of being put to sleep. Nurse encouraged patient to make an eye exam appointment due to keeping his annual diabetic eye exam and following up on cataracts.   Update: 10/15/20 Patient states he is feeling stronger and that he will make an eye appointment with Dr. Elmer Picker.  Update 03/15/21: Patient reports that he does intend to make an eye appointment soon. Adding that he realizes he needs to take care of his eye health and follow up with having cataracts.

## 2021-03-15 NOTE — Patient Outreach (Signed)
[facility]) Care Management  [facility] Care Manager  03/15/2021   Walter Baxter [birth year] [id number]  Subjective: Successful telephone outreach call to patient. HIPAA identifiers obtained. Patient reports that he is slowly accepting the death of his wife. He continues to have good support from his church and is going to church regularly on Sunday and Wednesday. Patient recently got back from visiting his niece in the [place] area and states he feels like he was able to relax and enjoy himself. His other niece and nephew who live closer to him continue to visit often and his friend Rocky Link who is local is supportive as well. Patient explains that he is feeling good about his diabetes control. He takes his blood sugar every other day and his ranges are 100-130. His last A1c was 6.2 on 09/24/20 and he has an upcoming appointment with his PCP in July. Patient reports that he does intend to make an eye appointment soon. Adding that he realizes he needs to take care of his eye health and follow up with having cataracts.Patient did not have any further questions or concerns today and did confirm that the he this nurse's contact number to call her if needed.   Encounter Medications:  Outpatient Encounter Medications as of 03/15/2021  Medication Sig Note   acetaminophen (TYLENOL) 500 MG tablet Take 1,000 mg by mouth every 6 (six) hours as needed for mild pain or headache.    aspirin EC 81 MG tablet Take 81 mg by mouth daily.    clonazePAM (KLONOPIN) 0.5 MG tablet Take 0.5 mg by mouth daily as needed.    famotidine (PEPCID) 20 MG tablet Take 20 mg by mouth 2 (two) times daily.    levothyroxine (SYNTHROID) 25 MCG tablet Take 25 mcg by mouth every morning.    metoprolol tartrate (LOPRESSOR) 50 MG tablet Take 50 mg by mouth 2 (two) times daily.    Multiple Vitamins-Minerals (MULTIVITAMIN WITH MINERALS) tablet Take 1 tablet by mouth daily.    pravastatin (PRAVACHOL) 20 MG tablet Take 20 mg by  mouth daily.    tamsulosin (FLOMAX) 0.4 MG CAPS capsule Take 0.4 mg by mouth daily.    guaiFENesin (MUCINEX) 600 MG 12 hr tablet Take 600 mg by mouth 2 (two) times daily as needed for cough or to loosen phlegm. (Patient not taking: No sig reported) 01/21/2021: completed   montelukast (SINGULAIR) 10 MG tablet Take 10 mg by mouth at bedtime. (Patient not taking: No sig reported) 01/21/2021: completed   promethazine-codeine (PHENERGAN WITH CODEINE) 6.25-10 MG/5ML syrup Take 5 mLs by mouth every 6 (six) hours as needed for cough. (Patient not taking: No sig reported) 01/21/2021: completed   Rivaroxaban 15 & 20 MG TBPK Follow package directions: Take one 15mg  tablet by mouth twice a day. On day 22, switch to one 20mg  tablet once a day. Take with food. (Patient not taking: No sig reported) 01/21/2021: completed   No facility-administered encounter medications on file as of 03/15/2021.    Functional Status:  No flowsheet data found.  Fall/Depression Screening: Fall Risk  03/15/2021 01/21/2021 10/15/2020  Falls in the past year? 0 0 0  Number falls in past yr: 0 0 0  Injury with Fall? 0 0 0  Risk for fall due to : Impaired mobility;Impaired balance/gait Impaired mobility;Impaired balance/gait Impaired balance/gait;Impaired mobility  Risk for fall due to: Comment - - -  Follow up Falls prevention discussed;Education provided;Falls evaluation completed Falls prevention discussed;Education provided;Falls evaluation completed Falls prevention discussed;Education  provided;Falls evaluation completed   PHQ 2/9 Scores 03/15/2021 07/24/2020 12/22/2019  PHQ - 2 Score 1 0 0    Assessment:   Care Plan There are no care plans that you recently modified to display for this patient.    Goals Addressed             This Visit's Progress    (THN) Monitor and Manage My Blood Sugar       Timeframe:  Long-Range Goal Priority:  High Start Date:  07/24/20                           Expected End Date:  08/21/21                      Follow Up Date 06/21/21    - check blood sugar at prescribed times - check blood sugar if I feel it is too high or too low - enter blood sugar readings and medication or insulin into daily log    Why is this important?   Checking your blood sugar at home helps to keep it from getting very high or very low.  Writing the results in a diary or log helps the doctor know how to care for you.  Your blood sugar log should have the time, date and the results.  Also, write down the amount of insulin or other medicine that you take.  Other information, like what you ate, exercise done and how you were feeling, will also be helpful.     Notes: Patient states he takes his blood sugar 2-3 times weekly. He has maintained his A1c below 6.5 since 2018 Updated 10/15/20: Patient reports taking his blood sugar every other day. His last A1c was 6.2.  Updated 01/21/21: Patient reports his blood sugar ranged are 100-125. His last A1c was 6.2 on 09/24/20.  Updated 03/15/21: Patient explains that he is feeling good about his diabetes control. He takes his blood sugar every other day and his ranges are 100-130. His last A1c was 6.2 on 09/24/20 and he has an upcoming appointment with his PCP in July.      [facility]) Obtain Eye Exam       Timeframe:  Long-Range Goal Priority:  High Start Date:  07/24/20                           Expected End Date: 08/21/21                     Follow Up Date 06/21/21    - schedule appointment with eye doctor    Why is this important?   Eye check-ups are important when you have diabetes.  Vision loss can be prevented.    Notes: Patient states he last had an eye exam on 12/20. He was supposed to have cataract surgery and then became ill with covid. Patient has since felt weak and has not scheduled his cataract surgery for fear of being put to sleep. Nurse encouraged patient to make an eye exam appointment due to keeping his annual diabetic eye exam and following up on cataracts.    Update: 10/15/20 Patient states he is feeling stronger and that he will make an eye appointment with Dr. Elmer Picker.  Update 03/15/21: Patient reports that he does intend to make an eye appointment soon. Adding that he realizes he needs to take care of his  eye health and follow up with having cataracts.        Plan: RN Health Coach will call patient within the month of September. Follow-up: Patient agrees to Care Plan and Follow-up.  [name] RN, BSN [facility] Care Management  RN Health Coach [phone] [email or web address]

## 2021-03-27 DIAGNOSIS — Z0000 Encounter for general adult medical examination without abnormal findings: Secondary | ICD-10-CM | POA: Diagnosis not present

## 2021-03-27 DIAGNOSIS — Z8616 Personal history of COVID-19: Secondary | ICD-10-CM | POA: Diagnosis not present

## 2021-03-27 DIAGNOSIS — E039 Hypothyroidism, unspecified: Secondary | ICD-10-CM | POA: Diagnosis not present

## 2021-03-27 DIAGNOSIS — E756 Lipid storage disorder, unspecified: Secondary | ICD-10-CM | POA: Diagnosis not present

## 2021-03-27 DIAGNOSIS — J42 Unspecified chronic bronchitis: Secondary | ICD-10-CM | POA: Diagnosis not present

## 2021-03-27 DIAGNOSIS — E1169 Type 2 diabetes mellitus with other specified complication: Secondary | ICD-10-CM | POA: Diagnosis not present

## 2021-03-27 DIAGNOSIS — E782 Mixed hyperlipidemia: Secondary | ICD-10-CM | POA: Diagnosis not present

## 2021-03-27 DIAGNOSIS — I10 Essential (primary) hypertension: Secondary | ICD-10-CM | POA: Diagnosis not present

## 2021-03-27 DIAGNOSIS — J309 Allergic rhinitis, unspecified: Secondary | ICD-10-CM | POA: Diagnosis not present

## 2021-05-28 DIAGNOSIS — K649 Unspecified hemorrhoids: Secondary | ICD-10-CM | POA: Diagnosis not present

## 2021-05-31 DIAGNOSIS — K649 Unspecified hemorrhoids: Secondary | ICD-10-CM | POA: Diagnosis not present

## 2021-06-20 ENCOUNTER — Other Ambulatory Visit: Payer: Self-pay | Admitting: *Deleted

## 2021-06-20 NOTE — Patient Outreach (Signed)
[facility]) Care Management  [facility] Care Manager  06/20/2021   Walter Baxter March 15, 1941 [id number]  Subjective: Successful telephone outreach call to patient. HIPAA identifiers obtained. Patient reports that he is slowly accepting the death of his wife. He continues to have good support from his Renato Gails, church, nieces and nephews, and friend Rocky Link. Patient recently got back from visiting his niece in the [place] area and states he will visit his other niece who lives in [place], [place] in October. Nurse discussed with patient his health goals and his health and wellness needs which were documented in the Epic system. Patient did not have any further questions or concerns today and did confirm that he has this nurse's contact number to call her if needed.   Encounter Medications:  Outpatient Encounter Medications as of 06/20/2021  Medication Sig Note   acetaminophen (TYLENOL) 500 MG tablet Take 1,000 mg by mouth every 6 (six) hours as needed for mild pain or headache.    aspirin EC 81 MG tablet Take 81 mg by mouth daily.    clonazePAM (KLONOPIN) 0.5 MG tablet Take 0.5 mg by mouth daily as needed.    famotidine (PEPCID) 20 MG tablet Take 20 mg by mouth 2 (two) times daily.    levothyroxine (SYNTHROID) 25 MCG tablet Take 25 mcg by mouth every morning.    metoprolol tartrate (LOPRESSOR) 50 MG tablet Take 50 mg by mouth 2 (two) times daily.    Multiple Vitamins-Minerals (MULTIVITAMIN WITH MINERALS) tablet Take 1 tablet by mouth daily.    pravastatin (PRAVACHOL) 20 MG tablet Take 20 mg by mouth daily.    tamsulosin (FLOMAX) 0.4 MG CAPS capsule Take 0.4 mg by mouth daily.    guaiFENesin (MUCINEX) 600 MG 12 hr tablet Take 600 mg by mouth 2 (two) times daily as needed for cough or to loosen phlegm. (Patient not taking: No sig reported) 06/20/2021: completed   montelukast (SINGULAIR) 10 MG tablet Take 10 mg by mouth at bedtime. (Patient not taking: No sig reported) 06/20/2021:  completed   promethazine-codeine (PHENERGAN WITH CODEINE) 6.25-10 MG/5ML syrup Take 5 mLs by mouth every 6 (six) hours as needed for cough. (Patient not taking: No sig reported) 06/20/2021: completed   Rivaroxaban 15 & 20 MG TBPK Follow package directions: Take one 15mg  tablet by mouth twice a day. On day 22, switch to one 20mg  tablet once a day. Take with food. (Patient not taking: No sig reported) 06/20/2021: completed   No facility-administered encounter medications on file as of 06/20/2021.    Functional Status:  No flowsheet data found.  Fall/Depression Screening: Fall Risk  06/20/2021 03/15/2021 01/21/2021  Falls in the past year? 0 0 0  Number falls in past yr: 0 0 0  Injury with Fall? 0 0 0  Risk for fall due to : - Impaired mobility;Impaired balance/gait Impaired mobility;Impaired balance/gait  Risk for fall due to: Comment - - -  Follow up Falls prevention discussed;Falls evaluation completed Falls prevention discussed;Education provided;Falls evaluation completed Falls prevention discussed;Education provided;Falls evaluation completed   PHQ 2/9 Scores 03/15/2021 07/24/2020 12/22/2019  PHQ - 2 Score 1 0 0    Assessment:   Care Plan Care Plan : Diabetes Type 2 (Adult)  Updates made by 13/10/2019, RN since 06/20/2021 12:00 AM     Problem: Glycemic Management (Diabetes, Type 2)   Priority: Medium     Long-Range Goal: Glycemic Management Optimized   Start Date: 07/24/2020  Expected End Date: 03/21/2022  Note:  Evidence-based guidance:  Anticipate A1C testing (point-of-care) every 3 to 6 months based on goal attainment.  Review mutually-set A1C goal or target range.  Anticipate use of antihyperglycemic with or without insulin and periodic adjustments; consider active involvement of pharmacist.  Provide medical nutrition therapy and development of individualized eating.  Compare self-reported symptoms of hypo or hyperglycemia to blood glucose levels, diet and fluid intake, current  medications, psychosocial and physiologic stressors, change in activity and barriers to care adherence.  Promote self-monitoring of blood glucose levels.  Assess and address barriers to management plan, such as food insecurity, age, developmental ability, depression, anxiety, fear of hypoglycemia or weight gain, as well as medication cost, side effects and complicated regimen.  Consider referral to community-based diabetes education program, visiting nurse, community health worker or health coach.  Encourage regular dental care for treatment of periodontal disease; refer to dental provider when needed.   Notes:     Task: Alleviate Barriers to Glycemic Management   Due Date: 03/21/2022  Note:   Care Management Activities:    - blood glucose monitoring encouraged - blood glucose readings reviewed - dental care encouraged - use of blood glucose monitoring log promoted    Notes: Patient has maintained A1c below 6.5 since 2018    Problem: Disease Progression (Diabetes, Type 2)   Priority: Medium     Long-Range Goal: Disease Progression Prevented or Minimized   Start Date: 07/24/2020  Expected End Date: 03/21/2022  Note:   Evidence-based guidance:  Prepare patient for laboratory and diagnostic exams based on risk and presentation.  Encourage lifestyle changes, such as increased intake of plant-based foods, stress reduction, consistent physical activity and smoking cessation to prevent long-term complications and chronic disease.   Individualize activity and exercise recommendations while considering potential limitations, such as neuropathy, retinopathy or the ability to prevent hyperglycemia or hypoglycemia.   Prepare patient for use of pharmacologic therapy that may include antihypertensive, analgesic, prostaglandin E1 with periodic adjustments, based on presenting chronic condition and laboratory results.  Assess signs/symptoms and risk factors for hypertension, sleep-disordered  breathing, neuropathy (including changes in gait and balance), retinopathy, nephropathy and sexual dysfunction.  Address pregnancy planning and contraceptive choice, especially when prescribing antihypertensive or statin.  Ensure completion of annual comprehensive foot exam and dilated eye exam.   Implement additional individualized goals and interventions based on identified risk factors.  Prepare patient for consultation or referral for specialist care, such as ophthalmology, neurology, cardiology, podiatry, nephrology or perinatology.   Notes:     Task: Monitor and Manage Follow-up for Comorbidities   Due Date: 03/21/2022  Note:   Care Management Activities:    - activity based on tolerance and functional limitations encouraged - completion of annual dilated eye exam confirmed - completion of annual foot exam verified - healthy lifestyle promoted - quality of sleep assessed - reduction of sedentary activity encouraged - signs/symptoms of comorbidities identified - vital signs and trends reviewed    Notes:     Care Plan : Hypertension (Adult)  Updates made by Wanda Plump, RN since 06/20/2021 12:00 AM     Problem: Hypertension (Hypertension)   Priority: Medium     Long-Range Goal: Hypertension Monitored   Start Date: 06/20/2021  Expected End Date: 06/21/2022  Note:   Evidence-based guidance:  Promote initial use of ambulatory blood pressure measurements (for 3 days) to rule out "white-coat" effect; identify masked hypertension and presence or absence of nocturnal "dipping" of blood pressure.   Encourage continued use of  home blood pressure monitoring and recording in blood pressure log; include symptoms of hypotension or potential medication side effects in log.  Review blood pressure measurements taken inside and outside of the provider office; establish baseline and monitor trends; compare to target ranges or patient goal.  Share overall cardiovascular risk with patient;  encourage changes to lifestyle risk factors, including alcohol consumption, smoking, inadequate exercise, poor dietary habits and stress.   Notes:     Task: Identify and Monitor Blood Pressure Elevation   Due Date: 06/21/2022  Note:   Care Management Activities:    - blood pressure trends reviewed - home or ambulatory blood pressure monitoring encouraged    Notes:       Goals Addressed             This Visit's Progress    ([facility]) Monitor and Manage My Blood Sugar       Timeframe:  Long-Range Goal Priority:  High Start Date:  07/24/20                           Expected End Date:  03/21/22                   Follow Up Date 09/20/21    - check blood sugar at prescribed times - check blood sugar if I feel it is too high or too low - enter blood sugar readings and medication or insulin into daily log    Why is this important?   Checking your blood sugar at home helps to keep it from getting very high or very low.  Writing the results in a diary or log helps the doctor know how to care for you.  Your blood sugar log should have the time, date and the results.  Also, write down the amount of insulin or other medicine that you take.  Other information, like what you ate, exercise done and how you were feeling, will also be helpful.     Notes: Patient states he takes his blood sugar 2-3 times weekly. He has maintained his A1c below 6.5 since 2018 Updated 10/15/20: Patient reports taking his blood sugar every other day. His last A1c was 6.2.  Updated 01/21/21: Patient reports his blood sugar ranged are 100-125. His last A1c was 6.2 on 09/24/20.  Updated 03/15/21: Patient explains that he is feeling good about his diabetes control. He takes his blood sugar every other day and his ranges are 100-130. His last A1c was 6.2 on 09/24/20 and he has an upcoming appointment with his PCP in July.  Updated 06/20/21: Patient reports he continues to take his blood sugar every other day. His ranges are  90-130. Patient's last A1c was 5.6 on 03/27/21.     [facility]) Obtain Eye Exam       Timeframe:  Long-Range Goal Priority:  High Start Date:  07/24/20                           Expected End Date: 09/20/21                     Follow Up Date 09/20/21    - keep appointment with eye doctor    Why is this important?   Eye check-ups are important when you have diabetes.  Vision loss can be prevented.    Notes: Patient states he last had an eye exam on 12/20.  He was supposed to have cataract surgery and then became ill with covid. Patient has since felt weak and has not scheduled his cataract surgery for fear of being put to sleep. Nurse encouraged patient to make an eye exam appointment due to keeping his annual diabetic eye exam and following up on cataracts.   Update: 10/15/20 Patient states he is feeling stronger and that he will make an eye appointment with Dr. Elmer Picker.  Update 03/15/21: Patient reports that he does intend to make an eye appointment soon. Adding that he realizes he needs to take care of his eye health and follow up with having cataracts.  Updated 06/20/21: Patient reports that he has a scheduled appointment with Dr. Elmer Picker 12/22.     [facility]) Track and Manage My Blood Pressure-Hypertension       Timeframe:  Long-Range Goal Priority:  Medium Start Date: 06/20/21                            Expected End Date: 06/21/22                      Follow Up Date 09/20/21    - check blood pressure daily - write blood pressure results in a log or diary -Encouraged patient to continue to eat a heart healthy and low sodium diet -Encouraged patient to continue to stay active by doing his yard work and house-hold chores   Why is this important?   You won't feel high blood pressure, but it can still hurt your blood vessels.  High blood pressure can cause heart or kidney problems. It can also cause a stroke.  Making lifestyle changes like losing a little weight or eating less salt will help.   Checking your blood pressure at home and at different times of the day can help to control blood pressure.  If the doctor prescribes medicine remember to take it the way the doctor ordered.  Call the office if you cannot afford the medicine or if there are questions about it.     Notes: 06/20/21: Patient states that he takes his B/P daily and records the values. His systolic rangers are 135-150 and his diastolic ranges are 70-80's. Patient reports that he is working with Dr. Clelia Croft to improve his blood pressure and has an upcoming appointment with her in October. Nurse will send patient hypertension education.       Plan: RN Health Coach will  send PCP a quarterly update, will send patient hypertension education, and will call patient within the month of December. Follow-up: Patient agrees to Care Plan and Follow-up.  [name] RN, BSN [facility] Care Management  RN Health Coach [phone] [email or web address]

## 2021-06-20 NOTE — Patient Instructions (Signed)
Goals Addressed             This Visit's Progress    ([facility]) Monitor and Manage My Blood Sugar       Timeframe:  Long-Range Goal Priority:  High Start Date:  07/24/20                           Expected End Date:  03/21/22                   Follow Up Date 09/20/21    - check blood sugar at prescribed times - check blood sugar if I feel it is too high or too low - enter blood sugar readings and medication or insulin into daily log    Why is this important?   Checking your blood sugar at home helps to keep it from getting very high or very low.  Writing the results in a diary or log helps the doctor know how to care for you.  Your blood sugar log should have the time, date and the results.  Also, write down the amount of insulin or other medicine that you take.  Other information, like what you ate, exercise done and how you were feeling, will also be helpful.     Notes: Patient states he takes his blood sugar 2-3 times weekly. He has maintained his A1c below 6.5 since 2018 Updated 10/15/20: Patient reports taking his blood sugar every other day. His last A1c was 6.2.  Updated 01/21/21: Patient reports his blood sugar ranged are 100-125. His last A1c was 6.2 on 09/24/20.  Updated 03/15/21: Patient explains that he is feeling good about his diabetes control. He takes his blood sugar every other day and his ranges are 100-130. His last A1c was 6.2 on 09/24/20 and he has an upcoming appointment with his PCP in July.  Updated 06/20/21: Patient reports he continues to take his blood sugar every other day. His ranges are 90-130. Patient's last A1c was 5.6 on 03/27/21.     [facility]) Obtain Eye Exam       Timeframe:  Long-Range Goal Priority:  High Start Date:  07/24/20                           Expected End Date: 09/20/21                     Follow Up Date 09/20/21    - keep appointment with eye doctor    Why is this important?   Eye check-ups are important when you have diabetes.  Vision loss can  be prevented.    Notes: Patient states he last had an eye exam on 12/20. He was supposed to have cataract surgery and then became ill with covid. Patient has since felt weak and has not scheduled his cataract surgery for fear of being put to sleep. Nurse encouraged patient to make an eye exam appointment due to keeping his annual diabetic eye exam and following up on cataracts.   Update: 10/15/20 Patient states he is feeling stronger and that he will make an eye appointment with Dr. Elmer Picker.  Update 03/15/21: Patient reports that he does intend to make an eye appointment soon. Adding that he realizes he needs to take care of his eye health and follow up with having cataracts.  Updated 06/20/21: Patient reports that he has a scheduled appointment with Dr. Elmer Picker  12/22.     [facility]) Track and Manage My Blood Pressure-Hypertension       Timeframe:  Long-Range Goal Priority:  Medium Start Date: 06/20/21                            Expected End Date: 06/21/22                      Follow Up Date 09/20/21    - check blood pressure daily - write blood pressure results in a log or diary -Encouraged patient to continue to eat a heart healthy and low sodium diet -Encouraged patient to continue to stay active by doing his yard work and house-hold chores   Why is this important?   You won't feel high blood pressure, but it can still hurt your blood vessels.  High blood pressure can cause heart or kidney problems. It can also cause a stroke.  Making lifestyle changes like losing a little weight or eating less salt will help.  Checking your blood pressure at home and at different times of the day can help to control blood pressure.  If the doctor prescribes medicine remember to take it the way the doctor ordered.  Call the office if you cannot afford the medicine or if there are questions about it.     Notes: 06/20/21: Patient states that he takes his B/P daily and records the values. His systolic rangers are  135-150 and his diastolic ranges are 70-80's. Patient reports that he is working with Dr. Clelia Croft to improve his blood pressure and has an upcoming appointment with her in October. Nurse will send patient hypertension education.

## 2021-07-27 DIAGNOSIS — H6692 Otitis media, unspecified, left ear: Secondary | ICD-10-CM | POA: Diagnosis not present

## 2021-08-22 ENCOUNTER — Other Ambulatory Visit: Payer: Self-pay | Admitting: *Deleted

## 2021-08-22 NOTE — Patient Outreach (Signed)
[facility]) Care Management  08/22/2021  ROSEVELT LUU 12-19-1940 [id number]  Unsuccessful outreach attempt made to patient. RN Health Coach left HIPAA compliant voicemail message along with her contact information.  Plan: RN Health Coach will call patient within the month of January.  [name] RN, BSN [facility] Care Management  RN Health Coach [phone] [email or web address]

## 2021-10-02 ENCOUNTER — Encounter: Payer: Self-pay | Admitting: *Deleted

## 2021-10-02 ENCOUNTER — Other Ambulatory Visit: Payer: Self-pay | Admitting: *Deleted

## 2021-10-02 DIAGNOSIS — E1169 Type 2 diabetes mellitus with other specified complication: Secondary | ICD-10-CM | POA: Diagnosis not present

## 2021-10-02 DIAGNOSIS — I10 Essential (primary) hypertension: Secondary | ICD-10-CM | POA: Diagnosis not present

## 2021-10-02 DIAGNOSIS — E039 Hypothyroidism, unspecified: Secondary | ICD-10-CM | POA: Diagnosis not present

## 2021-10-02 DIAGNOSIS — E782 Mixed hyperlipidemia: Secondary | ICD-10-CM | POA: Diagnosis not present

## 2021-10-02 NOTE — Patient Outreach (Addendum)
[facility]) Care Management  10/02/2021  BODHI MORADI 1941/07/25 [id number]  [facility]) Care Management RN Health Coach Note   10/02/2021 Name:  Walter Baxter MRN:  [id number] DOB:  Jan 11, 1941  Summary: Patient reports he is feeling good. Patient states he had his annual visit with PCP today and got a great report. Patient reports that his blood sugar ranges are 37-342 and his systolic B/P ranges are in the 130's or below. He continues to adhere to a diabetic, low sodium diet and his last A1c was 5.6. Patient explains that he continues to visit his family out of town often and will leave to go visit his niece in the [place] area within the next week. Patient did not have any further questions or concerns today and did confirm that he has this nurse's contact number to call her if needed.   Recommendations/Changes made from today's visit: check blood sugar at prescribed times: once daily check blood pressure daily report new symptoms to your doctor Eat a low sodium, low sugar, and low carbohydrate diet Continue to stay active by not sitting for long periods, doing yard work, and house work  Subjective: Walter Baxter is an 81 y.o. year old male who is a primary patient of Mayra Neer, MD. The care management team was consulted for assistance with care management and/or care coordination needs.    RN Health Coach completed Telephone Visit today.   Objective:  Medications Reviewed Today     Reviewed by Michiel Cowboy, RN (Registered Nurse) on 10/02/21 at 39  Med List Status: <None>   Medication Order Taking? Sig Documenting Provider Last Dose Status Informant  acetaminophen (TYLENOL) 500 MG tablet [id number] Yes Take 1,000 mg by mouth every 6 (six) hours as needed for mild pain or headache. [provider] Taking Active Spouse/Significant Other  aspirin EC 81 MG tablet [id number] Yes Take 81 mg by mouth daily. [provider] Taking Active Spouse/Significant Other  clonazePAM (KLONOPIN) 0.5 MG tablet [id number] Yes Take 0.5 mg by mouth daily as needed. [provider] Taking Active   famotidine (PEPCID) 20 MG tablet [id number] Yes Take 20 mg by mouth 2 (two) times daily. [provider] Taking Active Spouse/Significant Other  guaiFENesin (MUCINEX) 600 MG 12 hr tablet [id number] No Take 600 mg by mouth 2 (two) times daily as needed for cough or to loosen phlegm.  Patient not taking: Reported on 10/02/2021   [provider] Not Taking Active            Med Note Laretta Alstrom, Elspeth Blucher A   Thu Jun 20, 2021  1:40 PM) completed  levothyroxine (SYNTHROID) 25 MCG tablet [id number] Yes Take 25 mcg by mouth every morning. [provider] Taking Active   metoprolol tartrate (LOPRESSOR) 50 MG tablet [id number] Yes Take 50 mg by mouth 2 (two) times daily. [provider] Taking Active Spouse/Significant Other  montelukast (SINGULAIR) 10 MG tablet [id number] No Take 10 mg by mouth at bedtime.  Patient not taking: Reported on 10/02/2021   [provider] Not Taking Active            Med Note Laretta Alstrom, Glenn Christo A   Thu Jun 20, 2021  1:40 PM) completed  Multiple Vitamins-Minerals (MULTIVITAMIN WITH MINERALS) tablet [id number] Yes Take 1 tablet by mouth daily. [provider] Taking Active Self  pravastatin (PRAVACHOL) 20 MG tablet [id number] Yes Take 20 mg by mouth daily. [provider] Taking Active  promethazine-codeine (PHENERGAN WITH CODEINE) 6.25-10 MG/5ML syrup [id number] No Take 5 mLs by mouth every 6 (six) hours as needed for cough.  Patient not taking: Reported on 07/24/2020   [provider] Not Taking Active            Med Note Laretta Alstrom, Joby Hershkowitz A   Thu Jun 20, 2021  1:40 PM) completed  Rivaroxaban 15 & 20 MG TBPK [id number] No Follow package directions: Take one 18m tablet by mouth twice a day. On day 22, switch to one 221mtablet once a day. Take with food.   Patient not taking: Reported on 07/24/2020   Arrien, MaJimmy PicketMD Not Taking Active            Med Note (WLaretta AlstromJILL A   Thu Jun 20, 2021  1:41 PM) completed  tamsulosin (FLOMAX) 0.4 MG CAPS capsule [id number] Take 0.4 mg by mouth daily. [provider] Taking Active              SDOH:  (Social Determinants of Health) assessments and interventions performed: SDOH assessments completed today and documented in the Epic system.     Care Plan  Review of patient past medical history, allergies, medications, health status, including review of consultants reports, laboratory and other test data, was performed as part of comprehensive evaluation for care management services.   Care Plan : Diabetes Type 2 (Adult)  Updates made by WiMichiel CowboyRN since 10/02/2021 12:00 AM     Problem: Glycemic Management (Diabetes, Type 2) Resolved 10/02/2021  Priority: Medium     Long-Range Goal: Glycemic Management Optimized Completed 10/02/2021  Start Date: 07/24/2020  Expected End Date: 03/21/2022  Note:   Resolving due to duplicate goal  Evidence-based guidance:  Anticipate A1C testing (point-of-care) every 3 to 6 months based on goal attainment.  Review mutually-set A1C goal or target range.  Anticipate use of antihyperglycemic with or without insulin and periodic adjustments; consider active involvement of pharmacist.  Provide medical nutrition therapy and development of individualized eating.  Compare self-reported symptoms of hypo or hyperglycemia to blood glucose levels, diet and fluid intake, current medications, psychosocial and physiologic stressors, change in activity and barriers to care adherence.  Promote self-monitoring of blood glucose levels.  Assess and address barriers to management plan, such as food insecurity, age, developmental ability, depression, anxiety, fear of hypoglycemia or weight gain, as well as medication cost, side effects and complicated regimen.   Consider referral to community-based diabetes education program, visiting nurse, community health worker or health coach.  Encourage regular dental care for treatment of periodontal disease; refer to dental provider when needed.   Notes:     Task: Alleviate Barriers to Glycemic Management Completed 10/02/2021  Due Date: 03/21/2022  Note:   Care Management Activities:    - blood glucose monitoring encouraged - blood glucose readings reviewed - dental care encouraged - use of blood glucose monitoring log promoted    Notes: Patient has maintained A1c below 6.5 since 2018    Problem: Disease Progression (Diabetes, Type 2) Resolved 10/02/2021  Priority: Medium     Long-Range Goal: Disease Progression Prevented or Minimized Completed 10/02/2021  Start Date: 07/24/2020  Expected End Date: 03/21/2022  Note:   Resolving due to duplicate goal  Evidence-based guidance:  Prepare patient for laboratory and diagnostic exams based on risk and presentation.  Encourage lifestyle changes, such as increased intake of plant-based foods, stress reduction, consistent physical activity and smoking cessation to prevent long-term complications and  chronic disease.   Individualize activity and exercise recommendations while considering potential limitations, such as neuropathy, retinopathy or the ability to prevent hyperglycemia or hypoglycemia.   Prepare patient for use of pharmacologic therapy that may include antihypertensive, analgesic, prostaglandin E1 with periodic adjustments, based on presenting chronic condition and laboratory results.  Assess signs/symptoms and risk factors for hypertension, sleep-disordered breathing, neuropathy (including changes in gait and balance), retinopathy, nephropathy and sexual dysfunction.  Address pregnancy planning and contraceptive choice, especially when prescribing antihypertensive or statin.  Ensure completion of annual comprehensive foot exam and dilated eye exam.    Implement additional individualized goals and interventions based on identified risk factors.  Prepare patient for consultation or referral for specialist care, such as ophthalmology, neurology, cardiology, podiatry, nephrology or perinatology.   Notes:     Task: Monitor and Manage Follow-up for Comorbidities Completed 10/02/2021  Due Date: 03/21/2022  Note:   Care Management Activities:    - activity based on tolerance and functional limitations encouraged - completion of annual dilated eye exam confirmed - completion of annual foot exam verified - healthy lifestyle promoted - quality of sleep assessed - reduction of sedentary activity encouraged - signs/symptoms of comorbidities identified - vital signs and trends reviewed    Notes:     Care Plan : Hypertension (Adult)  Updates made by Michiel Cowboy, RN since 10/02/2021 12:00 AM     Problem: Hypertension (Hypertension) Resolved 10/02/2021  Priority: Medium     Long-Range Goal: Hypertension Monitored Completed 10/02/2021  Start Date: 06/20/2021  Expected End Date: 06/21/2022  Note:   Resolving due to duplicate goal  Evidence-based guidance:  Promote initial use of ambulatory blood pressure measurements (for 3 days) to rule out "white-coat" effect; identify masked hypertension and presence or absence of nocturnal "dipping" of blood pressure.   Encourage continued use of home blood pressure monitoring and recording in blood pressure log; include symptoms of hypotension or potential medication side effects in log.  Review blood pressure measurements taken inside and outside of the provider office; establish baseline and monitor trends; compare to target ranges or patient goal.  Share overall cardiovascular risk with patient; encourage changes to lifestyle risk factors, including alcohol consumption, smoking, inadequate exercise, poor dietary habits and stress.   Notes:     Task: Identify and Monitor Blood Pressure Elevation  Completed 10/02/2021  Due Date: 06/21/2022  Note:   Care Management Activities:    - blood pressure trends reviewed - home or ambulatory blood pressure monitoring encouraged    Notes:     Care Plan : [place] of Care  Updates made by Michiel Cowboy, RN since 10/02/2021 12:00 AM     Problem: Knowledge Deficit Related to Diabetes and Hypertension   Priority: High     Long-Range Goal: Development of Plan of Care for Management of Diabetes and Hypertension   Start Date: 10/02/2021  Expected End Date: 10/21/2021  Priority: High  Note:   Current Barriers:  Chronic Disease Management support and education needs related to HTN and DMII   RNCM Clinical Goal(s):  Patient will demonstrate Ongoing health management independence as evidenced by maintaining A1c below 6.5 and B/P < 150/90 continue to work with RN Care Manager to address care management and care coordination needs related to  HTN and DMII as evidenced by adherence to CM Team Scheduled appointments through collaboration with RN Care manager, provider, and care team.   Interventions: Inter-disciplinary care team collaboration (see longitudinal plan of care)  Evaluation of current treatment plan related to  self management and patient's adherence to plan as established by provider   Diabetes Interventions:  (Status:  Goal on track:  Yes.) Long Term Goal Assessed patient's understanding of A1c goal: <6.5% Provided education to patient about basic DM disease process Reviewed medications with patient and discussed importance of medication adherence Discussed plans with patient for ongoing care management follow up and provided patient with direct contact information for care management team Advised patient, providing education and rationale, to check cbg daily and record, calling PCP for findings outside established parameters Encouraged patient to maintain a low sugar and carbohydrate diet Encouraged patient to continue to  stay active by not sitting for long periods, doing yard work, and house work Lab Results  Component Value Date   HGBA1C 6.1 (H) 10/20/2019   Hypertension Interventions:  (Status:  Goal on track:  Yes.) Long Term Goal Last practice recorded BP readings:  BP Readings from Last 3 Encounters:  11/09/19 (!) 120/52  11/05/19 134/73  07/21/19 (!) 144/80  Most recent eGFR/CrCl: No results found for: EGFR  No components found for: CRCL  Evaluation of current treatment plan related to hypertension self management and patient's adherence to plan as established by provider Provided education to patient re: stroke prevention, s/s of heart attack and stroke Reviewed medications with patient and discussed importance of compliance Provided education on prescribed diet low sodium Encouraged patient to continue to stay active by not sitting for long periods, doing yard work, and house work  Patient Goals/Self-Care Activities: Take all medications as prescribed Attend all scheduled provider appointments keep appointment with eye doctor check blood sugar at prescribed times: once daily check feet daily for cuts, sores or redness enter blood sugar readings and medication or insulin into daily log take the blood sugar log to all doctor visits set goal weight trim toenails straight across drink 6 to 8 glasses of water each day fill half of plate with vegetables check blood pressure daily write blood pressure results in a log or diary keep a blood pressure log take blood pressure log to all doctor appointments call doctor for signs and symptoms of high blood pressure take medications for blood pressure exactly as prescribed report new symptoms to your doctor Eat a low sodium, low sugar, and low carbohydrate diet Continue to stay active by not sitting for long periods, doing yard work, and house work  Follow Up Plan:  Telephone follow up appointment with care management team member scheduled for:   March       Plan: Telephone follow up appointment with care management team member scheduled for:  March . Nurse will send PCP today's assessment note.  Emelia Loron RN, [place] [phone] [email or web address]

## 2021-10-02 NOTE — Patient Instructions (Signed)
Visit Information  Thank you for taking time to visit with me today. Please don't hesitate to contact me if I can be of assistance to you before our next scheduled telephone appointment.  Following are the goals we discussed today:  Patient Goals/Self-Care Activities: Take all medications as prescribed Attend all scheduled provider appointments keep appointment with eye doctor check blood sugar at prescribed times: once daily check feet daily for cuts, sores or redness enter blood sugar readings and medication or insulin into daily log take the blood sugar log to all doctor visits set goal weight trim toenails straight across drink 6 to 8 glasses of water each day fill half of plate with vegetables check blood pressure daily write blood pressure results in a log or diary keep a blood pressure log take blood pressure log to all doctor appointments call doctor for signs and symptoms of high blood pressure take medications for blood pressure exactly as prescribed report new symptoms to your doctor Eat a low sodium, low sugar, and low carbohydrate diet Continue to stay active by not sitting for long periods, doing yard work, and house work  Patient verbalizes understanding of instructions and care plan provided today and agrees to view in [place]. Active MyChart status confirmed with patient.    Telephone follow up appointment with care management team member scheduled PPI:[id number]  [name] RN, BSN Nurse [facility] [phone] [email or web address]

## 2021-11-14 DIAGNOSIS — Z9889 Other specified postprocedural states: Secondary | ICD-10-CM | POA: Diagnosis not present

## 2021-12-17 ENCOUNTER — Other Ambulatory Visit: Payer: Self-pay | Admitting: *Deleted

## 2021-12-17 NOTE — Patient Outreach (Signed)
[facility] Customer service manager [facility]) Care Management ? ?12/17/2021 ? ?Franklyn Lor ?[birth year] ?[id number] ? ?Unsuccessful outreach attempt made to patient. RN Health Coach left HIPAA compliant voicemail message along with her contact information. ? ?Plan: ?RN Health Coach will call patient within the month of April. ? ?[name] RN, BSN ?[facility] Care Management  ?RN Health Coach ?[phone] ?[email or web address] ? ? ?

## 2021-12-25 ENCOUNTER — Other Ambulatory Visit: Payer: Self-pay | Admitting: *Deleted

## 2021-12-25 NOTE — Patient Outreach (Signed)
[facility] Customer service manager [facility]) Care Management ? ?12/25/2021 ? ?Walter Baxter ?09-Nov-1940 ?[id number] ? ?Nurse spoke with patient to inform them that their PCP has a RN CM  in their office that will continue with their care. Patient verbalized understanding.  ? ?Plan: ?RN Health Coach will close case and will send patient a case closed letter.  ? ?[name] RN, BSN ?[facility] Care Management  ?RN Health Coach ?[phone] ?[email or web address] ? ? ?

## 2022-02-26 DIAGNOSIS — R062 Wheezing: Secondary | ICD-10-CM | POA: Diagnosis not present

## 2022-02-26 DIAGNOSIS — R059 Cough, unspecified: Secondary | ICD-10-CM | POA: Diagnosis not present

## 2022-03-28 DIAGNOSIS — J309 Allergic rhinitis, unspecified: Secondary | ICD-10-CM | POA: Diagnosis not present

## 2022-03-28 DIAGNOSIS — E039 Hypothyroidism, unspecified: Secondary | ICD-10-CM | POA: Diagnosis not present

## 2022-03-28 DIAGNOSIS — E1169 Type 2 diabetes mellitus with other specified complication: Secondary | ICD-10-CM | POA: Diagnosis not present

## 2022-03-28 DIAGNOSIS — Z23 Encounter for immunization: Secondary | ICD-10-CM | POA: Diagnosis not present

## 2022-03-28 DIAGNOSIS — E756 Lipid storage disorder, unspecified: Secondary | ICD-10-CM | POA: Diagnosis not present

## 2022-03-28 DIAGNOSIS — Z0000 Encounter for general adult medical examination without abnormal findings: Secondary | ICD-10-CM | POA: Diagnosis not present

## 2022-03-28 DIAGNOSIS — J42 Unspecified chronic bronchitis: Secondary | ICD-10-CM | POA: Diagnosis not present

## 2022-03-28 DIAGNOSIS — L309 Dermatitis, unspecified: Secondary | ICD-10-CM | POA: Diagnosis not present

## 2022-03-28 DIAGNOSIS — Z8616 Personal history of COVID-19: Secondary | ICD-10-CM | POA: Diagnosis not present

## 2022-03-28 DIAGNOSIS — I10 Essential (primary) hypertension: Secondary | ICD-10-CM | POA: Diagnosis not present

## 2022-03-28 DIAGNOSIS — E782 Mixed hyperlipidemia: Secondary | ICD-10-CM | POA: Diagnosis not present

## 2022-04-01 DIAGNOSIS — H25813 Combined forms of age-related cataract, bilateral: Secondary | ICD-10-CM | POA: Diagnosis not present

## 2022-04-01 DIAGNOSIS — H35033 Hypertensive retinopathy, bilateral: Secondary | ICD-10-CM | POA: Diagnosis not present

## 2022-04-01 DIAGNOSIS — H40013 Open angle with borderline findings, low risk, bilateral: Secondary | ICD-10-CM | POA: Diagnosis not present

## 2022-04-01 DIAGNOSIS — H04123 Dry eye syndrome of bilateral lacrimal glands: Secondary | ICD-10-CM | POA: Diagnosis not present

## 2022-04-17 DIAGNOSIS — H25813 Combined forms of age-related cataract, bilateral: Secondary | ICD-10-CM | POA: Diagnosis not present

## 2022-04-17 DIAGNOSIS — H25811 Combined forms of age-related cataract, right eye: Secondary | ICD-10-CM | POA: Diagnosis not present

## 2022-04-18 DIAGNOSIS — N179 Acute kidney failure, unspecified: Secondary | ICD-10-CM | POA: Diagnosis not present

## 2022-06-11 DIAGNOSIS — H5703 Miosis: Secondary | ICD-10-CM | POA: Diagnosis not present

## 2022-06-11 DIAGNOSIS — H25811 Combined forms of age-related cataract, right eye: Secondary | ICD-10-CM | POA: Diagnosis not present

## 2022-06-11 DIAGNOSIS — H268 Other specified cataract: Secondary | ICD-10-CM | POA: Diagnosis not present

## 2022-06-26 DIAGNOSIS — Z961 Presence of intraocular lens: Secondary | ICD-10-CM | POA: Diagnosis not present

## 2022-07-02 DIAGNOSIS — H21511 Anterior synechiae (iris), right eye: Secondary | ICD-10-CM | POA: Diagnosis not present

## 2022-07-02 DIAGNOSIS — Z961 Presence of intraocular lens: Secondary | ICD-10-CM | POA: Diagnosis not present

## 2022-07-08 DIAGNOSIS — H25812 Combined forms of age-related cataract, left eye: Secondary | ICD-10-CM | POA: Diagnosis not present

## 2022-08-20 DIAGNOSIS — H268 Other specified cataract: Secondary | ICD-10-CM | POA: Diagnosis not present

## 2022-08-20 DIAGNOSIS — H25812 Combined forms of age-related cataract, left eye: Secondary | ICD-10-CM | POA: Diagnosis not present

## 2022-10-01 DIAGNOSIS — E782 Mixed hyperlipidemia: Secondary | ICD-10-CM | POA: Diagnosis not present

## 2022-10-01 DIAGNOSIS — Z23 Encounter for immunization: Secondary | ICD-10-CM | POA: Diagnosis not present

## 2022-10-01 DIAGNOSIS — E1169 Type 2 diabetes mellitus with other specified complication: Secondary | ICD-10-CM | POA: Diagnosis not present

## 2022-10-01 DIAGNOSIS — E039 Hypothyroidism, unspecified: Secondary | ICD-10-CM | POA: Diagnosis not present

## 2022-10-01 DIAGNOSIS — I10 Essential (primary) hypertension: Secondary | ICD-10-CM | POA: Diagnosis not present

## 2022-10-03 DIAGNOSIS — H59033 Cystoid macular edema following cataract surgery, bilateral: Secondary | ICD-10-CM | POA: Diagnosis not present

## 2022-10-17 NOTE — Progress Notes (Signed)
[facility] Note  10/21/2022     CHIEF COMPLAINT Patient presents for Retina Evaluation   HISTORY OF PRESENT ILLNESS: Walter Baxter is a 82 y.o. male who presents to the clinic today for:   HPI     Retina Evaluation   In both eyes.  This started 2 months ago.  Duration of 2 months.  Context:  distance vision, mid-range vision and near vision.  Treatments tried include eye drops.  Response to treatment was no improvement.  I, the attending physician,  performed the HPI with the patient and updated documentation appropriately.        Comments   Pt here for ret eval CME OS, referred by Dr. Lucianne Lei. Pt presents with blurry vision OS onset 2 months ago, s/p cataract extraction OS in Nov 2023. Pt is diabetic but his last A1C was in the 4-5 range. Does not take medications for that.       Last edited by Kingsley Spittle, COT on 10/21/2022  8:12 AM.    Pt is here on the referral of Dr. Lucianne Lei for CME OU, pt had cataract sx OD in October and OS in November, pt states he has not been able to see good since, pt states he is on PF and ketorolac QID OU since Jan. 12  Referring physician: Lisabeth Pick, MD [place],  [place] [address]  HISTORICAL INFORMATION:   Selected notes from the MEDICAL RECORD NUMBER Referred by Dr. Lucianne Lei for CME OU LEE:  Ocular Hx-  s/p CEIOL OD 09.20.23 S/p CEIOL OS 11.29.23 Currently on PF and Ketorolac QID OU -- started 01.12.24 PMH-    CURRENT MEDICATIONS: No current outpatient medications on file. (Ophthalmic Drugs)   No current facility-administered medications for this visit. (Ophthalmic Drugs)   Current Outpatient Medications (Other)  Medication Sig   acetaminophen (TYLENOL) 500 MG tablet Take 1,000 mg by mouth every 6 (six) hours as needed for mild pain or headache.   aspirin EC 81 MG tablet Take 81 mg by mouth daily.   clonazePAM (KLONOPIN) 0.5 MG tablet Take 0.5 mg by mouth daily as needed.    famotidine (PEPCID) 20 MG tablet Take 20 mg by mouth 2 (two) times daily.   levothyroxine (SYNTHROID) 25 MCG tablet Take 25 mcg by mouth every morning.   metoprolol tartrate (LOPRESSOR) 50 MG tablet Take 50 mg by mouth 2 (two) times daily.   montelukast (SINGULAIR) 10 MG tablet Take 10 mg by mouth at bedtime.   Multiple Vitamins-Minerals (MULTIVITAMIN WITH MINERALS) tablet Take 1 tablet by mouth daily.   pravastatin (PRAVACHOL) 20 MG tablet Take 20 mg by mouth daily.   tamsulosin (FLOMAX) 0.4 MG CAPS capsule Take 0.4 mg by mouth daily.   guaiFENesin (MUCINEX) 600 MG 12 hr tablet Take 600 mg by mouth 2 (two) times daily as needed for cough or to loosen phlegm. (Patient not taking: Reported on 10/21/2022)   promethazine-codeine (PHENERGAN WITH CODEINE) 6.25-10 MG/5ML syrup Take 5 mLs by mouth every 6 (six) hours as needed for cough. (Patient not taking: Reported on 10/21/2022)   Rivaroxaban 15 & 20 MG TBPK Follow package directions: Take one 42m tablet by mouth twice a day. On day 22, switch to one 267mtablet once a day. Take with food. (Patient not taking: Reported on 10/21/2022)   No current facility-administered medications for this visit. (Other)   REVIEW OF SYSTEMS: ROS   Positive for: Endocrine, Cardiovascular, Eyes Negative for:  Constitutional, Gastrointestinal, Neurological, Skin, Genitourinary, Musculoskeletal, HENT, Respiratory, Psychiatric, Allergic/Imm, Heme/Lymph Last edited by Kingsley Spittle, COT on 10/21/2022  8:10 AM.     ALLERGIES Allergies  Allergen Reactions   Other Rash    Magic mouth wash    PAST MEDICAL HISTORY Past Medical History:  Diagnosis Date   Allergic rhinitis    Angio-edema    Chronic bronchitis (HCC)    Erectile dysfunction    Essential hypertension    Hyperlipidemia    Obesity    Type 2 diabetes mellitus ([place])    Past Surgical History:  Procedure Laterality Date   ADENOIDECTOMY     CATARACT EXTRACTION Bilateral    Oct OD, Nov OS 2023 Dr. Lucianne Lei    TONSILLECTOMY     FAMILY HISTORY Family History  Problem Relation Age of Onset   Lupus Sister    SOCIAL HISTORY Social History   Tobacco Use   Smoking status: Never   Smokeless tobacco: Never  Vaping Use   Vaping Use: Never used  Substance Use Topics   Alcohol use: Not Currently   Drug use: Never       OPHTHALMIC EXAM:  Base Eye Exam     Visual Acuity (Snellen - Linear)       Right Left   Dist [place] 20/40 20/80 +2   Dist ph [place] 20/30 +2 20/50 +2         Tonometry (Tonopen, 8:27 AM)       Right Left   Pressure 9 7         Pupils       Dark Light Shape React APD   Right 4 4 Irregular NR None   Left 2 1 Round Minimal None         Visual Fields (Counting fingers)       Left Right    Full Full         Extraocular Movement       Right Left    Full, Ortho Full, Ortho         Neuro/Psych     Oriented x3: Yes   Mood/Affect: Normal         Dilation     Both eyes: 1.0% Mydriacyl, 2.5% Phenylephrine @ 8:28 AM           Slit Lamp and Fundus Exam     Slit Lamp Exam       Right Left   Lids/Lashes Dermatochalasis - upper lid, Meibomian gland dysfunction Dermatochalasis - upper lid, Meibomian gland dysfunction   Conjunctiva/Sclera White and quiet White and quiet   Cornea Mild tear film debris, mild arcus, focal corneal haze at 0730, well healed cataract wound arcus, well healed cataract wound   Anterior Chamber deep and clear deep and clear   Iris Round and dilated, scattered transillumination defects greatest IT quad, focal atrophy at 0700 Round and dilated   Lens PC IOL in good position PC IOL in good position   Anterior Vitreous mild syneresis mild syneresis         Fundus Exam       Right Left   Disc Pink and Sharp Pink and Sharp, temporal PPA   C/D Ratio 0.6 0.6   Macula Flat, Blunted foveal reflex, trace cystic changes Flat, Blunted foveal reflex, central cystic changes, no heme   Vessels mild tortuosity mild tortuosity    Periphery Attached, reticular degeneration Attached, focal CR atrophy at 1030,  Refraction     Manifest Refraction       Sphere Cylinder Axis Dist VA   Right -1.75 +0.75 105 20/30   Left -1.50 +1.00 045 20/40+2           IMAGING AND PROCEDURES  Imaging and Procedures for 10/21/2022  OCT, Retina - OU - Both Eyes       Right Eye Quality was good. Central Foveal Thickness: 320. Progression has no prior data. Findings include normal foveal contour, no SRF, intraretinal fluid (Focal cystic changes temporal fovea and mac, partial PVD).   Left Eye Quality was good. Central Foveal Thickness: 466. Progression has no prior data. Findings include abnormal foveal contour, intraretinal fluid, subretinal fluid (Central edema with IRF and focal SRF, partial PVD).   Notes *Images captured and stored on drive  Diagnosis / Impression:  CME OU (OS >> OD) OD: Focal cystic changes temporal fovea and mac, partial PVD OS: Central edema with IRF and focal SRF, partial PVD  Clinical management:  See below  Abbreviations: NFP - Normal foveal profile. CME - cystoid macular edema. PED - pigment epithelial detachment. IRF - intraretinal fluid. SRF - subretinal fluid. EZ - ellipsoid zone. ERM - epiretinal membrane. ORA - outer retinal atrophy. ORT - outer retinal tubulation. SRHM - subretinal hyper-reflective material. IRHM - intraretinal hyper-reflective material      Fluorescein Angiography Optos (Transit OS)       Right Eye Progression has no prior data. Early phase findings include staining. Mid/Late phase findings include leakage, staining (Focal perifoveal petaloid hyper fluorescent leakage -- greatest temporal to fovea, mild staining of disc, mild peripheral staining).   Left Eye Progression has no prior data. Early phase findings include delayed filling, staining. Mid/Late phase findings include leakage, staining (Perifoveal petaloid hyper fluorescent leakage, mild staining  of disc, mild peripheral staining).   Notes **Images stored on drive**  Impression: CME OU (OS>OD), [place]      Injection into Tenon's Capsule - OS - Left Eye       Time Out 10/21/2022. 9:35 AM. Confirmed correct patient, procedure, site, and patient consented.   Anesthesia Topical anesthesia was used. Anesthetic medications included Lidocaine 2%, Proparacaine 0.5%.   Procedure Preparation included 5% betadine to ocular surface, eyelid speculum. A (25g) needle was used.   Injection: 4 mg triamcinolone acetonide 40 MG/ML   Route: Other, Site: Left Eye   Hoffman: [id number], Lot: [id number], Expiration date: 12/21/2023   Post-op Post injection exam found visual acuity of at least counting fingers. The patient tolerated the procedure well. There were no complications. The patient received written and verbal post procedure care education. Post injection medications were not given.   Notes 0.8 cc of Kenalog-40 (32 mg) injected into subtenon's capsule in the superotemporal quadrant. Betadine was applied to Injection area pre and post-injection then rinsed with sterile BSS. 1 drop of polymixin was instilled into the eye. There were no complications. Pt tolerated procedure well.           ASSESSMENT/PLAN:    ICD-10-CM   1. Cystoid macular edema of both eyes  H35.353 OCT, Retina - OU - Both Eyes    Injection into Tenon's Capsule - OS - Left Eye    triamcinolone acetonide (KENALOG-40) injection 4 mg    2. 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 Essential hypertension  I10     4. Hypertensive retinopathy of both eyes  H35.033 Fluorescein Angiography Optos (Transit OS)  5. Pseudophakia, both eyes  Z96.1      CME OU - s/p CEIOL OU, Dr. Lucianne Lei (OD 09.20.23, OS 11.29.23)  - diagnosed and started PF and ketorolac QID OU on 01.12.24  - no significant improvement on 01.26.24 f/u with Dr. Lucianne Lei and referred here for further evaluation and management  - BCVA  20/30 OD, 20/50 OS  - OCT shows +cystic changes OU (OS >> OD)  - FA shows perifoveal petaloid leakage and hyperfluorescence of disc OU -- consistent with CME / [place]  - recommend STK OS #1 today, 01.30.24  - pt wishes to proceed with injection  - RBA of procedure discussed, questions answered - informed consent obtained and signed - see procedure note - continue PF and Ketorolac QID OU -- pt given Prolensa samples to use QID OU in place of ketorolac for now - f/u 2-3 weeks, DFE, OCT, possible STK OD?  2. Diabetes mellitus, type 2 without retinopathy  - A1c in the 4-5 range, not on medication - The incidence, risk factors for progression, natural history and treatment options for diabetic retinopathy  were discussed with patient.   - The need for close monitoring of blood glucose, blood pressure, and serum lipids, avoiding cigarette or any type of tobacco, and the need for long term follow up was also discussed with patient. - f/u in 1 year, sooner prn  3,4. Hypertensive retinopathy OU - discussed importance of tight BP control - monitor  5. Pseudophakia OU  - s/p CE/IOL OU, Dr. Lucianne Lei (OD 09.20.23, OS 11.29.23)  - IOLs in good position  - post op CME as above   Ophthalmic Meds Ordered this visit:  Meds ordered this encounter  Medications   triamcinolone acetonide (KENALOG-40) injection 4 mg     Return for 2-3 wks - CME OU -- Dilated Exam, OCT.  There are no Patient Instructions on file for this visit.   Explained the diagnoses, plan, and follow up with the patient and they expressed understanding.  Patient expressed understanding of the importance of proper follow up care.   This document serves as a record of services personally performed by Gardiner Sleeper, MD, PhD. It was created on their behalf by San Jetty. Owens Shark, OA an ophthalmic technician. The creation of this record is the provider's dictation and/or activities during the visit.    Electronically signed by: San Jetty. Owens Shark, [place] 01.26.2024 5:25 PM  Gardiner Sleeper, M.D., Ph.D. Diseases & Surgery of the Retina and Vitreous [facility]  I have reviewed the above documentation for accuracy and completeness, and I agree with the above. Gardiner Sleeper, M.D., Ph.D. 10/21/22 5:35 PM  Abbreviations: M myopia (nearsighted); A astigmatism; H hyperopia (farsighted); P presbyopia; Mrx spectacle prescription;  CTL contact lenses; OD right eye; OS left eye; OU both eyes  XT exotropia; ET esotropia; PEK punctate epithelial keratitis; PEE punctate epithelial erosions; DES dry eye syndrome; MGD meibomian gland dysfunction; ATs artificial tears; PFAT's preservative free artificial tears; [place] nuclear sclerotic cataract; PSC posterior subcapsular cataract; ERM epi-retinal membrane; PVD posterior vitreous detachment; RD retinal detachment; DM diabetes mellitus; DR diabetic retinopathy; NPDR non-proliferative diabetic retinopathy; PDR proliferative diabetic retinopathy; CSME clinically significant macular edema; DME diabetic macular edema; dbh dot blot hemorrhages; CWS cotton wool spot; POAG primary open angle glaucoma; C/D cup-to-disc ratio; HVF humphrey visual field; GVF goldmann visual field; OCT optical coherence tomography; IOP intraocular pressure; BRVO Branch retinal vein occlusion; CRVO central retinal vein occlusion; CRAO central  retinal artery occlusion; BRAO branch retinal artery occlusion; RT retinal tear; SB scleral buckle; PPV pars plana vitrectomy; VH Vitreous hemorrhage; PRP panretinal laser photocoagulation; IVK intravitreal kenalog; VMT vitreomacular traction; MH Macular hole;  NVD neovascularization of the disc; NVE neovascularization elsewhere; AREDS age related eye disease study; ARMD age related macular degeneration; POAG primary open angle glaucoma; EBMD epithelial/anterior basement membrane dystrophy; ACIOL anterior chamber intraocular lens; IOL intraocular lens; PCIOL posterior chamber  intraocular lens; Phaco/IOL phacoemulsification with intraocular lens placement; [place] photorefractive keratectomy; LASIK laser assisted in situ keratomileusis; HTN hypertension; DM diabetes mellitus; COPD chronic obstructive pulmonary disease

## 2022-10-21 ENCOUNTER — Ambulatory Visit (INDEPENDENT_AMBULATORY_CARE_PROVIDER_SITE_OTHER): Payer: Medicare Other | Admitting: Ophthalmology

## 2022-10-21 ENCOUNTER — Encounter (INDEPENDENT_AMBULATORY_CARE_PROVIDER_SITE_OTHER): Payer: Self-pay | Admitting: Ophthalmology

## 2022-10-21 DIAGNOSIS — E119 Type 2 diabetes mellitus without complications: Secondary | ICD-10-CM

## 2022-10-21 DIAGNOSIS — I10 Essential (primary) hypertension: Secondary | ICD-10-CM | POA: Diagnosis not present

## 2022-10-21 DIAGNOSIS — H35353 Cystoid macular degeneration, bilateral: Secondary | ICD-10-CM

## 2022-10-21 DIAGNOSIS — Z961 Presence of intraocular lens: Secondary | ICD-10-CM | POA: Diagnosis not present

## 2022-10-21 DIAGNOSIS — H35033 Hypertensive retinopathy, bilateral: Secondary | ICD-10-CM

## 2022-10-21 DIAGNOSIS — H3581 Retinal edema: Secondary | ICD-10-CM

## 2022-10-21 MED ORDER — TRIAMCINOLONE ACETONIDE 40 MG/ML IJ SUSP FOR KALEIDOSCOPE
4.0000 mg | INTRAMUSCULAR | Status: AC | PRN
  Administered 2022-10-21: 4 mg

## 2022-11-04 ENCOUNTER — Encounter (INDEPENDENT_AMBULATORY_CARE_PROVIDER_SITE_OTHER): Payer: Self-pay | Admitting: Ophthalmology

## 2022-11-04 ENCOUNTER — Ambulatory Visit (INDEPENDENT_AMBULATORY_CARE_PROVIDER_SITE_OTHER): Payer: Medicare Other | Admitting: Ophthalmology

## 2022-11-04 DIAGNOSIS — H35353 Cystoid macular degeneration, bilateral: Secondary | ICD-10-CM

## 2022-11-04 DIAGNOSIS — Z961 Presence of intraocular lens: Secondary | ICD-10-CM | POA: Diagnosis not present

## 2022-11-04 DIAGNOSIS — H35033 Hypertensive retinopathy, bilateral: Secondary | ICD-10-CM

## 2022-11-04 DIAGNOSIS — I10 Essential (primary) hypertension: Secondary | ICD-10-CM | POA: Diagnosis not present

## 2022-11-04 DIAGNOSIS — E119 Type 2 diabetes mellitus without complications: Secondary | ICD-10-CM

## 2022-11-04 MED ORDER — PREDNISOLONE ACETATE 1 % OP SUSP: 1.0000 [drp] | Freq: Four times a day (QID) | OPHTHALMIC | 3 refills | Status: DC

## 2022-11-04 MED ORDER — TRIAMCINOLONE ACETONIDE 40 MG/ML IJ SUSP FOR KALEIDOSCOPE
40.0000 mg | INTRAMUSCULAR | Status: AC | PRN
  Administered 2022-11-04: 40 mg

## 2022-11-04 MED ORDER — KETOROLAC TROMETHAMINE 0.5 % OP SOLN: 1.0000 [drp] | Freq: Four times a day (QID) | OPHTHALMIC | 3 refills | Status: DC

## 2022-11-04 NOTE — Progress Notes (Signed)
[facility] Note  11/04/2022     CHIEF COMPLAINT Patient presents for Retina Follow Up   HISTORY OF PRESENT ILLNESS: Walter Baxter is a 82 y.o. male who presents to the clinic today for:   HPI     Retina Follow Up   Patient presents with  Other.  In both eyes.  Severity is moderate.  Duration of 2 weeks.  Since onset it is gradually improving.  I, the attending physician,  performed the HPI with the patient and updated documentation appropriately.        Comments   Pt here for 2 wk ret f/u for CME OU. Pt states VA has improved until he puts in drops then everything is blurry again. Reports reading labels he hasn't been able to read previously.       Last edited by Bernarda Caffey, MD on 11/06/2022  1:51 AM.    Pt states vision has improved   Referring physician: Mayra Neer, MD [place],  [place] [id number]  HISTORICAL INFORMATION:   Selected notes from the MEDICAL RECORD NUMBER Referred by Dr. Lucianne Lei for CME OU LEE:  Ocular Hx-  s/p CEIOL OD 09.20.23 S/p CEIOL OS 11.29.23 Currently on PF and Ketorolac QID OU -- started 01.12.24 PMH-    CURRENT MEDICATIONS: Current Outpatient Medications (Ophthalmic Drugs)  Medication Sig   ketorolac (ACULAR) 0.5 % ophthalmic solution Place 1 drop into both eyes 4 (four) times daily.   prednisoLONE acetate (PRED FORTE) 1 % ophthalmic suspension Place 1 drop into both eyes 4 (four) times daily.   No current facility-administered medications for this visit. (Ophthalmic Drugs)   Current Outpatient Medications (Other)  Medication Sig   acetaminophen (TYLENOL) 500 MG tablet Take 1,000 mg by mouth every 6 (six) hours as needed for mild pain or headache.   aspirin EC 81 MG tablet Take 81 mg by mouth daily.   clonazePAM (KLONOPIN) 0.5 MG tablet Take 0.5 mg by mouth daily as needed.   famotidine (PEPCID) 20 MG tablet Take 20 mg by mouth 2 (two) times daily.   guaiFENesin  (MUCINEX) 600 MG 12 hr tablet Take 600 mg by mouth 2 (two) times daily as needed for cough or to loosen phlegm. (Patient not taking: Reported on 10/21/2022)   levothyroxine (SYNTHROID) 25 MCG tablet Take 25 mcg by mouth every morning.   metoprolol tartrate (LOPRESSOR) 50 MG tablet Take 50 mg by mouth 2 (two) times daily.   montelukast (SINGULAIR) 10 MG tablet Take 10 mg by mouth at bedtime. (Patient not taking: Reported on 11/04/2022)   Multiple Vitamins-Minerals (MULTIVITAMIN WITH MINERALS) tablet Take 1 tablet by mouth daily.   pravastatin (PRAVACHOL) 20 MG tablet Take 20 mg by mouth daily.   promethazine-codeine (PHENERGAN WITH CODEINE) 6.25-10 MG/5ML syrup Take 5 mLs by mouth every 6 (six) hours as needed for cough. (Patient not taking: Reported on 10/21/2022)   Rivaroxaban 15 & 20 MG TBPK Follow package directions: Take one 15mg  tablet by mouth twice a day. On day 22, switch to one 20mg  tablet once a day. Take with food. (Patient not taking: Reported on 10/21/2022)   tamsulosin (FLOMAX) 0.4 MG CAPS capsule Take 0.4 mg by mouth daily.   No current facility-administered medications for this visit. (Other)   REVIEW OF SYSTEMS: ROS   Positive for: Endocrine, Cardiovascular, Eyes Negative for: Constitutional, Gastrointestinal, Neurological, Skin, Genitourinary, Musculoskeletal, HENT, Respiratory, Psychiatric, Allergic/Imm, Heme/Lymph Last edited by Kingsley Spittle,  COT on 11/04/2022  1:21 PM.      ALLERGIES Allergies  Allergen Reactions   Other Rash    Magic mouth wash    PAST MEDICAL HISTORY Past Medical History:  Diagnosis Date   Allergic rhinitis    Angio-edema    Chronic bronchitis (HCC)    Erectile dysfunction    Essential hypertension    Hyperlipidemia    Obesity    Type 2 diabetes mellitus ([place])    Past Surgical History:  Procedure Laterality Date   ADENOIDECTOMY     CATARACT EXTRACTION Bilateral    Oct OD, Nov OS 2023 Dr. Lucianne Lei   TONSILLECTOMY     FAMILY  HISTORY Family History  Problem Relation Age of Onset   Lupus Sister    SOCIAL HISTORY Social History   Tobacco Use   Smoking status: Never   Smokeless tobacco: Never  Vaping Use   Vaping Use: Never used  Substance Use Topics   Alcohol use: Not Currently   Drug use: Never       OPHTHALMIC EXAM:  Base Eye Exam     Visual Acuity (Snellen - Linear)       Right Left   Dist [place] 20/40 +2 20/40 -2   Dist ph [place] 20/25 +1 20/30 -1         Tonometry (Tonopen, 1:28 PM)       Right Left   Pressure 10 9         Pupils       Dark Light Shape React APD   Right 4 4 Irregular NR None   Left 2 1 Round Minimal None         Visual Fields       Left Right    Full          Neuro/Psych     Oriented x3: Yes   Mood/Affect: Normal         Dilation     Both eyes: 1.0% Mydriacyl, 2.5% Phenylephrine @ 1:29 PM           Slit Lamp and Fundus Exam     Slit Lamp Exam       Right Left   Lids/Lashes Dermatochalasis - upper lid, Meibomian gland dysfunction Dermatochalasis - upper lid, Meibomian gland dysfunction   Conjunctiva/Sclera White and quiet White and quiet   Cornea Mild tear film debris, mild arcus, focal corneal haze at 0730, well healed cataract wound arcus, well healed cataract wound, tear film debris   Anterior Chamber deep and clear deep and clear   Iris Round and dilated, scattered transillumination defects greatest IT quad, focal atrophy at 0700 Round and dilated   Lens PC IOL in good position PC IOL in good position   Anterior Vitreous mild syneresis mild syneresis         Fundus Exam       Right Left   Disc Pink and Sharp Pink and Sharp, temporal PPA   C/D Ratio 0.6 0.6   Macula Flat, Blunted foveal reflex, trace cystic changes, mild ERM Flat, Blunted foveal reflex, central cystic changes -- improving, prominent cyst nasal fovea, no heme   Vessels mild attenuation, mild tortuosity, mild AV crossing changes mild tortuosity   Periphery  Attached, reticular degeneration Attached, focal CR atrophy at 1030,            IMAGING AND PROCEDURES  Imaging and Procedures for 11/04/2022  OCT, Retina - OU - Both Eyes  Right Eye Quality was good. Central Foveal Thickness: 310. Progression has improved. Findings include normal foveal contour, no SRF, intraretinal fluid (Mild interval improvement in IRF / cystic changes temporal fovea and mac, partial PVD).   Left Eye Quality was good. Central Foveal Thickness: 329. Progression has improved. Findings include abnormal foveal contour, intraretinal fluid, subretinal fluid (Interval improvement in IRF, focal SRF and foveal contour, partial PVD).   Notes *Images captured and stored on drive  Diagnosis / Impression:  CME OU (OS >> OD) OD: Mild interval improvement in IRF / cystic changes temporal fovea and mac, partial PVD OS: Interval improvement in IRF, focal SRF and foveal contour, partial PVD  Clinical management:  See below  Abbreviations: NFP - Normal foveal profile. CME - cystoid macular edema. PED - pigment epithelial detachment. IRF - intraretinal fluid. SRF - subretinal fluid. EZ - ellipsoid zone. ERM - epiretinal membrane. ORA - outer retinal atrophy. ORT - outer retinal tubulation. SRHM - subretinal hyper-reflective material. IRHM - intraretinal hyper-reflective material      Injection into Tenon's Capsule - OD - Right Eye       Time Out 11/04/2022. 2:11 PM. Confirmed correct patient, procedure, site, and patient consented.   Anesthesia Topical anesthesia was used. Anesthetic medications included Lidocaine 2%, Proparacaine 0.5%.   Procedure Preparation included 5% betadine to ocular surface, eyelid speculum. A (25g) needle was used.   Injection: 40 mg triamcinolone acetonide 40 MG/ML   Route: Other, Site: Right Eye   [place]: [phone], Lot: [id number], Expiration date: 10/22/2023   Post-op Post injection exam found visual acuity of at least counting  fingers. The patient tolerated the procedure well. There were no complications. The patient received written and verbal post procedure care education. Post injection medications were not given.   Notes 0.9 cc of Kenalog-40 (36 mg) injected into subtenon's capsule in the superotemporal quadrant. Betadine was applied to Injection area pre and post-injection then rinsed with sterile BSS. 1 drop of ofloxacin was instilled into the eye. There were no complications. Pt tolerated procedure well.            ASSESSMENT/PLAN:    ICD-10-CM   1. Cystoid macular edema of both eyes  H35.353 OCT, Retina - OU - Both Eyes    Injection into Tenon's Capsule - OD - Right Eye    triamcinolone acetonide (KENALOG-40) injection 40 mg    2. 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 Essential hypertension  I10     4. Hypertensive retinopathy of both eyes  H35.033     5. Pseudophakia, both eyes  Z96.1      CME OU - s/p CEIOL OU, Dr. Lucianne Lei (OD 09.20.23, OS 11.29.23) - s/p STK OS #1 (01.30.24)  - diagnosed and started PF and ketorolac QID OU on 01.12.24  - no significant improvement on 01.26.24 f/u with Dr. Lucianne Lei and referred here for further evaluation and management  - BCVA 20/25 OD, 20/30 OS -- both improved  - OCT shows OD: Mild interval improvement in IRF / cystic changes temporal fovea and mac, partial PVD; OS: Interval improvement in IRF, focal SRF and foveal contour, partial PVD  - FA shows perifoveal petaloid leakage and hyperfluorescence of disc OU -- consistent with CME / [place] - continue PF and Ketorolac QID OU -- pt given Prolensa samples to use QID OU in place of ketorolac for now - recommend STK OD #1 for CME - pt wishes to proceed with injection -  RBA of procedure discussed, questions answered - informed consent obtained and signed - see procedure note - f/u 4-6 weeks, DFE, OCT  2. Diabetes mellitus, type 2 without retinopathy  - A1c in the 4-5 range, not on  medication - The incidence, risk factors for progression, natural history and treatment options for diabetic retinopathy  were discussed with patient.   - The need for close monitoring of blood glucose, blood pressure, and serum lipids, avoiding cigarette or any type of tobacco, and the need for long term follow up was also discussed with patient. - f/u in 1 year, sooner prn  3,4. Hypertensive retinopathy OU - discussed importance of tight BP control - monitor  5. Pseudophakia OU  - s/p CE/IOL OU, Dr. Zenaida Niece (OD 09.20.23, OS 11.29.23)  - IOLs in good position  - post op CME as above   Ophthalmic Meds Ordered this visit:  Meds ordered this encounter  Medications   prednisoLONE acetate (PRED FORTE) 1 % ophthalmic suspension    Sig: Place 1 drop into both eyes 4 (four) times daily.    Dispense:  10 mL    Refill:  3   ketorolac (ACULAR) 0.5 % ophthalmic solution    Sig: Place 1 drop into both eyes 4 (four) times daily.    Dispense:  10 mL    Refill:  3   triamcinolone acetonide (KENALOG-40) injection 40 mg     Return for f/u 4-6 weeks, CME OU, DFE, OCT.  There are no Patient Instructions on file for this visit.   Explained the diagnoses, plan, and follow up with the patient and they expressed understanding.  Patient expressed understanding of the importance of proper follow up care.   This document serves as a record of services personally performed by Karie Chimera, MD, PhD. It was created on their behalf by Glee Arvin. Manson Passey, OA an ophthalmic technician. The creation of this record is the provider's dictation and/or activities during the visit.    Electronically signed by: Glee Arvin. Manson Passey, [place] 02.13.2024 1:51 AM   Karie Chimera, M.D., Ph.D. Diseases & Surgery of the Retina and Vitreous [facility]  I have reviewed the above documentation for accuracy and completeness, and I agree with the above. Karie Chimera, M.D., Ph.D. 11/06/22 1:53  AM   Abbreviations: M myopia (nearsighted); A astigmatism; H hyperopia (farsighted); P presbyopia; Mrx spectacle prescription;  CTL contact lenses; OD right eye; OS left eye; OU both eyes  XT exotropia; ET esotropia; PEK punctate epithelial keratitis; PEE punctate epithelial erosions; DES dry eye syndrome; MGD meibomian gland dysfunction; ATs artificial tears; PFAT's preservative free artificial tears; NSC nuclear sclerotic cataract; PSC posterior subcapsular cataract; ERM epi-retinal membrane; PVD posterior vitreous detachment; RD retinal detachment; DM diabetes mellitus; DR diabetic retinopathy; NPDR non-proliferative diabetic retinopathy; PDR proliferative diabetic retinopathy; CSME clinically significant macular edema; DME diabetic macular edema; dbh dot blot hemorrhages; CWS cotton wool spot; POAG primary open angle glaucoma; C/D cup-to-disc ratio; HVF humphrey visual field; GVF goldmann visual field; OCT optical coherence tomography; IOP intraocular pressure; BRVO Branch retinal vein occlusion; CRVO central retinal vein occlusion; CRAO central retinal artery occlusion; BRAO branch retinal artery occlusion; RT retinal tear; SB scleral buckle; PPV pars plana vitrectomy; VH Vitreous hemorrhage; PRP panretinal laser photocoagulation; IVK intravitreal kenalog; VMT vitreomacular traction; MH Macular hole;  NVD neovascularization of the disc; NVE neovascularization elsewhere; AREDS age related eye disease study; ARMD age related macular degeneration; POAG primary open angle glaucoma; EBMD  epithelial/anterior basement membrane dystrophy; ACIOL anterior chamber intraocular lens; IOL intraocular lens; PCIOL posterior chamber intraocular lens; Phaco/IOL phacoemulsification with intraocular lens placement; [place] photorefractive keratectomy; LASIK laser assisted in situ keratomileusis; HTN hypertension; DM diabetes mellitus; COPD chronic obstructive pulmonary disease

## 2022-11-25 NOTE — Progress Notes (Signed)
[facility] Note  12/09/2022     CHIEF COMPLAINT Patient presents for Retina Follow Up   HISTORY OF PRESENT ILLNESS: Walter Baxter is a 82 y.o. male who presents to the clinic today for:   HPI     Retina Follow Up   Patient presents with  Other.  In both eyes.  Severity is moderate.  Duration of 5 weeks.  Since onset it is stable.  I, the attending physician,  performed the HPI with the patient and updated documentation appropriately.        Comments   Pt here for 5 wk ret f/u CME OU. Pt states VA is about the same. Vision is blurry after using gtts. Sometimes distance is hard to focus. Pt ran out of gtts on Sunday. Has not used any since. Wanted to wait to be seen.       Last edited by Bernarda Caffey, MD on 12/11/2022  9:41 PM.     Pt states vision has improved   Referring physician: Mayra Neer, MD [place],  [place] [address]  HISTORICAL INFORMATION:   Selected notes from the MEDICAL RECORD NUMBER Referred by Dr. Lucianne Lei for CME OU LEE:  Ocular Hx-  s/p CEIOL OD 09.20.23 S/p CEIOL OS 11.29.23 Currently on PF and Ketorolac QID OU -- started 01.12.24 PMH-    CURRENT MEDICATIONS: Current Outpatient Medications (Ophthalmic Drugs)  Medication Sig   ketorolac (ACULAR) 0.5 % ophthalmic solution Place 1 drop into both eyes 2 (two) times daily.   prednisoLONE acetate (PRED FORTE) 1 % ophthalmic suspension Place 1 drop into both eyes 2 (two) times daily.   No current facility-administered medications for this visit. (Ophthalmic Drugs)   Current Outpatient Medications (Other)  Medication Sig   acetaminophen (TYLENOL) 500 MG tablet Take 1,000 mg by mouth every 6 (six) hours as needed for mild pain or headache.   aspirin EC 81 MG tablet Take 81 mg by mouth daily.   clonazePAM (KLONOPIN) 0.5 MG tablet Take 0.5 mg by mouth daily as needed.   famotidine (PEPCID) 20 MG tablet Take 20 mg by mouth 2 (two) times daily.    levothyroxine (SYNTHROID) 25 MCG tablet Take 25 mcg by mouth every morning.   metoprolol tartrate (LOPRESSOR) 50 MG tablet Take 50 mg by mouth 2 (two) times daily.   Multiple Vitamins-Minerals (MULTIVITAMIN WITH MINERALS) tablet Take 1 tablet by mouth daily.   pravastatin (PRAVACHOL) 20 MG tablet Take 20 mg by mouth daily.   tamsulosin (FLOMAX) 0.4 MG CAPS capsule Take 0.4 mg by mouth daily.   guaiFENesin (MUCINEX) 600 MG 12 hr tablet Take 600 mg by mouth 2 (two) times daily as needed for cough or to loosen phlegm. (Patient not taking: Reported on 10/21/2022)   montelukast (SINGULAIR) 10 MG tablet Take 10 mg by mouth at bedtime. (Patient not taking: Reported on 11/04/2022)   promethazine-codeine (PHENERGAN WITH CODEINE) 6.25-10 MG/5ML syrup Take 5 mLs by mouth every 6 (six) hours as needed for cough. (Patient not taking: Reported on 10/21/2022)   Rivaroxaban 15 & 20 MG TBPK Follow package directions: Take one 15mg  tablet by mouth twice a day. On day 22, switch to one 20mg  tablet once a day. Take with food. (Patient not taking: Reported on 10/21/2022)   No current facility-administered medications for this visit. (Other)   REVIEW OF SYSTEMS: ROS   Positive for: Endocrine, Cardiovascular, Eyes Negative for: Constitutional, Gastrointestinal, Neurological, Skin, Genitourinary, Musculoskeletal,  HENT, Respiratory, Psychiatric, Allergic/Imm, Heme/Lymph Last edited by Kingsley Spittle, COT on 12/09/2022  1:47 PM.       ALLERGIES Allergies  Allergen Reactions   Other Rash    Magic mouth wash    PAST MEDICAL HISTORY Past Medical History:  Diagnosis Date   Allergic rhinitis    Angio-edema    Chronic bronchitis (HCC)    Erectile dysfunction    Essential hypertension    Hyperlipidemia    Obesity    Type 2 diabetes mellitus ([place])    Past Surgical History:  Procedure Laterality Date   ADENOIDECTOMY     CATARACT EXTRACTION Bilateral    Oct OD, Nov OS 2023 Dr. Lucianne Lei   TONSILLECTOMY      FAMILY HISTORY Family History  Problem Relation Age of Onset   Lupus Sister    SOCIAL HISTORY Social History   Tobacco Use   Smoking status: Never   Smokeless tobacco: Never  Vaping Use   Vaping Use: Never used  Substance Use Topics   Alcohol use: Not Currently   Drug use: Never       OPHTHALMIC EXAM:  Base Eye Exam     Visual Acuity (Snellen - Linear)       Right Left   Dist [place] 20/50 -2 20/30   Dist ph [place] 20/25 +2 20/25         Tonometry (Tonopen, 1:56 PM)       Right Left   Pressure 12 10         Pupils       Pupils Dark Light Shape React APD   Right PERRL 4 4 Irregular NR None   Left PERRL 3 2 Round Minimal None         Visual Fields (Counting fingers)       Left Right    Full Full         Extraocular Movement       Right Left    Full, Ortho Full, Ortho         Neuro/Psych     Oriented x3: Yes   Mood/Affect: Normal         Dilation     Both eyes: 1.0% Mydriacyl, 2.5% Phenylephrine @ 1:56 PM           Slit Lamp and Fundus Exam     Slit Lamp Exam       Right Left   Lids/Lashes Dermatochalasis - upper lid, Meibomian gland dysfunction Dermatochalasis - upper lid, Meibomian gland dysfunction   Conjunctiva/Sclera White and quiet White and quiet   Cornea Mild tear film debris, mild arcus, focal corneal haze at 0730, well healed cataract wound arcus, well healed cataract wound, trace tear film debris   Anterior Chamber deep and clear deep and clear   Iris Round and dilated, scattered transillumination defects greatest IT quad, focal atrophy at 0700 Round and moderately dilated   Lens PC IOL in good position PC IOL in good position   Anterior Vitreous mild syneresis mild syneresis         Fundus Exam       Right Left   Disc Pink and Sharp Pink and Sharp, temporal PPA   C/D Ratio 0.6 0.5   Macula Flat, Blunted foveal reflex, trace cystic changes -- improved, mild ERM Flat, Blunted foveal reflex, central cystic changes  -- improved, no heme   Vessels attenuated, mild tortuosity attenuated, mild tortuosity   Periphery Attached, reticular degeneration Attached, focal CR  atrophy at 1030, , reticular degeneration           IMAGING AND PROCEDURES  Imaging and Procedures for 12/09/2022  OCT, Retina - OU - Both Eyes       Right Eye Quality was good. Central Foveal Thickness: 280. Progression has improved. Findings include normal foveal contour, no IRF, no SRF (interval improvement in IRF / cystic changes temporal fovea and mac, partial PVD).   Left Eye Quality was good. Central Foveal Thickness: 285. Progression has improved. Findings include normal foveal contour, no IRF, no SRF (Interval improvement in IRF and foveal contour -- just trace cystic changes remain, partial PVD).   Notes *Images captured and stored on drive  Diagnosis / Impression:  CME OU (OS >> OD) OD: interval improvement in IRF / cystic changes temporal fovea and mac, partial PVD OS: Interval improvement in IRF and foveal contour -- just trace cystic changes remain, partial PVD  Clinical management:  See below  Abbreviations: NFP - Normal foveal profile. CME - cystoid macular edema. PED - pigment epithelial detachment. IRF - intraretinal fluid. SRF - subretinal fluid. EZ - ellipsoid zone. ERM - epiretinal membrane. ORA - outer retinal atrophy. ORT - outer retinal tubulation. SRHM - subretinal hyper-reflective material. IRHM - intraretinal hyper-reflective material            ASSESSMENT/PLAN:    ICD-10-CM   1. Cystoid macular edema of both eyes  H35.353 OCT, Retina - OU - Both Eyes    2. 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 Essential hypertension  I10     4. Hypertensive retinopathy of both eyes  H35.033     5. Pseudophakia, both eyes  Z96.1      CME OU - s/p CEIOL OU, Dr. Lucianne Lei (OD 09.20.23, OS 11.29.23) - FA 01.30.24 shows perifoveal petaloid leakage and hyperfluorescence of disc OU --  consistent with CME / [place] - s/p STK OS #1 (01.30.24) - s/p STK OD #1 (02.13.24)  - diagnosed and started PF and ketorolac QID OU on 01.12.24  - no significant improvement on 01.26.24 f/u with Dr. Lucianne Lei and referred here for further evaluation and management  - BCVA 20/25 OU  **pt reports ran out of drops on Sunday -- was on PF/Ketorolac QID OU**  - OCT shows OD: interval improvement in IRF / cystic changes temporal fovea and mac, partial PVD; OS: Interval improvement in IRF and foveal contour -- just trace cystic changes remain, partial PVD - restart PF and Ketorolac to BID OU until next appt - STK informed consent obtained and signed, 01.30.24 (OU) - f/u 2-3 months, DFE, OCT  2. Diabetes mellitus, type 2 without retinopathy  - A1c in the 4-5 range, not on medication - The incidence, risk factors for progression, natural history and treatment options for diabetic retinopathy  were discussed with patient.   - The need for close monitoring of blood glucose, blood pressure, and serum lipids, avoiding cigarette or any type of tobacco, and the need for long term follow up was also discussed with patient. - f/u in 1 year, sooner prn  3,4. Hypertensive retinopathy OU - discussed importance of tight BP control - continue to monitor  5. Pseudophakia OU  - s/p CE/IOL OU, Dr. Lucianne Lei (OD 09.20.23, OS 11.29.23)  - IOLs in good position  - post op CME as above   Ophthalmic Meds Ordered this visit:  Meds ordered this encounter  Medications   ketorolac (ACULAR) 0.5 %  ophthalmic solution    Sig: Place 1 drop into both eyes 2 (two) times daily.    Dispense:  10 mL    Refill:  3   prednisoLONE acetate (PRED FORTE) 1 % ophthalmic suspension    Sig: Place 1 drop into both eyes 2 (two) times daily.    Dispense:  10 mL    Refill:  3     Return for f/u 2-3 months, CME OU, DFE, OCT.  There are no Patient Instructions on file for this visit.   Explained the diagnoses, plan, and follow up with  the patient and they expressed understanding.  Patient expressed understanding of the importance of proper follow up care.   This document serves as a record of services personally performed by Gardiner Sleeper, MD, PhD. It was created on their behalf by Renaldo Reel, [place] an ophthalmic technician. The creation of this record is the provider's dictation and/or activities during the visit.    Electronically signed by:  Renaldo Reel, COT  03.05.24 9:41 PM  This document serves as a record of services personally performed by Gardiner Sleeper, MD, PhD. It was created on their behalf by San Jetty. Owens Shark, OA an ophthalmic technician. The creation of this record is the provider's dictation and/or activities during the visit.    Electronically signed by: San Jetty. Owens Shark, OA @TODAY @ 9:41 PM   Gardiner Sleeper, M.D., Ph.D. Diseases & Surgery of the Retina and Vitreous [facility]  I have reviewed the above documentation for accuracy and completeness, and I agree with the above. Gardiner Sleeper, M.D., Ph.D. 12/11/22 9:47 PM  Abbreviations: M myopia (nearsighted); A astigmatism; H hyperopia (farsighted); P presbyopia; Mrx spectacle prescription;  CTL contact lenses; OD right eye; OS left eye; OU both eyes  XT exotropia; ET esotropia; PEK punctate epithelial keratitis; PEE punctate epithelial erosions; DES dry eye syndrome; MGD meibomian gland dysfunction; ATs artificial tears; PFAT's preservative free artificial tears; [place] nuclear sclerotic cataract; PSC posterior subcapsular cataract; ERM epi-retinal membrane; PVD posterior vitreous detachment; RD retinal detachment; DM diabetes mellitus; DR diabetic retinopathy; NPDR non-proliferative diabetic retinopathy; PDR proliferative diabetic retinopathy; CSME clinically significant macular edema; DME diabetic macular edema; dbh dot blot hemorrhages; CWS cotton wool spot; POAG primary open angle glaucoma; C/D cup-to-disc ratio; HVF  humphrey visual field; GVF goldmann visual field; OCT optical coherence tomography; IOP intraocular pressure; BRVO Branch retinal vein occlusion; CRVO central retinal vein occlusion; CRAO central retinal artery occlusion; BRAO branch retinal artery occlusion; RT retinal tear; SB scleral buckle; PPV pars plana vitrectomy; VH Vitreous hemorrhage; PRP panretinal laser photocoagulation; IVK intravitreal kenalog; VMT vitreomacular traction; MH Macular hole;  NVD neovascularization of the disc; NVE neovascularization elsewhere; AREDS age related eye disease study; ARMD age related macular degeneration; POAG primary open angle glaucoma; EBMD epithelial/anterior basement membrane dystrophy; ACIOL anterior chamber intraocular lens; IOL intraocular lens; PCIOL posterior chamber intraocular lens; Phaco/IOL phacoemulsification with intraocular lens placement; [place] photorefractive keratectomy; LASIK laser assisted in situ keratomileusis; HTN hypertension; DM diabetes mellitus; COPD chronic obstructive pulmonary disease

## 2022-12-09 ENCOUNTER — Ambulatory Visit (INDEPENDENT_AMBULATORY_CARE_PROVIDER_SITE_OTHER): Payer: Medicare Other | Admitting: Ophthalmology

## 2022-12-09 ENCOUNTER — Encounter (INDEPENDENT_AMBULATORY_CARE_PROVIDER_SITE_OTHER): Payer: Self-pay | Admitting: Ophthalmology

## 2022-12-09 DIAGNOSIS — I10 Essential (primary) hypertension: Secondary | ICD-10-CM | POA: Diagnosis not present

## 2022-12-09 DIAGNOSIS — H35033 Hypertensive retinopathy, bilateral: Secondary | ICD-10-CM

## 2022-12-09 DIAGNOSIS — Z961 Presence of intraocular lens: Secondary | ICD-10-CM

## 2022-12-09 DIAGNOSIS — E119 Type 2 diabetes mellitus without complications: Secondary | ICD-10-CM

## 2022-12-09 DIAGNOSIS — H35353 Cystoid macular degeneration, bilateral: Secondary | ICD-10-CM | POA: Diagnosis not present

## 2022-12-09 MED ORDER — KETOROLAC TROMETHAMINE 0.5 % OP SOLN: 1.0000 [drp] | Freq: Two times a day (BID) | OPHTHALMIC | 3 refills | Status: DC

## 2022-12-09 MED ORDER — PREDNISOLONE ACETATE 1 % OP SUSP: 1.0000 [drp] | Freq: Two times a day (BID) | OPHTHALMIC | 3 refills | Status: DC

## 2022-12-11 ENCOUNTER — Encounter (INDEPENDENT_AMBULATORY_CARE_PROVIDER_SITE_OTHER): Payer: Self-pay | Admitting: Ophthalmology

## 2022-12-31 DIAGNOSIS — H04123 Dry eye syndrome of bilateral lacrimal glands: Secondary | ICD-10-CM | POA: Diagnosis not present

## 2022-12-31 DIAGNOSIS — Z961 Presence of intraocular lens: Secondary | ICD-10-CM | POA: Diagnosis not present

## 2022-12-31 DIAGNOSIS — H59033 Cystoid macular edema following cataract surgery, bilateral: Secondary | ICD-10-CM | POA: Diagnosis not present

## 2023-02-10 NOTE — Progress Notes (Signed)
[facility] - Clinic Note  02/24/2023     CHIEF COMPLAINT Patient presents for Retina Follow Up   HISTORY OF PRESENT ILLNESS: Walter Baxter is a 82 y.o. male who presents to the clinic today for:   HPI     Retina Follow Up   Patient presents with  Other.  In both eyes.  Severity is moderate.  Duration of 3 months.  Since onset it is stable.  I, the attending physician,  performed the HPI with the patient and updated documentation appropriately.        Comments   Pt here for 3 mo ret f/u for CME OU. Pt states VA is about the same but stil struggles w/ distance. Has a hard time telling what is coming towards him. NVA is decent. Pt was taking gtts until he ran out last Wednesday. Per Dr. Zenaida Niece, pt was told he should stop but just went ahead and finished the bottles. Has not used them since last week.       Last edited by Rennis Chris, MD on 02/24/2023  8:41 PM.    Patient states that the distance vision sees things that are not there. He has no issues with near vision.    Referring physician: Diona Foley, MD [address] [place],  [place] [address]  HISTORICAL INFORMATION:   Selected notes from the MEDICAL RECORD NUMBER Referred by Dr. Zenaida Niece for CME OU LEE:  Ocular Hx-  s/p CEIOL OD 09.20.23 S/p CEIOL OS 11.29.23 Currently on PF and Ketorolac QID OU -- started 01.12.24 PMH-    CURRENT MEDICATIONS: Current Outpatient Medications (Ophthalmic Drugs)  Medication Sig   ketorolac (ACULAR) 0.5 % ophthalmic solution Place 1 drop into the left eye 3 (three) times daily.   prednisoLONE acetate (PRED FORTE) 1 % ophthalmic suspension Place 1 drop into the left eye 3 (three) times daily.   No current facility-administered medications for this visit. (Ophthalmic Drugs)   Current Outpatient Medications (Other)  Medication Sig   acetaminophen (TYLENOL) 500 MG tablet Take 1,000 mg by mouth every 6 (six) hours as needed for mild pain or headache.   aspirin  EC 81 MG tablet Take 81 mg by mouth daily.   clonazePAM (KLONOPIN) 0.5 MG tablet Take 0.5 mg by mouth daily as needed.   famotidine (PEPCID) 20 MG tablet Take 20 mg by mouth 2 (two) times daily.   levothyroxine (SYNTHROID) 25 MCG tablet Take 25 mcg by mouth every morning.   metoprolol tartrate (LOPRESSOR) 50 MG tablet Take 50 mg by mouth 2 (two) times daily.   Multiple Vitamins-Minerals (MULTIVITAMIN WITH MINERALS) tablet Take 1 tablet by mouth daily.   pravastatin (PRAVACHOL) 20 MG tablet Take 20 mg by mouth daily.   tamsulosin (FLOMAX) 0.4 MG CAPS capsule Take 0.4 mg by mouth daily.   guaiFENesin (MUCINEX) 600 MG 12 hr tablet Take 600 mg by mouth 2 (two) times daily as needed for cough or to loosen phlegm. (Patient not taking: Reported on 10/21/2022)   montelukast (SINGULAIR) 10 MG tablet Take 10 mg by mouth at bedtime. (Patient not taking: Reported on 11/04/2022)   promethazine-codeine (PHENERGAN WITH CODEINE) 6.25-10 MG/5ML syrup Take 5 mLs by mouth every 6 (six) hours as needed for cough. (Patient not taking: Reported on 10/21/2022)   Rivaroxaban 15 & 20 MG TBPK Follow package directions: Take one 15mg  tablet by mouth twice a day. On day 22, switch to one 20mg  tablet once a day. Take with food. (  Patient not taking: Reported on 10/21/2022)   No current facility-administered medications for this visit. (Other)   REVIEW OF SYSTEMS: ROS   Positive for: Endocrine, Cardiovascular, Eyes Negative for: Constitutional, Gastrointestinal, Neurological, Skin, Genitourinary, Musculoskeletal, HENT, Respiratory, Psychiatric, Allergic/Imm, Heme/Lymph Last edited by Thompson Grayer, COT on 02/24/2023  1:13 PM.     ALLERGIES Allergies  Allergen Reactions   Other Rash    Magic mouth wash    PAST MEDICAL HISTORY Past Medical History:  Diagnosis Date   Allergic rhinitis    Angio-edema    Chronic bronchitis (HCC)    Erectile dysfunction    Essential hypertension    Hyperlipidemia    Obesity    Type  2 diabetes mellitus (HCC)    Past Surgical History:  Procedure Laterality Date   ADENOIDECTOMY     CATARACT EXTRACTION Bilateral    Oct OD, Nov OS 2023 Dr. Zenaida Niece   TONSILLECTOMY     FAMILY HISTORY Family History  Problem Relation Age of Onset   Lupus Sister    SOCIAL HISTORY Social History   Tobacco Use   Smoking status: Never   Smokeless tobacco: Never  Vaping Use   Vaping Use: Never used  Substance Use Topics   Alcohol use: Not Currently   Drug use: Never       OPHTHALMIC EXAM:  Base Eye Exam     Visual Acuity (Snellen - Linear)       Right Left   Dist [place] 20/60 -1 20/40 -2   Dist ph  20/25 20/25 -2         Tonometry (Tonopen, 1:21 PM)       Right Left   Pressure 14 11         Pupils       Dark Light Shape React APD   Right 4 4 Round NR None   Left 4 3 Round Brisk None         Visual Fields (Counting fingers)       Left Right    Full Full         Extraocular Movement       Right Left    Full, Ortho Full, Ortho         Neuro/Psych     Oriented x3: Yes   Mood/Affect: Normal         Dilation     Both eyes: 1.0% Mydriacyl, 2.5% Phenylephrine @ 1:22 PM           Slit Lamp and Fundus Exam     Slit Lamp Exam       Right Left   Lids/Lashes Dermatochalasis - upper lid, Meibomian gland dysfunction, Telangiectasia Dermatochalasis - upper lid, Meibomian gland dysfunction   Conjunctiva/Sclera White and quiet White and quiet   Cornea Mild tear film debris, mild arcus, focal corneal haze at 0730, well healed cataract wound arcus, well healed cataract wound, trace tear film debris   Anterior Chamber deep and clear deep and clear   Iris Round and dilated, scattered transillumination defects greatest IT quad, focal atrophy at 0700 Round and moderately dilated   Lens PC IOL in good position PC IOL in good position   Anterior Vitreous mild syneresis mild syneresis         Fundus Exam       Right Left   Disc Pink and Sharp Pink  and Sharp, temporal PPA   C/D Ratio 0.6 0.5   Macula Flat, trace cystic changes -- stably  improved, mild ERM, Good foveal reflex Flat, Blunted foveal reflex, central cystic changes -- slightly increased, no heme   Vessels attenuated, mild tortuosity attenuated, mild tortuosity   Periphery Attached, reticular degeneration Attached, focal CR atrophy at 1030, no heme, reticular degeneration           IMAGING AND PROCEDURES  Imaging and Procedures for 02/24/2023  OCT, Retina - OU - Both Eyes       Right Eye Quality was good. Central Foveal Thickness: 278. Progression has improved. Findings include normal foveal contour, no IRF, no SRF (Stable improvement in IRF / cystic changes temporal fovea and mac-- no CME, partial PVD).   Left Eye Quality was good. Central Foveal Thickness: 297. Progression has worsened. Findings include normal foveal contour, no SRF (Mild Interval increase in IRF nasal foveal, partial PVD).   Notes *Images captured and stored on drive  Diagnosis / Impression:  CME OU (OS >> OD) OD: Stable improvement in IRF / cystic changes temporal fovea and mac-- no CME, partial PVD OS: Mild Interval increase in IRF nasal foveal, partial PVD  Clinical management:  See below  Abbreviations: NFP - Normal foveal profile. CME - cystoid macular edema. PED - pigment epithelial detachment. IRF - intraretinal fluid. SRF - subretinal fluid. EZ - ellipsoid zone. ERM - epiretinal membrane. ORA - outer retinal atrophy. ORT - outer retinal tubulation. SRHM - subretinal hyper-reflective material. IRHM - intraretinal hyper-reflective material            ASSESSMENT/PLAN:    ICD-10-CM   1. Cystoid macular edema of both eyes  H35.353 OCT, Retina - OU - Both Eyes    2. Diabetes mellitus type 2 without retinopathy (HCC)  E11.9     3. Essential hypertension  I10     4. Hypertensive retinopathy of both eyes  H35.033     5. Pseudophakia, both eyes  Z96.1      CME OU -- stably  resolved OD, recurrent OS - s/p CEIOL OU, Dr. Zenaida Niece (OD 09.20.23, OS 11.29.23) - FA 01.30.24 shows perifoveal petaloid leakage and hyperfluorescence of disc OU -- consistent with CME / [place] - s/p STK OS #1 (01.30.24) - s/p STK OD #1 (02.13.24)  - diagnosed and started PF and ketorolac QID OU on 01.12.24 - no significant improvement on 01.26.24 f/u with Dr. Zenaida Niece and referred here for further evaluation and management  - BCVA 20/25 OU - stable  - was reduced to qdaily OU by Dr. Zenaida Niece on 04.10.24, then pt ran out of drops last week - OCT shows OD: Stable improvement in IRF / cystic changes temporal fovea and mac-- no CME; OS: Mild Interval increase in IRF nasal fovea - restart PF and Ketorolac to TID OS - discussed repeat STK OS -- will hold off for now - STK informed consent obtained and signed, 01.30.24 (OU) - f/u 6 weeks, DFE, OCT, possible STK OS  2. Diabetes mellitus, type 2 without retinopathy  - A1c in the 4-5 range, not on medication - The incidence, risk factors for progression, natural history and treatment options for diabetic retinopathy  were discussed with patient.   - The need for close monitoring of blood glucose, blood pressure, and serum lipids, avoiding cigarette or any type of tobacco, and the need for long term follow up was also discussed with patient. - f/u in 1 year, sooner prn  3,4. Hypertensive retinopathy OU - discussed importance of tight BP control - continue to monitor  5. Pseudophakia OU  -  s/p CE/IOL OU, Dr. Zenaida Niece (OD 09.20.23, OS 11.29.23)  - IOLs in good position  - post op CME as above   Ophthalmic Meds Ordered this visit:  Meds ordered this encounter  Medications   ketorolac (ACULAR) 0.5 % ophthalmic solution    Sig: Place 1 drop into the left eye 3 (three) times daily.    Dispense:  10 mL    Refill:  3   prednisoLONE acetate (PRED FORTE) 1 % ophthalmic suspension    Sig: Place 1 drop into the left eye 3 (three) times daily.    Dispense:  10 mL     Refill:  3     Return in about 6 weeks (around 04/07/2023) for CME OS, DFE, OCT.  There are no Patient Instructions on file for this visit.   Explained the diagnoses, plan, and follow up with the patient and they expressed understanding.  Patient expressed understanding of the importance of proper follow up care.   This document serves as a record of services personally performed by Karie Chimera, MD, PhD. It was created on their behalf by Gerilyn Nestle, COT an ophthalmic technician. The creation of this record is the provider's dictation and/or activities during the visit.    Electronically signed by:  Gerilyn Nestle, COT  6.04.24 8:42 PM  This document serves as a record of services personally performed by Karie Chimera, MD, PhD. It was created on their behalf by Glee Arvin. Manson Passey, OA an ophthalmic technician. The creation of this record is the provider's dictation and/or activities during the visit.    Electronically signed by: Glee Arvin. Manson Passey, [place] 06.04.2024 8:42 PM  Karie Chimera, M.D., Ph.D. Diseases & Surgery of the Retina and Vitreous [facility]  I have reviewed the above documentation for accuracy and completeness, and I agree with the above. Karie Chimera, M.D., Ph.D. 02/24/23 8:45 PM   Abbreviations: M myopia (nearsighted); A astigmatism; H hyperopia (farsighted); P presbyopia; Mrx spectacle prescription;  CTL contact lenses; OD right eye; OS left eye; OU both eyes  XT exotropia; ET esotropia; PEK punctate epithelial keratitis; PEE punctate epithelial erosions; DES dry eye syndrome; MGD meibomian gland dysfunction; ATs artificial tears; PFAT's preservative free artificial tears; NSC nuclear sclerotic cataract; PSC posterior subcapsular cataract; ERM epi-retinal membrane; PVD posterior vitreous detachment; RD retinal detachment; DM diabetes mellitus; DR diabetic retinopathy; NPDR non-proliferative diabetic retinopathy; PDR proliferative  diabetic retinopathy; CSME clinically significant macular edema; DME diabetic macular edema; dbh dot blot hemorrhages; CWS cotton wool spot; POAG primary open angle glaucoma; C/D cup-to-disc ratio; HVF humphrey visual field; GVF goldmann visual field; OCT optical coherence tomography; IOP intraocular pressure; BRVO Branch retinal vein occlusion; CRVO central retinal vein occlusion; CRAO central retinal artery occlusion; BRAO branch retinal artery occlusion; RT retinal tear; SB scleral buckle; PPV pars plana vitrectomy; VH Vitreous hemorrhage; PRP panretinal laser photocoagulation; IVK intravitreal kenalog; VMT vitreomacular traction; MH Macular hole;  NVD neovascularization of the disc; NVE neovascularization elsewhere; AREDS age related eye disease study; ARMD age related macular degeneration; POAG primary open angle glaucoma; EBMD epithelial/anterior basement membrane dystrophy; ACIOL anterior chamber intraocular lens; IOL intraocular lens; PCIOL posterior chamber intraocular lens; Phaco/IOL phacoemulsification with intraocular lens placement; PRK photorefractive keratectomy; LASIK laser assisted in situ keratomileusis; HTN hypertension; DM diabetes mellitus; COPD chronic obstructive pulmonary disease

## 2023-02-24 ENCOUNTER — Encounter (INDEPENDENT_AMBULATORY_CARE_PROVIDER_SITE_OTHER): Payer: Self-pay | Admitting: Ophthalmology

## 2023-02-24 ENCOUNTER — Ambulatory Visit (INDEPENDENT_AMBULATORY_CARE_PROVIDER_SITE_OTHER): Payer: Medicare Other | Admitting: Ophthalmology

## 2023-02-24 DIAGNOSIS — H35033 Hypertensive retinopathy, bilateral: Secondary | ICD-10-CM

## 2023-02-24 DIAGNOSIS — H35353 Cystoid macular degeneration, bilateral: Secondary | ICD-10-CM | POA: Diagnosis not present

## 2023-02-24 DIAGNOSIS — I10 Essential (primary) hypertension: Secondary | ICD-10-CM | POA: Diagnosis not present

## 2023-02-24 DIAGNOSIS — Z961 Presence of intraocular lens: Secondary | ICD-10-CM

## 2023-02-24 DIAGNOSIS — E119 Type 2 diabetes mellitus without complications: Secondary | ICD-10-CM

## 2023-02-24 MED ORDER — PREDNISOLONE ACETATE 1 % OP SUSP: 1.0000 [drp] | Freq: Three times a day (TID) | OPHTHALMIC | 3 refills | Status: DC

## 2023-02-24 MED ORDER — KETOROLAC TROMETHAMINE 0.5 % OP SOLN: 1.0000 [drp] | Freq: Three times a day (TID) | OPHTHALMIC | 3 refills | Status: DC

## 2023-03-31 NOTE — Progress Notes (Signed)
[facility] - Clinic Note  04/07/2023     CHIEF COMPLAINT Patient presents for Retina Follow Up   HISTORY OF PRESENT ILLNESS: Walter Baxter is a 82 y.o. male who presents to the clinic today for:   HPI     Retina Follow Up   Patient presents with  Other.  In both eyes.  Severity is moderate.  Duration of 6 weeks.  Since onset it is stable.  I, the attending physician,  performed the HPI with the patient and updated documentation appropriately.        Comments   Pt here for 6 wk ret f/u CME OU. Pt states VA is about the same. Feels distance VA is declining recently.       Last edited by Rennis Chris, MD on 04/07/2023  4:57 PM.    Patient states he cannot see at a distance, he is using PF and ketorolac TID OS   Referring physician: Diona Foley, MD [address] [place],  [place] [address]  HISTORICAL INFORMATION:   Selected notes from the MEDICAL RECORD NUMBER Referred by Dr. Zenaida Niece for CME OU LEE:  Ocular Hx-  s/p CEIOL OD 09.20.23 S/p CEIOL OS 11.29.23 Currently on PF and Ketorolac QID OU -- started 01.12.24 PMH-    CURRENT MEDICATIONS: Current Outpatient Medications (Ophthalmic Drugs)  Medication Sig   ketorolac (ACULAR) 0.5 % ophthalmic solution Place 1 drop into the left eye 3 (three) times daily.   prednisoLONE acetate (PRED FORTE) 1 % ophthalmic suspension Place 1 drop into the left eye 3 (three) times daily.   No current facility-administered medications for this visit. (Ophthalmic Drugs)   Current Outpatient Medications (Other)  Medication Sig   acetaminophen (TYLENOL) 500 MG tablet Take 1,000 mg by mouth every 6 (six) hours as needed for mild pain or headache.   aspirin EC 81 MG tablet Take 81 mg by mouth daily.   clonazePAM (KLONOPIN) 0.5 MG tablet Take 0.5 mg by mouth daily as needed.   guaiFENesin (MUCINEX) 600 MG 12 hr tablet Take 600 mg by mouth 2 (two) times daily as needed for cough or to loosen phlegm.    levothyroxine (SYNTHROID) 25 MCG tablet Take 25 mcg by mouth every morning.   metoprolol tartrate (LOPRESSOR) 50 MG tablet Take 50 mg by mouth 2 (two) times daily.   Multiple Vitamins-Minerals (MULTIVITAMIN WITH MINERALS) tablet Take 1 tablet by mouth daily.   pravastatin (PRAVACHOL) 20 MG tablet Take 20 mg by mouth daily.   tamsulosin (FLOMAX) 0.4 MG CAPS capsule Take 0.4 mg by mouth daily.   famotidine (PEPCID) 20 MG tablet Take 20 mg by mouth 2 (two) times daily. (Patient not taking: Reported on 04/07/2023)   montelukast (SINGULAIR) 10 MG tablet Take 10 mg by mouth at bedtime. (Patient not taking: Reported on 11/04/2022)   promethazine-codeine (PHENERGAN WITH CODEINE) 6.25-10 MG/5ML syrup Take 5 mLs by mouth every 6 (six) hours as needed for cough. (Patient not taking: Reported on 10/21/2022)   Rivaroxaban 15 & 20 MG TBPK Follow package directions: Take one 15mg  tablet by mouth twice a day. On day 22, switch to one 20mg  tablet once a day. Take with food. (Patient not taking: Reported on 10/21/2022)   No current facility-administered medications for this visit. (Other)   REVIEW OF SYSTEMS: ROS   Positive for: Endocrine, Cardiovascular, Eyes Negative for: Constitutional, Gastrointestinal, Neurological, Skin, Genitourinary, Musculoskeletal, HENT, Respiratory, Psychiatric, Allergic/Imm, Heme/Lymph Last edited by Thompson Grayer, COT on 04/07/2023  1:06 PM.      ALLERGIES Allergies  Allergen Reactions   Other Rash    Magic mouth wash    PAST MEDICAL HISTORY Past Medical History:  Diagnosis Date   Allergic rhinitis    Angio-edema    Chronic bronchitis (HCC)    Erectile dysfunction    Essential hypertension    Hyperlipidemia    Obesity    Type 2 diabetes mellitus (HCC)    Past Surgical History:  Procedure Laterality Date   ADENOIDECTOMY     CATARACT EXTRACTION Bilateral    Oct OD, Nov OS 2023 Dr. Zenaida Niece   TONSILLECTOMY     FAMILY HISTORY Family History  Problem Relation Age of  Onset   Lupus Sister    SOCIAL HISTORY Social History   Tobacco Use   Smoking status: Never   Smokeless tobacco: Never  Vaping Use   Vaping status: Never Used  Substance Use Topics   Alcohol use: Not Currently   Drug use: Never       OPHTHALMIC EXAM:  Base Eye Exam     Visual Acuity (Snellen - Linear)       Right Left   Dist [place] 20/30 -2 20/80   Dist ph [place] 20/30 +1 20/30         Tonometry (Tonopen, 1:14 PM)       Right Left   Pressure 10 10         Pupils       Dark Light Shape React APD   Right 4 4 Round NR None   Left 4 3 Round Brisk None         Visual Fields (Counting fingers)       Left Right    Full Full         Extraocular Movement       Right Left    Full, Ortho Full, Ortho         Neuro/Psych     Oriented x3: Yes   Mood/Affect: Normal         Dilation     Both eyes: 1.0% Mydriacyl, 2.5% Phenylephrine @ 1:15 PM           Slit Lamp and Fundus Exam     Slit Lamp Exam       Right Left   Lids/Lashes Dermatochalasis - upper lid, Meibomian gland dysfunction, Telangiectasia Dermatochalasis - upper lid, Meibomian gland dysfunction   Conjunctiva/Sclera White and quiet White and quiet   Cornea tear film debris, mild arcus, well healed cataract wound, 1+ Punctate epithelial erosions arcus, well healed cataract wound, tear film debris, STK gone   Anterior Chamber deep and clear deep and clear   Iris irregular dilation, notch at 0700, scattered transillumination defects greatest IT quad Round and moderately dilated   Lens PC IOL in good position PC IOL in good position   Anterior Vitreous mild syneresis mild syneresis         Fundus Exam       Right Left   Disc Pink and Sharp Pink and Sharp, temporal PPA   C/D Ratio 0.6 0.5   Macula Flat, trace cystic changes -- stably improved, mild ERM, Good foveal reflex, No heme or edema Flat, Blunted foveal reflex, central cystic changes -- slightly increased, no heme   Vessels  attenuated, Tortuous attenuated, Tortuous   Periphery Attached, reticular degeneration, No heme Attached, focal CR atrophy at 1030, no heme, reticular degeneration  IMAGING AND PROCEDURES  Imaging and Procedures for 04/07/2023  OCT, Retina - OU - Both Eyes       Right Eye Quality was good. Central Foveal Thickness: 277. Progression has been stable. Findings include normal foveal contour, no IRF, no SRF (Stable improvement in IRF / cystic changes temporal fovea and mac-- no CME, partial PVD).   Left Eye Quality was good. Central Foveal Thickness: 303. Progression has worsened. Findings include normal foveal contour, no SRF (persistent IRF nasal foveal -- slightly increased, partial PVD).   Notes *Images captured and stored on drive  Diagnosis / Impression:  CME OU (OS >> OD) OD: Stable improvement in IRF / cystic changes temporal fovea and mac-- no CME, partial PVD OS: persistent IRF nasal foveal -- slightly increased, partial PVD  Clinical management:  See below  Abbreviations: NFP - Normal foveal profile. CME - cystoid macular edema. PED - pigment epithelial detachment. IRF - intraretinal fluid. SRF - subretinal fluid. EZ - ellipsoid zone. ERM - epiretinal membrane. ORA - outer retinal atrophy. ORT - outer retinal tubulation. SRHM - subretinal hyper-reflective material. IRHM - intraretinal hyper-reflective material      Injection into Tenon's Capsule - OS - Left Eye       Time Out 04/07/2023. 1:39 PM. Confirmed correct patient, procedure, site, and patient consented.   Anesthesia Topical anesthesia was used. Anesthetic medications included Lidocaine 2%, Proparacaine 0.5%.   Procedure Preparation included 5% betadine to ocular surface, eyelid speculum. A (25g) needle was used.   Injection: 40 mg triamcinolone acetonide 40 MG/ML   Route: Other, Site: Left Eye   NDC: [id number], Lot: [id number], Expiration date: 05/22/2024   Post-op Post injection exam found  visual acuity of at least counting fingers. The patient tolerated the procedure well. There were no complications. The patient received written and verbal post procedure care education. Post injection medications were not given.   Notes 1.0 cc of Kenalog-40 (40 mg) injected into subtenon's capsule in the superotemporal quadrant. Betadine was applied to Injection area pre and post-injection then rinsed with sterile BSS. 1 drop of polymixin was instilled into the eye. There were no complications. Pt tolerated procedure well.           ASSESSMENT/PLAN:    ICD-10-CM   1. Cystoid macular edema of both eyes  H35.353 OCT, Retina - OU - Both Eyes    Injection into Tenon's Capsule - OS - Left Eye    triamcinolone acetonide (KENALOG-40) injection 40 mg    2. Diabetes mellitus type 2 without retinopathy (HCC)  E11.9     3. Essential hypertension  I10     4. Hypertensive retinopathy of both eyes  H35.033     5. Pseudophakia, both eyes  Z96.1      CME OU -- stably resolved OD, recurrent and persistent OS - s/p CEIOL OU, Dr. Zenaida Niece (OD 09.20.23, OS 11.29.23) - FA 01.30.24 shows perifoveal petaloid leakage and hyperfluorescence of disc OU -- consistent with CME / [place] - s/p STK OS #1 (01.30.24) - s/p STK OD #1 (02.13.24)  - diagnosed and started PF and ketorolac QID OU on 01.12.24 - no significant improvement on 01.26.24 f/u with Dr. Zenaida Niece and referred here for further evaluation and management  - BCVA 20/30 OU - stable  - was reduced to qdaily OU by Dr. Zenaida Niece on 04.10.24 - OCT shows OD: Stable improvement in IRF / cystic changes temporal fovea and mac-- no CME; OS: persistent IRF nasal fovea -- minimal improvement -  cont PF and Ketorolac QID OS - recommend STK OS #2 today, 07.16.24 - pt wishes to proceed with injection - RBA of procedure discussed, questions answered - see procedure note  - STK informed consent obtained and signed, 01.30.24 (OU) - f/u 6 weeks, DFE, OCT  2. Diabetes  mellitus, type 2 without retinopathy  - A1c in the 4-5 range, not on medication - The incidence, risk factors for progression, natural history and treatment options for diabetic retinopathy  were discussed with patient.   - The need for close monitoring of blood glucose, blood pressure, and serum lipids, avoiding cigarette or any type of tobacco, and the need for long term follow up was also discussed with patient. - f/u in 1 year, sooner prn  3,4. Hypertensive retinopathy OU - discussed importance of tight BP control - continue to monitor  5. Pseudophakia OU  - s/p CE/IOL OU, Dr. Zenaida Niece (OD 09.20.23, OS 11.29.23)  - IOLs in good position  - post op CME as above   Ophthalmic Meds Ordered this visit:  Meds ordered this encounter  Medications   triamcinolone acetonide (KENALOG-40) injection 40 mg     Return in about 6 weeks (around 05/19/2023) for f/u CME OU, DFE, OCT.  There are no Patient Instructions on file for this visit.   Explained the diagnoses, plan, and follow up with the patient and they expressed understanding.  Patient expressed understanding of the importance of proper follow up care.   This document serves as a record of services personally performed by Karie Chimera, MD, PhD. It was created on their behalf by Gerilyn Nestle, COT an ophthalmic technician. The creation of this record is the provider's dictation and/or activities during the visit.    Electronically signed by:  Charlette Caffey, COT  04/07/23 5:01 PM  This document serves as a record of services personally performed by Karie Chimera, MD, PhD. It was created on their behalf by Glee Arvin. Manson Passey, OA an ophthalmic technician. The creation of this record is the provider's dictation and/or activities during the visit.    Electronically signed by: Glee Arvin. Manson Passey, OA 04/07/23 5:01 PM  Karie Chimera, M.D., Ph.D. Diseases & Surgery of the Retina and Vitreous [facility] [place]  I have  reviewed the above documentation for accuracy and completeness, and I agree with the above. Karie Chimera, M.D., Ph.D. 04/07/23 5:12 PM   Abbreviations: M myopia (nearsighted); A astigmatism; H hyperopia (farsighted); P presbyopia; Mrx spectacle prescription;  CTL contact lenses; OD right eye; OS left eye; OU both eyes  XT exotropia; ET esotropia; PEK punctate epithelial keratitis; PEE punctate epithelial erosions; DES dry eye syndrome; MGD meibomian gland dysfunction; ATs artificial tears; PFAT's preservative free artificial tears; NSC nuclear sclerotic cataract; PSC posterior subcapsular cataract; ERM epi-retinal membrane; PVD posterior vitreous detachment; RD retinal detachment; DM diabetes mellitus; DR diabetic retinopathy; NPDR non-proliferative diabetic retinopathy; PDR proliferative diabetic retinopathy; CSME clinically significant macular edema; DME diabetic macular edema; dbh dot blot hemorrhages; CWS cotton wool spot; POAG primary open angle glaucoma; C/D cup-to-disc ratio; HVF humphrey visual field; GVF goldmann visual field; OCT optical coherence tomography; IOP intraocular pressure; BRVO Branch retinal vein occlusion; CRVO central retinal vein occlusion; CRAO central retinal artery occlusion; BRAO branch retinal artery occlusion; RT retinal tear; SB scleral buckle; PPV pars plana vitrectomy; VH Vitreous hemorrhage; PRP panretinal laser photocoagulation; IVK intravitreal kenalog; VMT vitreomacular traction; MH Macular hole;  NVD neovascularization of the disc; NVE neovascularization elsewhere;  AREDS age related eye disease study; ARMD age related macular degeneration; POAG primary open angle glaucoma; EBMD epithelial/anterior basement membrane dystrophy; ACIOL anterior chamber intraocular lens; IOL intraocular lens; PCIOL posterior chamber intraocular lens; Phaco/IOL phacoemulsification with intraocular lens placement; PRK photorefractive keratectomy; LASIK laser assisted in situ keratomileusis;  HTN hypertension; DM diabetes mellitus; COPD chronic obstructive pulmonary disease

## 2023-04-07 ENCOUNTER — Ambulatory Visit (INDEPENDENT_AMBULATORY_CARE_PROVIDER_SITE_OTHER): Payer: Medicare Other | Admitting: Ophthalmology

## 2023-04-07 ENCOUNTER — Encounter (INDEPENDENT_AMBULATORY_CARE_PROVIDER_SITE_OTHER): Payer: Self-pay | Admitting: Ophthalmology

## 2023-04-07 DIAGNOSIS — H35353 Cystoid macular degeneration, bilateral: Secondary | ICD-10-CM | POA: Diagnosis not present

## 2023-04-07 DIAGNOSIS — I10 Essential (primary) hypertension: Secondary | ICD-10-CM

## 2023-04-07 DIAGNOSIS — Z961 Presence of intraocular lens: Secondary | ICD-10-CM | POA: Diagnosis not present

## 2023-04-07 DIAGNOSIS — E119 Type 2 diabetes mellitus without complications: Secondary | ICD-10-CM

## 2023-04-07 DIAGNOSIS — H35033 Hypertensive retinopathy, bilateral: Secondary | ICD-10-CM

## 2023-04-07 MED ORDER — TRIAMCINOLONE ACETONIDE 40 MG/ML IJ SUSP FOR KALEIDOSCOPE
40.0000 mg | INTRAMUSCULAR | Status: AC | PRN
  Administered 2023-04-07: 40 mg

## 2023-04-14 DIAGNOSIS — E039 Hypothyroidism, unspecified: Secondary | ICD-10-CM | POA: Diagnosis not present

## 2023-04-14 DIAGNOSIS — I10 Essential (primary) hypertension: Secondary | ICD-10-CM | POA: Diagnosis not present

## 2023-04-14 DIAGNOSIS — Z0000 Encounter for general adult medical examination without abnormal findings: Secondary | ICD-10-CM | POA: Diagnosis not present

## 2023-04-14 DIAGNOSIS — Z8616 Personal history of COVID-19: Secondary | ICD-10-CM | POA: Diagnosis not present

## 2023-04-14 DIAGNOSIS — J42 Unspecified chronic bronchitis: Secondary | ICD-10-CM | POA: Diagnosis not present

## 2023-04-14 DIAGNOSIS — E756 Lipid storage disorder, unspecified: Secondary | ICD-10-CM | POA: Diagnosis not present

## 2023-04-14 DIAGNOSIS — E1169 Type 2 diabetes mellitus with other specified complication: Secondary | ICD-10-CM | POA: Diagnosis not present

## 2023-04-14 DIAGNOSIS — J309 Allergic rhinitis, unspecified: Secondary | ICD-10-CM | POA: Diagnosis not present

## 2023-04-14 DIAGNOSIS — E782 Mixed hyperlipidemia: Secondary | ICD-10-CM | POA: Diagnosis not present

## 2023-04-28 DIAGNOSIS — N179 Acute kidney failure, unspecified: Secondary | ICD-10-CM | POA: Diagnosis not present

## 2023-05-05 NOTE — Progress Notes (Signed)
[facility] - Clinic Note  05/19/2023     CHIEF COMPLAINT Patient presents for Retina Follow Up   HISTORY OF PRESENT ILLNESS: Walter Baxter is a 82 y.o. male who presents to the clinic today for:   HPI     Retina Follow Up   Patient presents with  Other.  In both eyes.  Severity is moderate.  Duration of 6 weeks.  Since onset it is stable.  I, the attending physician,  performed the HPI with the patient and updated documentation appropriately.        Comments   Pt here for 6 wk ret f/u CME OU. Pt states VA the same, can't tell a difference. Reports using PF and ketorolac QID OS.       Last edited by Rennis Chris, MD on 05/21/2023  2:25 AM.    Patient states vision is a little better, he is using PF and ketorolac QID OS   Referring physician: Diona Foley, MD [address] [place],  [place] [address]  HISTORICAL INFORMATION:   Selected notes from the MEDICAL RECORD NUMBER Referred by Dr. Zenaida Niece for CME OU LEE:  Ocular Hx-  s/p CEIOL OD 09.20.23 S/p CEIOL OS 11.29.23 Currently on PF and Ketorolac QID OU -- started 01.12.24 PMH-    CURRENT MEDICATIONS: Current Outpatient Medications (Ophthalmic Drugs)  Medication Sig   ketorolac (ACULAR) 0.5 % ophthalmic solution Place 1 drop into the left eye 4 (four) times daily.   prednisoLONE acetate (PRED FORTE) 1 % ophthalmic suspension Place 1 drop into the left eye 4 (four) times daily.   No current facility-administered medications for this visit. (Ophthalmic Drugs)   Current Outpatient Medications (Other)  Medication Sig   acetaminophen (TYLENOL) 500 MG tablet Take 1,000 mg by mouth every 6 (six) hours as needed for mild pain or headache.   aspirin EC 81 MG tablet Take 81 mg by mouth daily.   clonazePAM (KLONOPIN) 0.5 MG tablet Take 0.5 mg by mouth daily as needed.   famotidine (PEPCID) 20 MG tablet Take 20 mg by mouth 2 (two) times daily.   guaiFENesin (MUCINEX) 600 MG 12 hr tablet Take 600  mg by mouth 2 (two) times daily as needed for cough or to loosen phlegm.   levothyroxine (SYNTHROID) 25 MCG tablet Take 25 mcg by mouth every morning.   metoprolol tartrate (LOPRESSOR) 50 MG tablet Take 50 mg by mouth 2 (two) times daily.   montelukast (SINGULAIR) 10 MG tablet Take 10 mg by mouth at bedtime.   Multiple Vitamins-Minerals (MULTIVITAMIN WITH MINERALS) tablet Take 1 tablet by mouth daily.   pravastatin (PRAVACHOL) 20 MG tablet Take 20 mg by mouth daily.   promethazine-codeine (PHENERGAN WITH CODEINE) 6.25-10 MG/5ML syrup Take 5 mLs by mouth every 6 (six) hours as needed for cough.   Rivaroxaban 15 & 20 MG TBPK Follow package directions: Take one 15mg  tablet by mouth twice a day. On day 22, switch to one 20mg  tablet once a day. Take with food.   tamsulosin (FLOMAX) 0.4 MG CAPS capsule Take 0.4 mg by mouth daily.   No current facility-administered medications for this visit. (Other)   REVIEW OF SYSTEMS: ROS   Positive for: Endocrine, Cardiovascular, Eyes Negative for: Constitutional, Gastrointestinal, Neurological, Skin, Genitourinary, Musculoskeletal, HENT, Respiratory, Psychiatric, Allergic/Imm, Heme/Lymph Last edited by Thompson Grayer, COT on 05/19/2023  1:25 PM.       ALLERGIES Allergies  Allergen Reactions   Other Rash    Magic  mouth wash    PAST MEDICAL HISTORY Past Medical History:  Diagnosis Date   Allergic rhinitis    Angio-edema    Chronic bronchitis (HCC)    Erectile dysfunction    Essential hypertension    Hyperlipidemia    Obesity    Type 2 diabetes mellitus (HCC)    Past Surgical History:  Procedure Laterality Date   ADENOIDECTOMY     CATARACT EXTRACTION Bilateral    Oct OD, Nov OS 2023 Dr. Zenaida Niece   TONSILLECTOMY     FAMILY HISTORY Family History  Problem Relation Age of Onset   Lupus Sister    SOCIAL HISTORY Social History   Tobacco Use   Smoking status: Never   Smokeless tobacco: Never  Vaping Use   Vaping status: Never Used   Substance Use Topics   Alcohol use: Not Currently   Drug use: Never       OPHTHALMIC EXAM:  Base Eye Exam     Visual Acuity (Snellen - Linear)       Right Left   Dist [place] 20/40 20/70   Dist ph [place] 20/25 -1 20/25 -1         Tonometry (Tonopen, 1:32 PM)       Right Left   Pressure 10 13         Pupils       Dark Light Shape React APD   Right 4 4 Round NR None   Left 4 3 Round Brisk None         Visual Fields (Counting fingers)       Left Right    Full Full         Extraocular Movement       Right Left    Full, Ortho Full, Ortho         Neuro/Psych     Oriented x3: Yes   Mood/Affect: Normal         Dilation     Both eyes: 1.0% Mydriacyl, 2.5% Phenylephrine @ 1:33 PM           Slit Lamp and Fundus Exam     Slit Lamp Exam       Right Left   Lids/Lashes Dermatochalasis - upper lid, Meibomian gland dysfunction, Telangiectasia Dermatochalasis - upper lid, Meibomian gland dysfunction   Conjunctiva/Sclera White and quiet White and quiet, STK ST quad   Cornea tear film debris, mild arcus, well healed cataract wound, 1+ Punctate epithelial erosions arcus, well healed cataract wound, tear film debris   Anterior Chamber deep and clear deep and clear, no cell or flare   Iris irregular dilation, notch at 0700, scattered transillumination defects greatest IT quad Round and moderately dilated   Lens PC IOL in good position PC IOL in good position   Anterior Vitreous mild syneresis mild syneresis         Fundus Exam       Right Left   Disc Pink and Sharp Pink and Sharp, temporal PPA   C/D Ratio 0.6 0.5   Macula Flat, trace cystic changes -- stably improved, mild ERM, Good foveal reflex, No heme or edema Flat, Blunted foveal reflex, central cystic changes -- slightly improved, no heme   Vessels attenuated, Tortuous attenuated, mild tortuosity   Periphery Attached, reticular degeneration, No heme Attached, focal CR atrophy at 1030, no heme,  reticular degeneration           IMAGING AND PROCEDURES  Imaging and Procedures for 05/19/2023  OCT, Retina -  OU - Both Eyes       Right Eye Quality was good. Central Foveal Thickness: 272. Progression has been stable. Findings include normal foveal contour, no IRF, no SRF (Stable improvement in IRF / cystic changes temporal fovea and mac-- no CME, partial PVD).   Left Eye Quality was good. Central Foveal Thickness: 281. Progression has improved. Findings include normal foveal contour, no SRF, intraretinal fluid (persistent IRF nasal foveal -- slightly improved, partial PVD).   Notes *Images captured and stored on drive  Diagnosis / Impression:  CME OU (OS >> OD) OD: Stable improvement in IRF / cystic changes temporal fovea and mac-- no CME, partial PVD OS: persistent IRF nasal foveal -- slightly improved, partial PVD  Clinical management:  See below  Abbreviations: NFP - Normal foveal profile. CME - cystoid macular edema. PED - pigment epithelial detachment. IRF - intraretinal fluid. SRF - subretinal fluid. EZ - ellipsoid zone. ERM - epiretinal membrane. ORA - outer retinal atrophy. ORT - outer retinal tubulation. SRHM - subretinal hyper-reflective material. IRHM - intraretinal hyper-reflective material            ASSESSMENT/PLAN:    ICD-10-CM   1. Cystoid macular edema of both eyes  H35.353 OCT, Retina - OU - Both Eyes    2. Diabetes mellitus type 2 without retinopathy (HCC)  E11.9     3. Essential hypertension  I10     4. Hypertensive retinopathy of both eyes  H35.033     5. Pseudophakia, both eyes  Z96.1       CME OU -- stably resolved OD, recurrent and persistent OS - s/p CEIOL OU, Dr. Zenaida Niece (OD 09.20.23, OS 11.29.23) - no significant improvement on 01.26.24 f/u with Dr. Zenaida Niece and referred here for further evaluation and management - FA 01.30.24 shows perifoveal petaloid leakage and hyperfluorescence of disc OU -- consistent with CME / [place] - s/p STK OD  #1 (02.13.24) - s/p STK OS #1 (01.30.24), #2 (07.16.24)  - diagnosed and started PF and ketorolac QID OU on 01.12.24  - BCVA 20/25 OU - stable  - was reduced to qdaily OU by Dr. Zenaida Niece on 04.10.24 - OCT shows OD: Stable improvement in IRF / cystic changes temporal fovea and mac-- no CME; OS: persistent IRF nasal fovea -- slightly improved - cont PF and Ketorolac QID OS - STK informed consent obtained and signed, 01.30.24 (OU) - f/u 6 weeks, DFE, OCT  2. Diabetes mellitus, type 2 without retinopathy  - A1c in the 4-5 range, not on medication - The incidence, risk factors for progression, natural history and treatment options for diabetic retinopathy  were discussed with patient.   - The need for close monitoring of blood glucose, blood pressure, and serum lipids, avoiding cigarette or any type of tobacco, and the need for long term follow up was also discussed with patient. - f/u in 1 year, sooner prn  3,4. Hypertensive retinopathy OU - discussed importance of tight BP control - continue to monitor  5. Pseudophakia OU  - s/p CE/IOL OU, Dr. Zenaida Niece (OD 09.20.23, OS 11.29.23)  - IOLs in good position  - post op CME as above   Ophthalmic Meds Ordered this visit:  Meds ordered this encounter  Medications   ketorolac (ACULAR) 0.5 % ophthalmic solution    Sig: Place 1 drop into the left eye 4 (four) times daily.    Dispense:  10 mL    Refill:  3   prednisoLONE acetate (PRED FORTE) 1 % ophthalmic  suspension    Sig: Place 1 drop into the left eye 4 (four) times daily.    Dispense:  10 mL    Refill:  3     Return in about 6 weeks (around 06/30/2023) for f/u CME OU, DFE, OCT.  There are no Patient Instructions on file for this visit.   Explained the diagnoses, plan, and follow up with the patient and they expressed understanding.  Patient expressed understanding of the importance of proper follow up care.   This document serves as a record of services personally performed by Karie Chimera, MD, PhD. It was created on their behalf by Laurey Morale, COT an ophthalmic technician. The creation of this record is the provider's dictation and/or activities during the visit.    Electronically signed by:  Charlette Caffey, COT  05/21/23 2:25 AM  This document serves as a record of services personally performed by Karie Chimera, MD, PhD. It was created on their behalf by Glee Arvin. Manson Passey, OA an ophthalmic technician. The creation of this record is the provider's dictation and/or activities during the visit.    Electronically signed by: Glee Arvin. Manson Passey, OA 05/21/23 2:25 AM  Karie Chimera, M.D., Ph.D. Diseases & Surgery of the Retina and Vitreous [facility]  I have reviewed the above documentation for accuracy and completeness, and I agree with the above. Karie Chimera, M.D., Ph.D. 05/21/23 2:27 AM   Abbreviations: M myopia (nearsighted); A astigmatism; H hyperopia (farsighted); P presbyopia; Mrx spectacle prescription;  CTL contact lenses; OD right eye; OS left eye; OU both eyes  XT exotropia; ET esotropia; PEK punctate epithelial keratitis; PEE punctate epithelial erosions; DES dry eye syndrome; MGD meibomian gland dysfunction; ATs artificial tears; PFAT's preservative free artificial tears; NSC nuclear sclerotic cataract; PSC posterior subcapsular cataract; ERM epi-retinal membrane; PVD posterior vitreous detachment; RD retinal detachment; DM diabetes mellitus; DR diabetic retinopathy; NPDR non-proliferative diabetic retinopathy; PDR proliferative diabetic retinopathy; CSME clinically significant macular edema; DME diabetic macular edema; dbh dot blot hemorrhages; CWS cotton wool spot; POAG primary open angle glaucoma; C/D cup-to-disc ratio; HVF humphrey visual field; GVF goldmann visual field; OCT optical coherence tomography; IOP intraocular pressure; BRVO Branch retinal vein occlusion; CRVO central retinal vein occlusion; CRAO central retinal artery  occlusion; BRAO branch retinal artery occlusion; RT retinal tear; SB scleral buckle; PPV pars plana vitrectomy; VH Vitreous hemorrhage; PRP panretinal laser photocoagulation; IVK intravitreal kenalog; VMT vitreomacular traction; MH Macular hole;  NVD neovascularization of the disc; NVE neovascularization elsewhere; AREDS age related eye disease study; ARMD age related macular degeneration; POAG primary open angle glaucoma; EBMD epithelial/anterior basement membrane dystrophy; ACIOL anterior chamber intraocular lens; IOL intraocular lens; PCIOL posterior chamber intraocular lens; Phaco/IOL phacoemulsification with intraocular lens placement; PRK photorefractive keratectomy; LASIK laser assisted in situ keratomileusis; HTN hypertension; DM diabetes mellitus; COPD chronic obstructive pulmonary disease

## 2023-05-19 ENCOUNTER — Encounter (INDEPENDENT_AMBULATORY_CARE_PROVIDER_SITE_OTHER): Payer: Self-pay | Admitting: Ophthalmology

## 2023-05-19 ENCOUNTER — Ambulatory Visit (INDEPENDENT_AMBULATORY_CARE_PROVIDER_SITE_OTHER): Payer: Medicare Other | Admitting: Ophthalmology

## 2023-05-19 DIAGNOSIS — H35033 Hypertensive retinopathy, bilateral: Secondary | ICD-10-CM | POA: Diagnosis not present

## 2023-05-19 DIAGNOSIS — H35353 Cystoid macular degeneration, bilateral: Secondary | ICD-10-CM | POA: Diagnosis not present

## 2023-05-19 DIAGNOSIS — Z961 Presence of intraocular lens: Secondary | ICD-10-CM

## 2023-05-19 DIAGNOSIS — I10 Essential (primary) hypertension: Secondary | ICD-10-CM

## 2023-05-19 DIAGNOSIS — E119 Type 2 diabetes mellitus without complications: Secondary | ICD-10-CM

## 2023-05-19 MED ORDER — PREDNISOLONE ACETATE 1 % OP SUSP: 1.0000 [drp] | Freq: Four times a day (QID) | OPHTHALMIC | 3 refills | Status: DC

## 2023-05-19 MED ORDER — KETOROLAC TROMETHAMINE 0.5 % OP SOLN: 1.0000 [drp] | Freq: Four times a day (QID) | OPHTHALMIC | 3 refills | Status: DC

## 2023-05-21 ENCOUNTER — Encounter (INDEPENDENT_AMBULATORY_CARE_PROVIDER_SITE_OTHER): Payer: Self-pay | Admitting: Ophthalmology

## 2023-06-16 NOTE — Progress Notes (Addendum)
[facility] - Clinic Note  06/30/2023     CHIEF COMPLAINT Patient presents for Retina Follow Up   HISTORY OF PRESENT ILLNESS: Walter Baxter is a 82 y.o. male who presents to the clinic today for:   HPI     Retina Follow Up   Patient presents with  Other.  In both eyes.  Severity is moderate.  Duration of 6 weeks.  Since onset it is stable.  I, the attending physician,  performed the HPI with the patient and updated documentation appropriately.        Comments   Patient states vision not good OU. Using Pred Forte and ketorolac qid OS. Doesn't wear glasses for distance.      Last edited by Rennis Chris, MD on 07/04/2023  2:37 AM.     Patient states he can't see well at a distance.    Referring physician: Diona Foley, MD [address] [place],  [place] [address]  HISTORICAL INFORMATION:   Selected notes from the MEDICAL RECORD NUMBER Referred by Dr. Zenaida Niece for CME OU LEE:  Ocular Hx-  s/p CEIOL OD 09.20.23 S/p CEIOL OS 11.29.23 Currently on PF and Ketorolac QID OU -- started 01.12.24 PMH-    CURRENT MEDICATIONS: Current Outpatient Medications (Ophthalmic Drugs)  Medication Sig   ketorolac (ACULAR) 0.5 % ophthalmic solution Place 1 drop into the left eye 4 (four) times daily.   prednisoLONE acetate (PRED FORTE) 1 % ophthalmic suspension Place 1 drop into the left eye 4 (four) times daily.   No current facility-administered medications for this visit. (Ophthalmic Drugs)   Current Outpatient Medications (Other)  Medication Sig   acetaminophen (TYLENOL) 500 MG tablet Take 1,000 mg by mouth every 6 (six) hours as needed for mild pain or headache.   aspirin EC 81 MG tablet Take 81 mg by mouth daily.   clonazePAM (KLONOPIN) 0.5 MG tablet Take 0.5 mg by mouth daily as needed.   famotidine (PEPCID) 20 MG tablet Take 20 mg by mouth 2 (two) times daily.   guaiFENesin (MUCINEX) 600 MG 12 hr tablet Take 600 mg by mouth 2 (two) times daily as  needed for cough or to loosen phlegm.   levothyroxine (SYNTHROID) 25 MCG tablet Take 25 mcg by mouth every morning.   metoprolol tartrate (LOPRESSOR) 50 MG tablet Take 50 mg by mouth 2 (two) times daily.   montelukast (SINGULAIR) 10 MG tablet Take 10 mg by mouth at bedtime.   Multiple Vitamins-Minerals (MULTIVITAMIN WITH MINERALS) tablet Take 1 tablet by mouth daily.   pravastatin (PRAVACHOL) 20 MG tablet Take 20 mg by mouth daily.   promethazine-codeine (PHENERGAN WITH CODEINE) 6.25-10 MG/5ML syrup Take 5 mLs by mouth every 6 (six) hours as needed for cough.   Rivaroxaban 15 & 20 MG TBPK Follow package directions: Take one 15mg  tablet by mouth twice a day. On day 22, switch to one 20mg  tablet once a day. Take with food.   tamsulosin (FLOMAX) 0.4 MG CAPS capsule Take 0.4 mg by mouth daily.   No current facility-administered medications for this visit. (Other)   REVIEW OF SYSTEMS: ROS   Positive for: Endocrine, Cardiovascular, Eyes Negative for: Constitutional, Gastrointestinal, Neurological, Skin, Genitourinary, Musculoskeletal, HENT, Respiratory, Psychiatric, Allergic/Imm, Heme/Lymph Last edited by Annalee Genta D, COT on 06/30/2023  1:08 PM.     ALLERGIES Allergies  Allergen Reactions   Other Rash    Magic mouth wash    PAST MEDICAL HISTORY Past Medical History:  Diagnosis Date  Allergic rhinitis    Angio-edema    Chronic bronchitis (HCC)    Erectile dysfunction    Essential hypertension    Hyperlipidemia    Obesity    Type 2 diabetes mellitus [facility])    Past Surgical History:  Procedure Laterality Date   ADENOIDECTOMY     CATARACT EXTRACTION Bilateral    Oct OD, Nov OS 2023 Dr. Zenaida Niece   TONSILLECTOMY     FAMILY HISTORY Family History  Problem Relation Age of Onset   Lupus Sister    SOCIAL HISTORY Social History   Tobacco Use   Smoking status: Never   Smokeless tobacco: Never  Vaping Use   Vaping status: Never Used  Substance Use Topics   Alcohol use: Not  Currently   Drug use: Never       OPHTHALMIC EXAM:  Base Eye Exam     Visual Acuity (Snellen - Linear)       Right Left   Dist [place] 20/40 20/50 -2   Dist ph  20/25 -1 20/25 -2         Tonometry (Tonopen, 1:16 PM)       Right Left   Pressure 15 18         Pupils       Dark Light Shape React APD   Right 4 4 Irregular Minimal None   Left 4 3 Round Brisk None         Visual Fields (Counting fingers)       Left Right    Full Full         Extraocular Movement       Right Left    Full, Ortho Full, Ortho         Neuro/Psych     Oriented x3: Yes   Mood/Affect: Normal         Dilation     Both eyes: 1.0% Mydriacyl, 2.5% Phenylephrine @ 1:17 PM           Slit Lamp and Fundus Exam     Slit Lamp Exam       Right Left   Lids/Lashes Dermatochalasis - upper lid, Meibomian gland dysfunction, Telangiectasia Dermatochalasis - upper lid, Meibomian gland dysfunction   Conjunctiva/Sclera White and quiet White and quiet, STK ST quad   Cornea tear film debris, mild arcus, well healed cataract wound, 1+ Punctate epithelial erosions, EBMD, find endopigment arcus, well healed cataract wound, tear film debris   Anterior Chamber deep and clear, no cell/flare deep and clear, no cell/flare   Iris irregular dilation, notch at 0700, scattered transillumination defects greatest IT quad Round and moderately dilated   Lens PC IOL in good position, 1+ central Posterior capsular opacification PC IOL in good position   Anterior Vitreous mild syneresis mild syneresis         Fundus Exam       Right Left   Disc Pink and Sharp Pink and Sharp, temporal PPA   C/D Ratio 0.6 0.5   Macula Flat, trace cystic changes -- stably improved, mild ERM, Good foveal reflex, No heme or edema Flat, Blunted foveal reflex, central cystic changes -- improved almost resolved, no heme   Vessels attenuated, Tortuous attenuated, mild tortuosity   Periphery Attached, reticular degeneration,  No heme Attached, focal CR atrophy at 1030, no heme, mild reticular degeneration           IMAGING AND PROCEDURES  Imaging and Procedures for 06/30/2023  OCT, Retina - OU - Both Eyes  Right Eye Quality was good. Central Foveal Thickness: 274. Progression has been stable. Findings include normal foveal contour, no IRF, no SRF (Stable improvement in IRF / cystic changes temporal fovea and mac-- no CME, partial PVD).   Left Eye Quality was good. Central Foveal Thickness: 272. Progression has improved. Findings include normal foveal contour, no SRF, intraretinal fluid (Interval improvement in IRF nasal foveal -- almost resolved, partial PVD).   Notes *Images captured and stored on drive  Diagnosis / Impression:  CME OU (OS >> OD) OD: Stable improvement in IRF / cystic changes temporal fovea and mac-- no CME, partial PVD OS: Interval improvement in IRF nasal foveal -- almost resolved, partial PVD  Clinical management:  See below  Abbreviations: NFP - Normal foveal profile. CME - cystoid macular edema. PED - pigment epithelial detachment. IRF - intraretinal fluid. SRF - subretinal fluid. EZ - ellipsoid zone. ERM - epiretinal membrane. ORA - outer retinal atrophy. ORT - outer retinal tubulation. SRHM - subretinal hyper-reflective material. IRHM - intraretinal hyper-reflective material            ASSESSMENT/PLAN:    ICD-10-CM   1. Cystoid macular edema of both eyes  H35.353 OCT, Retina - OU - Both Eyes    2. Diabetes mellitus type 2 without retinopathy (HCC)  E11.9     3. Essential hypertension  I10     4. Hypertensive retinopathy of both eyes  H35.033     5. Pseudophakia, both eyes  Z96.1      CME OU -- stably resolved OD, recurrent and persistent OS - s/p CEIOL OU, Dr. Zenaida Niece (OD 09.20.23, OS 11.29.23) - no significant improvement on 01.26.24 f/u with Dr. Zenaida Niece and referred here for further evaluation and management - FA 01.30.24 shows perifoveal petaloid leakage and  hyperfluorescence of disc OU -- consistent with CME / [place] - s/p STK OD #1 (02.13.24) - s/p STK OS #1 (01.30.24), #2 (07.16.24)  - diagnosed and started PF and Ketorolac QID OU on 01.12.24  - BCVA 20/25 OU - stable  - was reduced to qdaily OU by Dr. Zenaida Niece on 04.10.24 - OCT shows OD: Stable improvement in IRF / cystic changes temporal fovea and mac-- no CME; OS: Interval improvement in IRF nasal foveal -- almost resolved, partial PVD - cont PF and Ketorolac QID OS - STK informed consent obtained and signed, 01.30.24 (OU) - f/u 6 weeks, DFE, OCT  2. Diabetes mellitus, type 2 without retinopathy  - A1c in the 4-5 range, not on medication - The incidence, risk factors for progression, natural history and treatment options for diabetic retinopathy  were discussed with patient.   - The need for close monitoring of blood glucose, blood pressure, and serum lipids, avoiding cigarette or any type of tobacco, and the need for long term follow up was also discussed with patient. - f/u in 1 year, sooner prn  3,4. Hypertensive retinopathy OU - discussed importance of tight BP control - continue to monitor  5. Pseudophakia OU  - s/p CE/IOL OU, Dr. Zenaida Niece (OD 09.20.23, OS 11.29.23)  - IOLs in good position  - cleared to proceed with a new glasses prescription  - pt has an appointment with Dr. Zenaida Niece 10.11.24   Ophthalmic Meds Ordered this visit:  Meds ordered this encounter  Medications   ketorolac (ACULAR) 0.5 % ophthalmic solution    Sig: Place 1 drop into the left eye 4 (four) times daily.    Dispense:  10 mL    Refill:  6   prednisoLONE acetate (PRED FORTE) 1 % ophthalmic suspension    Sig: Place 1 drop into the left eye 4 (four) times daily.    Dispense:  10 mL    Refill:  6     Return in about 6 weeks (around 08/11/2023) for f/u CME OU, DFE, OCT.  There are no Patient Instructions on file for this visit.   Explained the diagnoses, plan, and follow up with the patient and they  expressed understanding.  Patient expressed understanding of the importance of proper follow up care.   This document serves as a record of services personally performed by Karie Chimera, MD, PhD. It was created on their behalf by Laurey Morale, COT an ophthalmic technician. The creation of this record is the provider's dictation and/or activities during the visit.    Electronically signed by:  Charlette Caffey, COT  07/04/23 2:40 AM  Karie Chimera, M.D., Ph.D. Diseases & Surgery of the Retina and Vitreous [facility]  I have reviewed the above documentation for accuracy and completeness, and I agree with the above. Karie Chimera, M.D., Ph.D. 07/04/23 2:40 AM  Abbreviations: M myopia (nearsighted); A astigmatism; H hyperopia (farsighted); P presbyopia; Mrx spectacle prescription;  CTL contact lenses; OD right eye; OS left eye; OU both eyes  XT exotropia; ET esotropia; PEK punctate epithelial keratitis; PEE punctate epithelial erosions; DES dry eye syndrome; MGD meibomian gland dysfunction; ATs artificial tears; PFAT's preservative free artificial tears; NSC nuclear sclerotic cataract; PSC posterior subcapsular cataract; ERM epi-retinal membrane; PVD posterior vitreous detachment; RD retinal detachment; DM diabetes mellitus; DR diabetic retinopathy; NPDR non-proliferative diabetic retinopathy; PDR proliferative diabetic retinopathy; CSME clinically significant macular edema; DME diabetic macular edema; dbh dot blot hemorrhages; CWS cotton wool spot; POAG primary open angle glaucoma; C/D cup-to-disc ratio; HVF humphrey visual field; GVF goldmann visual field; OCT optical coherence tomography; IOP intraocular pressure; BRVO Branch retinal vein occlusion; CRVO central retinal vein occlusion; CRAO central retinal artery occlusion; BRAO branch retinal artery occlusion; RT retinal tear; SB scleral buckle; PPV pars plana vitrectomy; VH Vitreous hemorrhage; PRP panretinal laser  photocoagulation; IVK intravitreal kenalog; VMT vitreomacular traction; MH Macular hole;  NVD neovascularization of the disc; NVE neovascularization elsewhere; AREDS age related eye disease study; ARMD age related macular degeneration; POAG primary open angle glaucoma; EBMD epithelial/anterior basement membrane dystrophy; ACIOL anterior chamber intraocular lens; IOL intraocular lens; PCIOL posterior chamber intraocular lens; Phaco/IOL phacoemulsification with intraocular lens placement; PRK photorefractive keratectomy; LASIK laser assisted in situ keratomileusis; HTN hypertension; DM diabetes mellitus; COPD chronic obstructive pulmonary disease

## 2023-06-30 ENCOUNTER — Encounter (INDEPENDENT_AMBULATORY_CARE_PROVIDER_SITE_OTHER): Payer: Self-pay | Admitting: Ophthalmology

## 2023-06-30 ENCOUNTER — Ambulatory Visit (INDEPENDENT_AMBULATORY_CARE_PROVIDER_SITE_OTHER): Payer: Medicare Other | Admitting: Ophthalmology

## 2023-06-30 DIAGNOSIS — I10 Essential (primary) hypertension: Secondary | ICD-10-CM

## 2023-06-30 DIAGNOSIS — H35033 Hypertensive retinopathy, bilateral: Secondary | ICD-10-CM

## 2023-06-30 DIAGNOSIS — Z961 Presence of intraocular lens: Secondary | ICD-10-CM | POA: Diagnosis not present

## 2023-06-30 DIAGNOSIS — E119 Type 2 diabetes mellitus without complications: Secondary | ICD-10-CM

## 2023-06-30 DIAGNOSIS — H35353 Cystoid macular degeneration, bilateral: Secondary | ICD-10-CM | POA: Diagnosis not present

## 2023-06-30 MED ORDER — PREDNISOLONE ACETATE 1 % OP SUSP: 1.0000 [drp] | Freq: Four times a day (QID) | OPHTHALMIC | 6 refills | Status: DC

## 2023-06-30 MED ORDER — KETOROLAC TROMETHAMINE 0.5 % OP SOLN: 1.0000 [drp] | Freq: Four times a day (QID) | OPHTHALMIC | 6 refills | Status: DC

## 2023-07-03 DIAGNOSIS — H59033 Cystoid macular edema following cataract surgery, bilateral: Secondary | ICD-10-CM | POA: Diagnosis not present

## 2023-07-03 DIAGNOSIS — H524 Presbyopia: Secondary | ICD-10-CM | POA: Diagnosis not present

## 2023-07-03 DIAGNOSIS — H04123 Dry eye syndrome of bilateral lacrimal glands: Secondary | ICD-10-CM | POA: Diagnosis not present

## 2023-07-03 DIAGNOSIS — H02831 Dermatochalasis of right upper eyelid: Secondary | ICD-10-CM | POA: Diagnosis not present

## 2023-07-03 DIAGNOSIS — H40013 Open angle with borderline findings, low risk, bilateral: Secondary | ICD-10-CM | POA: Diagnosis not present

## 2023-07-04 ENCOUNTER — Encounter (INDEPENDENT_AMBULATORY_CARE_PROVIDER_SITE_OTHER): Payer: Self-pay | Admitting: Ophthalmology

## 2023-07-04 NOTE — Progress Notes (Signed)
[facility] - Clinic Note  06/30/2023     CHIEF COMPLAINT Patient presents for Retina Follow Up   HISTORY OF PRESENT ILLNESS: Walter Baxter is a 82 y.o. male who presents to the clinic today for:   HPI     Retina Follow Up   Patient presents with  Other.  In both eyes.  Severity is moderate.  Duration of 6 weeks.  Since onset it is stable.  I, the attending physician,  performed the HPI with the patient and updated documentation appropriately.        Comments   Patient states vision not good OU. Using Pred Forte and ketorolac qid OS. Doesn't wear glasses for distance.      Last edited by Rennis Chris, MD on 07/04/2023  2:37 AM.     Patient states he can't see well at a distance.    Referring physician: Diona Foley, MD [address] [place],  [place] [address]  HISTORICAL INFORMATION:   Selected notes from the MEDICAL RECORD NUMBER Referred by Dr. Zenaida Niece for CME OU LEE:  Ocular Hx-  s/p CEIOL OD 09.20.23 S/p CEIOL OS 11.29.23 Currently on PF and Ketorolac QID OU -- started 01.12.24 PMH-    CURRENT MEDICATIONS: Current Outpatient Medications (Ophthalmic Drugs)  Medication Sig   ketorolac (ACULAR) 0.5 % ophthalmic solution Place 1 drop into the left eye 4 (four) times daily.   prednisoLONE acetate (PRED FORTE) 1 % ophthalmic suspension Place 1 drop into the left eye 4 (four) times daily.   No current facility-administered medications for this visit. (Ophthalmic Drugs)   Current Outpatient Medications (Other)  Medication Sig   acetaminophen (TYLENOL) 500 MG tablet Take 1,000 mg by mouth every 6 (six) hours as needed for mild pain or headache.   aspirin EC 81 MG tablet Take 81 mg by mouth daily.   clonazePAM (KLONOPIN) 0.5 MG tablet Take 0.5 mg by mouth daily as needed.   famotidine (PEPCID) 20 MG tablet Take 20 mg by mouth 2 (two) times daily.   guaiFENesin (MUCINEX) 600 MG 12 hr tablet Take 600 mg by mouth 2 (two) times daily as  needed for cough or to loosen phlegm.   levothyroxine (SYNTHROID) 25 MCG tablet Take 25 mcg by mouth every morning.   metoprolol tartrate (LOPRESSOR) 50 MG tablet Take 50 mg by mouth 2 (two) times daily.   montelukast (SINGULAIR) 10 MG tablet Take 10 mg by mouth at bedtime.   Multiple Vitamins-Minerals (MULTIVITAMIN WITH MINERALS) tablet Take 1 tablet by mouth daily.   pravastatin (PRAVACHOL) 20 MG tablet Take 20 mg by mouth daily.   promethazine-codeine (PHENERGAN WITH CODEINE) 6.25-10 MG/5ML syrup Take 5 mLs by mouth every 6 (six) hours as needed for cough.   Rivaroxaban 15 & 20 MG TBPK Follow package directions: Take one 15mg  tablet by mouth twice a day. On day 22, switch to one 20mg  tablet once a day. Take with food.   tamsulosin (FLOMAX) 0.4 MG CAPS capsule Take 0.4 mg by mouth daily.   No current facility-administered medications for this visit. (Other)   REVIEW OF SYSTEMS: ROS   Positive for: Endocrine, Cardiovascular, Eyes Negative for: Constitutional, Gastrointestinal, Neurological, Skin, Genitourinary, Musculoskeletal, HENT, Respiratory, Psychiatric, Allergic/Imm, Heme/Lymph Last edited by Annalee Genta D, COT on 06/30/2023  1:08 PM.     ALLERGIES Allergies  Allergen Reactions   Other Rash    Magic mouth wash    PAST MEDICAL HISTORY Past Medical History:  Diagnosis Date  Allergic rhinitis    Angio-edema    Chronic bronchitis (HCC)    Erectile dysfunction    Essential hypertension    Hyperlipidemia    Obesity    Type 2 diabetes mellitus [facility])    Past Surgical History:  Procedure Laterality Date   ADENOIDECTOMY     CATARACT EXTRACTION Bilateral    Oct OD, Nov OS 2023 Dr. Zenaida Niece   TONSILLECTOMY     FAMILY HISTORY Family History  Problem Relation Age of Onset   Lupus Sister    SOCIAL HISTORY Social History   Tobacco Use   Smoking status: Never   Smokeless tobacco: Never  Vaping Use   Vaping status: Never Used  Substance Use Topics   Alcohol use: Not  Currently   Drug use: Never       OPHTHALMIC EXAM:  Base Eye Exam     Visual Acuity (Snellen - Linear)       Right Left   Dist [place] 20/40 20/50 -2   Dist ph [place] 20/25 -1 20/25 -2         Tonometry (Tonopen, 1:16 PM)       Right Left   Pressure 15 18         Pupils       Dark Light Shape React APD   Right 4 4 Irregular Minimal None   Left 4 3 Round Brisk None         Visual Fields (Counting fingers)       Left Right    Full Full         Extraocular Movement       Right Left    Full, Ortho Full, Ortho         Neuro/Psych     Oriented x3: Yes   Mood/Affect: Normal         Dilation     Both eyes: 1.0% Mydriacyl, 2.5% Phenylephrine @ 1:17 PM           Slit Lamp and Fundus Exam     Slit Lamp Exam       Right Left   Lids/Lashes Dermatochalasis - upper lid, Meibomian gland dysfunction, Telangiectasia Dermatochalasis - upper lid, Meibomian gland dysfunction   Conjunctiva/Sclera White and quiet White and quiet, STK ST quad   Cornea tear film debris, mild arcus, well healed cataract wound, 1+ Punctate epithelial erosions, EBMD, find endopigment arcus, well healed cataract wound, tear film debris   Anterior Chamber deep and clear, no cell/flare deep and clear, no cell/flare   Iris irregular dilation, notch at 0700, scattered transillumination defects greatest IT quad Round and moderately dilated   Lens PC IOL in good position, 1+ central Posterior capsular opacification PC IOL in good position   Anterior Vitreous mild syneresis mild syneresis         Fundus Exam       Right Left   Disc Pink and Sharp Pink and Sharp, temporal PPA   C/D Ratio 0.6 0.5   Macula Flat, trace cystic changes -- stably improved, mild ERM, Good foveal reflex, No heme or edema Flat, Blunted foveal reflex, central cystic changes -- improved almost resolved, no heme   Vessels attenuated, Tortuous attenuated, mild tortuosity   Periphery Attached, reticular degeneration,  No heme Attached, focal CR atrophy at 1030, no heme, mild reticular degeneration           IMAGING AND PROCEDURES  Imaging and Procedures for 06/30/2023  OCT, Retina - OU - Both Eyes  Right Eye Quality was good. Central Foveal Thickness: 274. Progression has been stable. Findings include normal foveal contour, no IRF, no SRF (Stable improvement in IRF / cystic changes temporal fovea and mac-- no CME, partial PVD).   Left Eye Quality was good. Central Foveal Thickness: 272. Progression has improved. Findings include normal foveal contour, no SRF, intraretinal fluid (Interval improvement in IRF nasal foveal -- almost resolved, partial PVD).   Notes *Images captured and stored on drive  Diagnosis / Impression:  CME OU (OS >> OD) OD: Stable improvement in IRF / cystic changes temporal fovea and mac-- no CME, partial PVD OS: Interval improvement in IRF nasal foveal -- almost resolved, partial PVD  Clinical management:  See below  Abbreviations: NFP - Normal foveal profile. CME - cystoid macular edema. PED - pigment epithelial detachment. IRF - intraretinal fluid. SRF - subretinal fluid. EZ - ellipsoid zone. ERM - epiretinal membrane. ORA - outer retinal atrophy. ORT - outer retinal tubulation. SRHM - subretinal hyper-reflective material. IRHM - intraretinal hyper-reflective material            ASSESSMENT/PLAN:    ICD-10-CM   1. Cystoid macular edema of both eyes  H35.353 OCT, Retina - OU - Both Eyes    2. Diabetes mellitus type 2 without retinopathy (HCC)  E11.9     3. Essential hypertension  I10     4. Hypertensive retinopathy of both eyes  H35.033     5. Pseudophakia, both eyes  Z96.1      CME OU -- stably resolved OD, recurrent and persistent OS - s/p CEIOL OU, Dr. Zenaida Niece (OD 09.20.23, OS 11.29.23) - no significant improvement on 01.26.24 f/u with Dr. Zenaida Niece and referred here for further evaluation and management - FA 01.30.24 shows perifoveal petaloid leakage and  hyperfluorescence of disc OU -- consistent with CME / [place] - s/p STK OD #1 (02.13.24) - s/p STK OS #1 (01.30.24), #2 (07.16.24)  - diagnosed and started PF and Ketorolac QID OU on 01.12.24  - BCVA 20/25 OU - stable  - was reduced to qdaily OU by Dr. Zenaida Niece on 04.10.24 - OCT shows OD: Stable improvement in IRF / cystic changes temporal fovea and mac-- no CME; OS: Interval improvement in IRF nasal foveal -- almost resolved, partial PVD - cont PF and Ketorolac QID OS - STK informed consent obtained and signed, 01.30.24 (OU) - f/u 6 weeks, DFE, OCT  2. Diabetes mellitus, type 2 without retinopathy  - A1c in the 4-5 range, not on medication - The incidence, risk factors for progression, natural history and treatment options for diabetic retinopathy  were discussed with patient.   - The need for close monitoring of blood glucose, blood pressure, and serum lipids, avoiding cigarette or any type of tobacco, and the need for long term follow up was also discussed with patient. - f/u in 1 year, sooner prn  3,4. Hypertensive retinopathy OU - discussed importance of tight BP control - continue to monitor  5. Pseudophakia OU  - s/p CE/IOL OU, Dr. Zenaida Niece (OD 09.20.23, OS 11.29.23)  - IOLs in good position  - cleared to proceed with a new glasses prescription  - pt has an appointment with Dr. Zenaida Niece 10.11.24   Ophthalmic Meds Ordered this visit:  Meds ordered this encounter  Medications   ketorolac (ACULAR) 0.5 % ophthalmic solution    Sig: Place 1 drop into the left eye 4 (four) times daily.    Dispense:  10 mL    Refill:  6   prednisoLONE acetate (PRED FORTE) 1 % ophthalmic suspension    Sig: Place 1 drop into the left eye 4 (four) times daily.    Dispense:  10 mL    Refill:  6     Return in about 6 weeks (around 08/11/2023) for f/u CME OU, DFE, OCT.  There are no Patient Instructions on file for this visit.   Explained the diagnoses, plan, and follow up with the patient and they  expressed understanding.  Patient expressed understanding of the importance of proper follow up care.   This document serves as a record of services personally performed by Karie Chimera, MD, PhD. It was created on their behalf by Laurey Morale, COT an ophthalmic technician. The creation of this record is the provider's dictation and/or activities during the visit.    Electronically signed by:  Charlette Caffey, COT  07/04/23 2:41 AM  Karie Chimera, M.D., Ph.D. Diseases & Surgery of the Retina and Vitreous [facility]  I have reviewed the above documentation for accuracy and completeness, and I agree with the above. Karie Chimera, M.D., Ph.D. 07/04/23 2:41 AM  Abbreviations: M myopia (nearsighted); A astigmatism; H hyperopia (farsighted); P presbyopia; Mrx spectacle prescription;  CTL contact lenses; OD right eye; OS left eye; OU both eyes  XT exotropia; ET esotropia; PEK punctate epithelial keratitis; PEE punctate epithelial erosions; DES dry eye syndrome; MGD meibomian gland dysfunction; ATs artificial tears; PFAT's preservative free artificial tears; NSC nuclear sclerotic cataract; PSC posterior subcapsular cataract; ERM epi-retinal membrane; PVD posterior vitreous detachment; RD retinal detachment; DM diabetes mellitus; DR diabetic retinopathy; NPDR non-proliferative diabetic retinopathy; PDR proliferative diabetic retinopathy; CSME clinically significant macular edema; DME diabetic macular edema; dbh dot blot hemorrhages; CWS cotton wool spot; POAG primary open angle glaucoma; C/D cup-to-disc ratio; HVF humphrey visual field; GVF goldmann visual field; OCT optical coherence tomography; IOP intraocular pressure; BRVO Branch retinal vein occlusion; CRVO central retinal vein occlusion; CRAO central retinal artery occlusion; BRAO branch retinal artery occlusion; RT retinal tear; SB scleral buckle; PPV pars plana vitrectomy; VH Vitreous hemorrhage; PRP panretinal laser  photocoagulation; IVK intravitreal kenalog; VMT vitreomacular traction; MH Macular hole;  NVD neovascularization of the disc; NVE neovascularization elsewhere; AREDS age related eye disease study; ARMD age related macular degeneration; POAG primary open angle glaucoma; EBMD epithelial/anterior basement membrane dystrophy; ACIOL anterior chamber intraocular lens; IOL intraocular lens; PCIOL posterior chamber intraocular lens; Phaco/IOL phacoemulsification with intraocular lens placement; PRK photorefractive keratectomy; LASIK laser assisted in situ keratomileusis; HTN hypertension; DM diabetes mellitus; COPD chronic obstructive pulmonary disease

## 2023-07-28 NOTE — Progress Notes (Signed)
[facility] - Clinic Note  08/11/2023     CHIEF COMPLAINT Patient presents for Retina Follow Up   HISTORY OF PRESENT ILLNESS: Walter Baxter is a 82 y.o. male who presents to the clinic today for:   HPI     Retina Follow Up   Patient presents with  Other.  In both eyes.  Severity is moderate.  Duration of 6 weeks.  Since onset it is stable.  I, the attending physician,  performed the HPI with the patient and updated documentation appropriately.        Comments   Patient states the vision is the same. He is using Ketorolac OS QID, Pred OS QID, and AT's.      Last edited by Rennis Chris, MD on 08/11/2023  4:51 PM.    Patient feels like his right eye is blurrier than the left, he states he is using PF and Ketorolac at least 3x a day, but may miss a drop here and there in the middle of the day, he is also using Refresh Omega 3   Referring physician: Diona Foley, MD [address] [place],  [place] [address]  HISTORICAL INFORMATION:   Selected notes from the MEDICAL RECORD NUMBER Referred by Dr. Zenaida Niece for CME OU LEE:  Ocular Hx-  s/p CEIOL OD 09.20.23 S/p CEIOL OS 11.29.23 Currently on PF and Ketorolac QID OU -- started 01.12.24 PMH-    CURRENT MEDICATIONS: Current Outpatient Medications (Ophthalmic Drugs)  Medication Sig   ketorolac (ACULAR) 0.5 % ophthalmic solution Place 1 drop into the left eye 4 (four) times daily.   prednisoLONE acetate (PRED FORTE) 1 % ophthalmic suspension Place 1 drop into the left eye 4 (four) times daily.   No current facility-administered medications for this visit. (Ophthalmic Drugs)   Current Outpatient Medications (Other)  Medication Sig   acetaminophen (TYLENOL) 500 MG tablet Take 1,000 mg by mouth every 6 (six) hours as needed for mild pain or headache.   aspirin EC 81 MG tablet Take 81 mg by mouth daily.   clonazePAM (KLONOPIN) 0.5 MG tablet Take 0.5 mg by mouth daily as needed.   famotidine (PEPCID)  20 MG tablet Take 20 mg by mouth 2 (two) times daily.   guaiFENesin (MUCINEX) 600 MG 12 hr tablet Take 600 mg by mouth 2 (two) times daily as needed for cough or to loosen phlegm.   levothyroxine (SYNTHROID) 25 MCG tablet Take 25 mcg by mouth every morning.   metoprolol tartrate (LOPRESSOR) 50 MG tablet Take 50 mg by mouth 2 (two) times daily.   montelukast (SINGULAIR) 10 MG tablet Take 10 mg by mouth at bedtime.   Multiple Vitamins-Minerals (MULTIVITAMIN WITH MINERALS) tablet Take 1 tablet by mouth daily.   pravastatin (PRAVACHOL) 20 MG tablet Take 20 mg by mouth daily.   promethazine-codeine (PHENERGAN WITH CODEINE) 6.25-10 MG/5ML syrup Take 5 mLs by mouth every 6 (six) hours as needed for cough.   Rivaroxaban 15 & 20 MG TBPK Follow package directions: Take one 15mg  tablet by mouth twice a day. On day 22, switch to one 20mg  tablet once a day. Take with food.   tamsulosin (FLOMAX) 0.4 MG CAPS capsule Take 0.4 mg by mouth daily.   No current facility-administered medications for this visit. (Other)   REVIEW OF SYSTEMS: ROS   Positive for: Endocrine, Cardiovascular, Eyes Negative for: Constitutional, Gastrointestinal, Neurological, Skin, Genitourinary, Musculoskeletal, HENT, Respiratory, Psychiatric, Allergic/Imm, Heme/Lymph Last edited by Charlette Caffey, COT on 08/11/2023  12:53 PM.     ALLERGIES Allergies  Allergen Reactions   Other Rash    Magic mouth wash    PAST MEDICAL HISTORY Past Medical History:  Diagnosis Date   Allergic rhinitis    Angio-edema    Chronic bronchitis (HCC)    Erectile dysfunction    Essential hypertension    Hyperlipidemia    Obesity    Type 2 diabetes mellitus (HCC)    Past Surgical History:  Procedure Laterality Date   ADENOIDECTOMY     CATARACT EXTRACTION Bilateral    Oct OD, Nov OS 2023 Dr. Zenaida Niece   TONSILLECTOMY     FAMILY HISTORY Family History  Problem Relation Age of Onset   Lupus Sister    SOCIAL HISTORY Social History   Tobacco  Use   Smoking status: Never   Smokeless tobacco: Never  Vaping Use   Vaping status: Never Used  Substance Use Topics   Alcohol use: Not Currently   Drug use: Never       OPHTHALMIC EXAM:  Base Eye Exam     Visual Acuity (Snellen - Linear)       Right Left   Dist cc 20/25 20/25   Dist ph cc NI NI         Tonometry (Tonopen, 12:56 PM)       Right Left   Pressure 16 18         Pupils       Dark Light Shape React APD   Right 4 4 Irregular Minimal None   Left 4 3 Round Brisk None         Visual Fields       Left Right    Full Full         Extraocular Movement       Right Left    Full Full         Neuro/Psych     Oriented x3: Yes   Mood/Affect: Normal         Dilation     Both eyes: 1.0% Mydriacyl, 2.5% Phenylephrine @ 12:53 PM           Slit Lamp and Fundus Exam     Slit Lamp Exam       Right Left   Lids/Lashes Dermatochalasis - upper lid Dermatochalasis - upper lid, Meibomian gland dysfunction, Telangiectasia, Chalazion: upper lid   Conjunctiva/Sclera White and quiet White and quiet, STK -- gone   Cornea tear film debris, mild arcus, well healed cataract wound, 1+ Punctate epithelial erosions, EBMD, find endo pigment arcus, well healed cataract wound, tear film debris   Anterior Chamber deep and clear, no cell/flare deep and clear, no cell/flare   Iris irregular dilation, notch at 0700, scattered transillumination defects greatest IT quad Round and moderately dilated   Lens PC IOL in good position with open PC PC IOL in good position   Anterior Vitreous mild syneresis mild syneresis         Fundus Exam       Right Left   Disc Pink and Sharp Pink and Sharp, temporal PPA   C/D Ratio 0.6 0.5   Macula Flat, good foveal reflex, trace cystic changes -- stably improved, mild ERM, No heme or edema Flat, Blunted foveal reflex, central cystic changes --  increased, no heme   Vessels attenuated, Tortuous attenuated, Tortuous    Periphery Attached, reticular degeneration, No heme Attached, focal CR atrophy at 1030, no heme, mild reticular degeneration  IMAGING AND PROCEDURES  Imaging and Procedures for 08/11/2023  OCT, Retina - OU - Both Eyes       Right Eye Quality was good. Central Foveal Thickness: 278. Progression has been stable. Findings include normal foveal contour, no IRF, no SRF (Stable improvement in IRF / cystic changes temporal fovea and mac-- no CME, partial PVD).   Left Eye Quality was good. Central Foveal Thickness: 289. Progression has worsened. Findings include normal foveal contour, no SRF, intraretinal fluid (Interval increase in IRF nasal foveal, partial PVD).   Notes *Images captured and stored on drive  Diagnosis / Impression:  CME OU (OS >> OD) OD: Stable improvement in IRF / cystic changes temporal fovea and mac-- no CME, partial PVD OS: Interval increase in IRF nasal foveal, partial PVD  Clinical management:  See below  Abbreviations: NFP - Normal foveal profile. CME - cystoid macular edema. PED - pigment epithelial detachment. IRF - intraretinal fluid. SRF - subretinal fluid. EZ - ellipsoid zone. ERM - epiretinal membrane. ORA - outer retinal atrophy. ORT - outer retinal tubulation. SRHM - subretinal hyper-reflective material. IRHM - intraretinal hyper-reflective material      Injection into Tenon's Capsule - OS - Left Eye       Time Out 08/11/2023. 1:36 PM. Confirmed correct patient, procedure, site, and patient consented.   Anesthesia Topical anesthesia was used. Anesthetic medications included Lidocaine 2%, Proparacaine 0.5%.   Procedure Preparation included 5% betadine to ocular surface, eyelid speculum. A (25g) needle was used.   Injection: 40 mg triamcinolone acetonide 40 MG/ML   Route: Other, Site: Left Eye   NDC: [id number], Lot: [id number], Expiration date: 05/22/2024   Post-op Post injection exam found visual acuity of at least counting  fingers. The patient tolerated the procedure well. There were no complications. The patient received written and verbal post procedure care education. Post injection medications were not given.   Notes 1.0 cc of Kenalog-40 (40 mg) injected into subtenon's capsule in the superotemporal quadrant. Betadine was applied to Injection area pre and post-injection then rinsed with sterile BSS. 1 drop of ofloxacin was instilled into the eye. There were no complications. Pt tolerated procedure well.           ASSESSMENT/PLAN:    ICD-10-CM   1. Cystoid macular edema of both eyes  H35.353 OCT, Retina - OU - Both Eyes    Injection into Tenon's Capsule - OS - Left Eye    triamcinolone acetonide (KENALOG-40) injection 40 mg    2. Diabetes mellitus type 2 without retinopathy (HCC)  E11.9     3. Essential hypertension  I10     4. Hypertensive retinopathy of both eyes  H35.033     5. Pseudophakia, both eyes  Z96.1      CME OU -- stably resolved OD, recurrent and persistent OS - s/p CEIOL OU, Dr. Zenaida Niece (OD 09.20.23, OS 11.29.23) - no significant improvement on 01.26.24 f/u with Dr. Zenaida Niece and referred here for further evaluation and management - FA 01.30.24 shows perifoveal petaloid leakage and hyperfluorescence of disc OU -- consistent with CME / [place] - s/p STK OD #1 (02.13.24) - s/p STK OS #1 (01.30.24), #2 (07.16.24)  - diagnosed and started PF and Ketorolac QID OU on 01.12.24  - BCVA 20/25 OU - stable  - was reduced to qdaily OU by Dr. Zenaida Niece on 04.10.24 - OCT shows OD: Stable improvement in IRF / cystic changes temporal fovea and mac-- no CME; OS: Interval increase in IRF nasal foveal,  partial PVD - recommend STK OS #3 today, 11.19.24 for worsening CME OS - RBA of procedure discussed, questions answered - STK informed consent obtained and signed, 01.30.24 (OU) - see procedure note  - cont PF and Ketorolac QID OS - f/u 6 weeks, DFE, OCT  2. Diabetes mellitus, type 2 without retinopathy  -  A1c in the 4-5 range, not on medication - The incidence, risk factors for progression, natural history and treatment options for diabetic retinopathy  were discussed with patient.   - The need for close monitoring of blood glucose, blood pressure, and serum lipids, avoiding cigarette or any type of tobacco, and the need for long term follow up was also discussed with patient. - f/u in 1 year, sooner prn  3,4. Hypertensive retinopathy OU - discussed importance of tight BP control - continue to monitor  5. Pseudophakia OU  - s/p CE/IOL OU, Dr. Zenaida Niece (OD 09.20.23, OS 11.29.23)  - s/p Yag Cap OD (11.14.24 -- Dr. Zenaida Niece)  - IOLs in good position  - cleared to proceed with a new glasses prescription  - monitor   Ophthalmic Meds Ordered this visit:  Meds ordered this encounter  Medications   triamcinolone acetonide (KENALOG-40) injection 40 mg     Return in about 6 weeks (around 09/22/2023) for f/u CME OS, DFE, OCT.  There are no Patient Instructions on file for this visit.   Explained the diagnoses, plan, and follow up with the patient and they expressed understanding.  Patient expressed understanding of the importance of proper follow up care.   This document serves as a record of services personally performed by Karie Chimera, MD, PhD. It was created on their behalf by Laurey Morale, COT an ophthalmic technician. The creation of this record is the provider's dictation and/or activities during the visit.    Electronically signed by:  Charlette Caffey, COT  08/11/23 4:52 PM  This document serves as a record of services personally performed by Karie Chimera, MD, PhD. It was created on their behalf by Glee Arvin. Manson Passey, OA an ophthalmic technician. The creation of this record is the provider's dictation and/or activities during the visit.    Electronically signed by: Glee Arvin. Manson Passey, OA 08/11/23 4:52 PM  Karie Chimera, M.D., Ph.D. Diseases & Surgery of the Retina and  Vitreous [facility]  I have reviewed the above documentation for accuracy and completeness, and I agree with the above. Karie Chimera, M.D., Ph.D. 08/11/23 4:54 PM   Abbreviations: M myopia (nearsighted); A astigmatism; H hyperopia (farsighted); P presbyopia; Mrx spectacle prescription;  CTL contact lenses; OD right eye; OS left eye; OU both eyes  XT exotropia; ET esotropia; PEK punctate epithelial keratitis; PEE punctate epithelial erosions; DES dry eye syndrome; MGD meibomian gland dysfunction; ATs artificial tears; PFAT's preservative free artificial tears; NSC nuclear sclerotic cataract; PSC posterior subcapsular cataract; ERM epi-retinal membrane; PVD posterior vitreous detachment; RD retinal detachment; DM diabetes mellitus; DR diabetic retinopathy; NPDR non-proliferative diabetic retinopathy; PDR proliferative diabetic retinopathy; CSME clinically significant macular edema; DME diabetic macular edema; dbh dot blot hemorrhages; CWS cotton wool spot; POAG primary open angle glaucoma; C/D cup-to-disc ratio; HVF humphrey visual field; GVF goldmann visual field; OCT optical coherence tomography; IOP intraocular pressure; BRVO Branch retinal vein occlusion; CRVO central retinal vein occlusion; CRAO central retinal artery occlusion; BRAO branch retinal artery occlusion; RT retinal tear; SB scleral buckle; PPV pars plana vitrectomy; VH Vitreous hemorrhage; PRP panretinal laser photocoagulation; IVK intravitreal  kenalog; VMT vitreomacular traction; MH Macular hole;  NVD neovascularization of the disc; NVE neovascularization elsewhere; AREDS age related eye disease study; ARMD age related macular degeneration; POAG primary open angle glaucoma; EBMD epithelial/anterior basement membrane dystrophy; ACIOL anterior chamber intraocular lens; IOL intraocular lens; PCIOL posterior chamber intraocular lens; Phaco/IOL phacoemulsification with intraocular lens placement; PRK photorefractive  keratectomy; LASIK laser assisted in situ keratomileusis; HTN hypertension; DM diabetes mellitus; COPD chronic obstructive pulmonary disease

## 2023-08-06 DIAGNOSIS — H04123 Dry eye syndrome of bilateral lacrimal glands: Secondary | ICD-10-CM | POA: Diagnosis not present

## 2023-08-06 DIAGNOSIS — H26493 Other secondary cataract, bilateral: Secondary | ICD-10-CM | POA: Diagnosis not present

## 2023-08-06 DIAGNOSIS — H16223 Keratoconjunctivitis sicca, not specified as Sjogren's, bilateral: Secondary | ICD-10-CM | POA: Diagnosis not present

## 2023-08-06 DIAGNOSIS — H59033 Cystoid macular edema following cataract surgery, bilateral: Secondary | ICD-10-CM | POA: Diagnosis not present

## 2023-08-11 ENCOUNTER — Ambulatory Visit (INDEPENDENT_AMBULATORY_CARE_PROVIDER_SITE_OTHER): Payer: Medicare Other | Admitting: Ophthalmology

## 2023-08-11 ENCOUNTER — Encounter (INDEPENDENT_AMBULATORY_CARE_PROVIDER_SITE_OTHER): Payer: Self-pay | Admitting: Ophthalmology

## 2023-08-11 DIAGNOSIS — I10 Essential (primary) hypertension: Secondary | ICD-10-CM | POA: Diagnosis not present

## 2023-08-11 DIAGNOSIS — Z961 Presence of intraocular lens: Secondary | ICD-10-CM

## 2023-08-11 DIAGNOSIS — H35033 Hypertensive retinopathy, bilateral: Secondary | ICD-10-CM | POA: Diagnosis not present

## 2023-08-11 DIAGNOSIS — H35353 Cystoid macular degeneration, bilateral: Secondary | ICD-10-CM | POA: Diagnosis not present

## 2023-08-11 DIAGNOSIS — E119 Type 2 diabetes mellitus without complications: Secondary | ICD-10-CM

## 2023-08-11 MED ORDER — TRIAMCINOLONE ACETONIDE 40 MG/ML IJ SUSP FOR KALEIDOSCOPE
40.0000 mg | INTRAMUSCULAR | Status: AC | PRN
Start: 2023-08-11 — End: 2023-08-11
  Administered 2023-08-11: 40 mg

## 2023-09-07 DIAGNOSIS — H26492 Other secondary cataract, left eye: Secondary | ICD-10-CM | POA: Diagnosis not present

## 2023-09-07 DIAGNOSIS — Z961 Presence of intraocular lens: Secondary | ICD-10-CM | POA: Diagnosis not present

## 2023-09-07 DIAGNOSIS — H00024 Hordeolum internum left upper eyelid: Secondary | ICD-10-CM | POA: Diagnosis not present

## 2023-09-07 DIAGNOSIS — H04123 Dry eye syndrome of bilateral lacrimal glands: Secondary | ICD-10-CM | POA: Diagnosis not present

## 2023-09-10 NOTE — Progress Notes (Signed)
 [facility] - Clinic Note  09/22/2023     CHIEF COMPLAINT Patient presents for Retina Follow Up   HISTORY OF PRESENT ILLNESS: Walter Baxter is a 82 y.o. male who presents to the clinic today for:   HPI     Retina Follow Up   Patient presents with  Other.  In both eyes.  Severity is moderate.  Duration of 6 weeks.  Since onset it is stable.  I, the attending physician,  performed the HPI with the patient and updated documentation appropriately.        Comments   Patient states the vision varies. He saw Dr. Fleeta today and she gave him a shot. His blood sugar was 117. He is using Ketorolac  OS TID and Pred OU TID.      Last edited by Valdemar Rogue, MD on 09/23/2023  1:07 AM.     Patient feels the vision is the same.   Referring physician: [name], MD [address] [place],  [place] [address]  HISTORICAL INFORMATION:   Selected notes from the MEDICAL RECORD NUMBER Referred by Dr. Fleeta for CME OU LEE:  Ocular Hx-  s/p CEIOL OD 09.20.23 S/p CEIOL OS 11.29.23 Currently on PF and Ketorolac  QID OU -- started 01.12.24 PMH-    CURRENT MEDICATIONS: Current Outpatient Medications (Ophthalmic Drugs)  Medication Sig   ketorolac  (ACULAR ) 0.5 % ophthalmic solution Place 1 drop into the left eye 4 (four) times daily.   prednisoLONE  acetate (PRED FORTE ) 1 % ophthalmic suspension Place 1 drop into the left eye 4 (four) times daily.   No current facility-administered medications for this visit. (Ophthalmic Drugs)   Current Outpatient Medications (Other)  Medication Sig   acetaminophen  (TYLENOL ) 500 MG tablet Take 1,000 mg by mouth every 6 (six) hours as needed for mild pain or headache.   aspirin EC 81 MG tablet Take 81 mg by mouth daily.   clonazePAM (KLONOPIN) 0.5 MG tablet Take 0.5 mg by mouth daily as needed.   famotidine  (PEPCID ) 20 MG tablet Take 20 mg by mouth 2 (two) times daily.   guaiFENesin  (MUCINEX ) 600 MG 12 hr tablet Take 600 mg by  mouth 2 (two) times daily as needed for cough or to loosen phlegm.   levothyroxine (SYNTHROID) 25 MCG tablet Take 25 mcg by mouth every morning.   metoprolol  tartrate (LOPRESSOR ) 50 MG tablet Take 50 mg by mouth 2 (two) times daily.   montelukast  (SINGULAIR ) 10 MG tablet Take 10 mg by mouth at bedtime.   Multiple Vitamins-Minerals (MULTIVITAMIN WITH MINERALS) tablet Take 1 tablet by mouth daily.   pravastatin  (PRAVACHOL ) 20 MG tablet Take 20 mg by mouth daily.   promethazine-codeine (PHENERGAN WITH CODEINE) 6.25-10 MG/5ML syrup Take 5 mLs by mouth every 6 (six) hours as needed for cough.   Rivaroxaban  15 & 20 MG TBPK Follow package directions: Take one 15mg  tablet by mouth twice a day. On day 22, switch to one 20mg  tablet once a day. Take with food.   tamsulosin  (FLOMAX ) 0.4 MG CAPS capsule Take 0.4 mg by mouth daily.   No current facility-administered medications for this visit. (Other)   REVIEW OF SYSTEMS: ROS   Positive for: Endocrine, Cardiovascular, Eyes Negative for: Constitutional, Gastrointestinal, Neurological, Skin, Genitourinary, Musculoskeletal, HENT, Respiratory, Psychiatric, Allergic/Imm, Heme/Lymph Last edited by Myra Wanda SAILOR, COT on 09/22/2023 12:47 PM.      ALLERGIES Allergies  Allergen Reactions   Other Rash    Magic mouth wash  PAST MEDICAL HISTORY Past Medical History:  Diagnosis Date   Allergic rhinitis    Angio-edema    Chronic bronchitis (HCC)    Erectile dysfunction    Essential hypertension    Hyperlipidemia    Obesity    Type 2 diabetes mellitus (HCC)    Past Surgical History:  Procedure Laterality Date   ADENOIDECTOMY     CATARACT EXTRACTION Bilateral    Oct OD, Nov OS 2023 Dr. Fleeta   TONSILLECTOMY     FAMILY HISTORY Family History  Problem Relation Age of Onset   Lupus Sister    SOCIAL HISTORY Social History   Tobacco Use   Smoking status: Never   Smokeless tobacco: Never  Vaping Use   Vaping status: Never Used  Substance  Use Topics   Alcohol use: Not Currently   Drug use: Never       OPHTHALMIC EXAM:  Base Eye Exam     Visual Acuity (Snellen - Linear)       Right Left   Dist cc 20/25 +2 20/25 +2   Dist ph cc NI NI    Correction: Glasses         Tonometry (Tonopen, 12:51 PM)       Right Left   Pressure 11 13         Pupils       Dark Light Shape React APD   Right 4 4 Irregular Minimal None   Left 4 3 Round Brisk None         Visual Fields       Left Right    Full Full         Extraocular Movement       Right Left    Full, Ortho Full, Ortho         Neuro/Psych     Oriented x3: Yes   Mood/Affect: Normal         Dilation     Both eyes: 1.0% Mydriacyl, 2.5% Phenylephrine @ 12:48 PM           Slit Lamp and Fundus Exam     Slit Lamp Exam       Right Left   Lids/Lashes Dermatochalasis - upper lid Dermatochalasis - upper lid, Meibomian gland dysfunction, Telangiectasia, Chalazion: upper lid   Conjunctiva/Sclera White and quiet White and quiet, STK STquad   Cornea tear film debris, mild arcus, well healed cataract wound, 1+ Punctate epithelial erosions, EBMD, find endo pigment arcus, well healed cataract wound, tear film debris   Anterior Chamber deep and clear, no cell/flare deep and clear, no cell/flare   Iris irregular dilation, notch at 0700, scattered transillumination defects greatest IT quad Round and moderately dilated   Lens PC IOL in good position with open PC PC IOL in good position   Anterior Vitreous mild syneresis mild syneresis         Fundus Exam       Right Left   Disc Pink and Sharp Pink and Sharp, temporal PPA   C/D Ratio 0.6 0.5   Macula Flat, good foveal reflex, trace cystic changes -- stably improved, mild ERM, No heme or edema Flat, Blunted foveal reflex, central cystic changes --  improved, mild ERM, no heme   Vessels attenuated, Tortuous attenuated, Tortuous   Periphery Attached, reticular degeneration, No heme Attached,  focal CR atrophy at 1030, no heme, mild reticular degeneration           IMAGING AND PROCEDURES  Imaging and  Procedures for 09/22/2023  OCT, Retina - OU - Both Eyes       Right Eye Quality was good. Central Foveal Thickness: 276. Progression has been stable. Findings include normal foveal contour, no IRF, no SRF (Stable improvement in IRF / cystic changes temporal fovea and mac-- no CME, partial PVD).   Left Eye Quality was good. Central Foveal Thickness: 268. Progression has improved. Findings include normal foveal contour, no IRF, no SRF, intraretinal fluid (Interval improvement in IRF nasal fovea--no CME, partial PVD).   Notes *Images captured and stored on drive  Diagnosis / Impression:  CME OU (OS >> OD) OD: Stable improvement in IRF / cystic changes temporal fovea and mac-- no CME, partial PVD OS: Interval improvement in IRF nasal fovea--no CME, partial PVD  Clinical management:  See below  Abbreviations: NFP - Normal foveal profile. CME - cystoid macular edema. PED - pigment epithelial detachment. IRF - intraretinal fluid. SRF - subretinal fluid. EZ - ellipsoid zone. ERM - epiretinal membrane. ORA - outer retinal atrophy. ORT - outer retinal tubulation. SRHM - subretinal hyper-reflective material. IRHM - intraretinal hyper-reflective material            ASSESSMENT/PLAN:    ICD-10-CM   1. Cystoid macular edema of both eyes  H35.353 OCT, Retina - OU - Both Eyes    2. Diabetes mellitus type 2 without retinopathy (HCC)  E11.9     3. Essential hypertension  I10     4. Hypertensive retinopathy of both eyes  H35.033     5. Pseudophakia, both eyes  Z96.1      CME OU -- stably resolved OD, improved OS - s/p CEIOL OU, Dr. Fleeta (OD 09.20.23, OS 11.29.23) - no significant improvement on 01.26.24 f/u with Dr. Fleeta and referred here for further evaluation and management - FA 01.30.24 shows perifoveal petaloid leakage and hyperfluorescence of disc OU -- consistent with  CME / [place] - diagnosed and started PF and Ketorolac  QID OU on 01.12.24 - s/p STK OD #1 (02.13.24) - s/p STK OS #1 (01.30.24), #2 (07.16.24), #3 (11.19.24)  - BCVA 20/25 OU - stable - OCT shows OD: Stable improvement in IRF / cystic changes temporal fovea and mac-- no CME; OS: Interval improvement in IRF nasal fovea--no CME, partial PVD at 6 wks post STK OS - cont PF and Ketorolac  TID OS - f/u 6 weeks, DFE, OCT  2. Diabetes mellitus, type 2 without retinopathy  - A1c in the 4-5 range, not on medication - The incidence, risk factors for progression, natural history and treatment options for diabetic retinopathy  were discussed with patient.   - The need for close monitoring of blood glucose, blood pressure, and serum lipids, avoiding cigarette or any type of tobacco, and the need for long term follow up was also discussed with patient. - f/u in 1 year, sooner prn  3,4. Hypertensive retinopathy OU - discussed importance of tight BP control - continue to monitor  5. Pseudophakia OU  - s/p CE/IOL OU, Dr. Fleeta (OD 09.20.23, OS 11.29.23)  - s/p Yag Cap OD (11.14.24 -- Dr. Fleeta)  - IOLs in good position  - cleared to proceed with a new glasses prescription  - monitor  Ophthalmic Meds Ordered this visit:  No orders of the defined types were placed in this encounter.    Return in about 6 weeks (around 11/03/2023) for f/u CME OU , DFE, OCT.  There are no Patient Instructions on file for this visit.   Explained  the diagnoses, plan, and follow up with the patient and they expressed understanding.  Patient expressed understanding of the importance of proper follow up care.   This document serves as a record of services personally performed by Redell JUDITHANN Hans, MD, PhD. It was created on their behalf by Wanda Keens, COT an ophthalmic technician. The creation of this record is the provider's dictation and/or activities during the visit.    Electronically signed by:  Wanda GEANNIE Keens, COT  09/23/23 1:08 AM  Redell JUDITHANN Hans, M.D., Ph.D. Diseases & Surgery of the Retina and Vitreous [facility]  I have reviewed the above documentation for accuracy and completeness, and I agree with the above. Redell JUDITHANN Hans, M.D., Ph.D. 09/23/23 1:10 AM   Abbreviations: M myopia (nearsighted); A astigmatism; H hyperopia (farsighted); P presbyopia; Mrx spectacle prescription;  CTL contact lenses; OD right eye; OS left eye; OU both eyes  XT exotropia; ET esotropia; PEK punctate epithelial keratitis; PEE punctate epithelial erosions; DES dry eye syndrome; MGD meibomian gland dysfunction; ATs artificial tears; PFAT's preservative free artificial tears; NSC nuclear sclerotic cataract; PSC posterior subcapsular cataract; ERM epi-retinal membrane; PVD posterior vitreous detachment; RD retinal detachment; DM diabetes mellitus; DR diabetic retinopathy; NPDR non-proliferative diabetic retinopathy; PDR proliferative diabetic retinopathy; CSME clinically significant macular edema; DME diabetic macular edema; dbh dot blot hemorrhages; CWS cotton wool spot; POAG primary open angle glaucoma; C/D cup-to-disc ratio; HVF humphrey visual field; GVF goldmann visual field; OCT optical coherence tomography; IOP intraocular pressure; BRVO Branch retinal vein occlusion; CRVO central retinal vein occlusion; CRAO central retinal artery occlusion; BRAO branch retinal artery occlusion; RT retinal tear; SB scleral buckle; PPV pars plana vitrectomy; VH Vitreous hemorrhage; PRP panretinal laser photocoagulation; IVK intravitreal kenalog ; VMT vitreomacular traction; MH Macular hole;  NVD neovascularization of the disc; NVE neovascularization elsewhere; AREDS age related eye disease study; ARMD age related macular degeneration; POAG primary open angle glaucoma; EBMD epithelial/anterior basement membrane dystrophy; ACIOL anterior chamber intraocular lens; IOL intraocular lens; PCIOL posterior chamber  intraocular lens; Phaco/IOL phacoemulsification with intraocular lens placement; PRK photorefractive keratectomy; LASIK laser assisted in situ keratomileusis; HTN hypertension; DM diabetes mellitus; COPD chronic obstructive pulmonary disease

## 2023-09-22 ENCOUNTER — Encounter (INDEPENDENT_AMBULATORY_CARE_PROVIDER_SITE_OTHER): Payer: Self-pay | Admitting: Ophthalmology

## 2023-09-22 ENCOUNTER — Ambulatory Visit (INDEPENDENT_AMBULATORY_CARE_PROVIDER_SITE_OTHER): Payer: Medicare Other | Admitting: Ophthalmology

## 2023-09-22 DIAGNOSIS — Z961 Presence of intraocular lens: Secondary | ICD-10-CM

## 2023-09-22 DIAGNOSIS — H35033 Hypertensive retinopathy, bilateral: Secondary | ICD-10-CM | POA: Diagnosis not present

## 2023-09-22 DIAGNOSIS — H35353 Cystoid macular degeneration, bilateral: Secondary | ICD-10-CM

## 2023-09-22 DIAGNOSIS — H04123 Dry eye syndrome of bilateral lacrimal glands: Secondary | ICD-10-CM | POA: Diagnosis not present

## 2023-09-22 DIAGNOSIS — E119 Type 2 diabetes mellitus without complications: Secondary | ICD-10-CM

## 2023-09-22 DIAGNOSIS — H00024 Hordeolum internum left upper eyelid: Secondary | ICD-10-CM | POA: Diagnosis not present

## 2023-09-22 DIAGNOSIS — I10 Essential (primary) hypertension: Secondary | ICD-10-CM

## 2023-09-23 ENCOUNTER — Encounter (INDEPENDENT_AMBULATORY_CARE_PROVIDER_SITE_OTHER): Payer: Self-pay | Admitting: Ophthalmology

## 2023-10-19 DIAGNOSIS — H16223 Keratoconjunctivitis sicca, not specified as Sjogren's, bilateral: Secondary | ICD-10-CM | POA: Diagnosis not present

## 2023-10-19 DIAGNOSIS — E782 Mixed hyperlipidemia: Secondary | ICD-10-CM | POA: Diagnosis not present

## 2023-10-19 DIAGNOSIS — H18833 Recurrent erosion of cornea, bilateral: Secondary | ICD-10-CM | POA: Diagnosis not present

## 2023-10-19 DIAGNOSIS — E039 Hypothyroidism, unspecified: Secondary | ICD-10-CM | POA: Diagnosis not present

## 2023-10-19 DIAGNOSIS — E1169 Type 2 diabetes mellitus with other specified complication: Secondary | ICD-10-CM | POA: Diagnosis not present

## 2023-10-19 DIAGNOSIS — H04123 Dry eye syndrome of bilateral lacrimal glands: Secondary | ICD-10-CM | POA: Diagnosis not present

## 2023-10-19 DIAGNOSIS — R252 Cramp and spasm: Secondary | ICD-10-CM | POA: Diagnosis not present

## 2023-10-19 DIAGNOSIS — I10 Essential (primary) hypertension: Secondary | ICD-10-CM | POA: Diagnosis not present

## 2023-10-19 DIAGNOSIS — Z23 Encounter for immunization: Secondary | ICD-10-CM | POA: Diagnosis not present

## 2023-10-22 NOTE — Progress Notes (Signed)
[facility] - Clinic Note  11/03/2023     CHIEF COMPLAINT Patient presents for Retina Follow Up   HISTORY OF PRESENT ILLNESS: Walter Baxter is a 83 y.o. male who presents to the clinic today for:   HPI     Retina Follow Up   Patient presents with  Other.  In both eyes.  Severity is moderate.  Duration of 6 weeks.  Since onset it is stable.  I, the attending physician,  performed the HPI with the patient and updated documentation appropriately.        Comments   Patient states the eyes are watering. He saw Dr. Zenaida Niece and she put punctal plugs in. He is using PF and Ketorolac TID OS.       Last edited by Rennis Chris, MD on 11/03/2023  4:00 PM.    Patient states he saw Dr. Zenaida Niece and she put a piece of plastic and a contact lens on his right eye, he states last Thursday, she took the CL off and the piece of plastic was already gone, he states his friend gave him a pair of glasses and he feels like he can see really well out of them, he is using PF and ketorolac TID OS  Referring physician: Diona Foley, MD [address] [place],  [place] [address]  HISTORICAL INFORMATION:   Selected notes from the MEDICAL RECORD NUMBER Referred by Dr. Zenaida Niece for CME OU LEE:  Ocular Hx-  s/p CEIOL OD 09.20.23 S/p CEIOL OS 11.29.23 Currently on PF and Ketorolac QID OU -- started 01.12.24 PMH-    CURRENT MEDICATIONS: Current Outpatient Medications (Ophthalmic Drugs)  Medication Sig   ketorolac (ACULAR) 0.5 % ophthalmic solution Place 1 drop into the left eye 4 (four) times daily.   prednisoLONE acetate (PRED FORTE) 1 % ophthalmic suspension Place 1 drop into the left eye 4 (four) times daily.   No current facility-administered medications for this visit. (Ophthalmic Drugs)   Current Outpatient Medications (Other)  Medication Sig   acetaminophen (TYLENOL) 500 MG tablet Take 1,000 mg by mouth every 6 (six) hours as needed for mild pain or headache.   aspirin EC 81  MG tablet Take 81 mg by mouth daily.   clonazePAM (KLONOPIN) 0.5 MG tablet Take 0.5 mg by mouth daily as needed.   famotidine (PEPCID) 20 MG tablet Take 20 mg by mouth 2 (two) times daily.   guaiFENesin (MUCINEX) 600 MG 12 hr tablet Take 600 mg by mouth 2 (two) times daily as needed for cough or to loosen phlegm.   levothyroxine (SYNTHROID) 25 MCG tablet Take 25 mcg by mouth every morning.   metoprolol tartrate (LOPRESSOR) 50 MG tablet Take 50 mg by mouth 2 (two) times daily.   montelukast (SINGULAIR) 10 MG tablet Take 10 mg by mouth at bedtime.   Multiple Vitamins-Minerals (MULTIVITAMIN WITH MINERALS) tablet Take 1 tablet by mouth daily.   pravastatin (PRAVACHOL) 20 MG tablet Take 20 mg by mouth daily.   promethazine-codeine (PHENERGAN WITH CODEINE) 6.25-10 MG/5ML syrup Take 5 mLs by mouth every 6 (six) hours as needed for cough.   Rivaroxaban 15 & 20 MG TBPK Follow package directions: Take one 15mg  tablet by mouth twice a day. On day 22, switch to one 20mg  tablet once a day. Take with food.   tamsulosin (FLOMAX) 0.4 MG CAPS capsule Take 0.4 mg by mouth daily.   No current facility-administered medications for this visit. (Other)   REVIEW OF SYSTEMS: ROS  Positive for: Endocrine, Cardiovascular, Eyes Negative for: Constitutional, Gastrointestinal, Neurological, Skin, Genitourinary, Musculoskeletal, HENT, Respiratory, Psychiatric, Allergic/Imm, Heme/Lymph Last edited by Charlette Caffey, COT on 11/03/2023 12:26 PM.     ALLERGIES Allergies  Allergen Reactions   Other Rash    Magic mouth wash    PAST MEDICAL HISTORY Past Medical History:  Diagnosis Date   Allergic rhinitis    Angio-edema    Chronic bronchitis (HCC)    Erectile dysfunction    Essential hypertension    Hyperlipidemia    Obesity    Type 2 diabetes mellitus (HCC)    Past Surgical History:  Procedure Laterality Date   ADENOIDECTOMY     CATARACT EXTRACTION Bilateral    Oct OD, Nov OS 2023 Dr. Zenaida Niece    TONSILLECTOMY     FAMILY HISTORY Family History  Problem Relation Age of Onset   Lupus Sister    SOCIAL HISTORY Social History   Tobacco Use   Smoking status: Never   Smokeless tobacco: Never  Vaping Use   Vaping status: Never Used  Substance Use Topics   Alcohol use: Not Currently   Drug use: Never       OPHTHALMIC EXAM:  Base Eye Exam     Visual Acuity (Snellen - Linear)       Right Left   Dist cc 20/25 20/25   Dist ph cc NI NI    Correction: Glasses         Tonometry (Tonopen, 12:29 PM)       Right Left   Pressure 16 16         Pupils       Dark Light Shape React APD   Right 4 4 Irregular Minimal None   Left 4 3 Round Brisk None         Visual Fields       Left Right    Full Full         Extraocular Movement       Right Left    Full, Ortho Full, Ortho         Neuro/Psych     Oriented x3: Yes   Mood/Affect: Normal         Dilation     Both eyes: 1.0% Mydriacyl, 2.5% Phenylephrine @ 12:27 PM           Slit Lamp and Fundus Exam     Slit Lamp Exam       Right Left   Lids/Lashes Dermatochalasis - upper lid Dermatochalasis - upper lid   Conjunctiva/Sclera White and quiet White and quiet, STK STquad   Cornea tear film debris, mild arcus, well healed cataract wound, trace Punctate epithelial erosions, find endo pigment arcus, well healed cataract wound, trace PEE   Anterior Chamber deep and clear, no cell/flare deep and clear, no cell/flare   Iris irregular dilation, notch at 0700, scattered transillumination defects greatest IT quad Round and moderately dilated   Lens PC IOL in good position with open PC PC IOL in good position   Anterior Vitreous mild syneresis mild syneresis         Fundus Exam       Right Left   Disc Pink and Sharp Pink and Sharp, temporal PPA   C/D Ratio 0.6 0.5   Macula Flat, good foveal reflex, trace cystic changes -- stably improved, mild ERM, No heme or edema Flat, Blunted foveal reflex,  central cystic changes --  stably improved, mild ERM, No heme or  edema   Vessels attenuated, Tortuous attenuated, Tortuous   Periphery Attached, reticular degeneration, No heme Attached, focal CR atrophy at 1030, no heme, mild reticular degeneration           IMAGING AND PROCEDURES  Imaging and Procedures for 11/03/2023  OCT, Retina - OU - Both Eyes       Right Eye Quality was good. Central Foveal Thickness: 282. Progression has been stable. Findings include normal foveal contour, no IRF, no SRF (Stable improvement in IRF / cystic changes temporal fovea and mac -- no CME, partial PVD).   Left Eye Quality was good. Central Foveal Thickness: 271. Progression has been stable. Findings include normal foveal contour, no IRF, no SRF, intraretinal fluid (stable improvement in IRF nasal fovea -- no CME, partial PVD).   Notes *Images captured and stored on drive  Diagnosis / Impression:  CME OU (OS >> OD) OD: Stable improvement in IRF / cystic changes temporal fovea and mac-- no CME, partial PVD OS: stable improvement in IRF nasal fovea -- no CME, partial PVD  Clinical management:  See below  Abbreviations: NFP - Normal foveal profile. CME - cystoid macular edema. PED - pigment epithelial detachment. IRF - intraretinal fluid. SRF - subretinal fluid. EZ - ellipsoid zone. ERM - epiretinal membrane. ORA - outer retinal atrophy. ORT - outer retinal tubulation. SRHM - subretinal hyper-reflective material. IRHM - intraretinal hyper-reflective material            ASSESSMENT/PLAN:    ICD-10-CM   1. Cystoid macular edema of both eyes  H35.353 OCT, Retina - OU - Both Eyes    2. Diabetes mellitus type 2 without retinopathy (HCC)  E11.9     3. Essential hypertension  I10     4. Hypertensive retinopathy of both eyes  H35.033     5. Pseudophakia, both eyes  Z96.1      CME OU -- stably resolved OU - s/p CEIOL OU, Dr. Zenaida Niece (OD 09.20.23, OS 11.29.23) - no significant improvement on  01.26.24 f/u with Dr. Zenaida Niece and referred here for further evaluation and management - FA 01.30.24 shows perifoveal petaloid leakage and hyperfluorescence of disc OU -- consistent with CME / [place] - diagnosed and started PF and Ketorolac QID OU on 01.12.24 - s/p STK OD #1 (02.13.24) - s/p STK OS #1 (01.30.24), #2 (07.16.24), #3 (11.19.24)  - BCVA 20/25 OU - stable - OCT shows OD: Stable improvement in IRF / cystic changes temporal fovea and mac -- no CME; OS: stable improvement in IRF nasal fovea -- no CME, partial PVD at 4 wks post STK OD - cont PF and Ketorolac TID OS -- decrease to BID OS - f/u 6-8 weeks, DFE, OCT  2. Diabetes mellitus, type 2 without retinopathy  - A1c in the 4-5 range, not on medication - The incidence, risk factors for progression, natural history and treatment options for diabetic retinopathy  were discussed with patient.   - The need for close monitoring of blood glucose, blood pressure, and serum lipids, avoiding cigarette or any type of tobacco, and the need for long term follow up was also discussed with patient. - f/u in 1 year, sooner prn  3,4. Hypertensive retinopathy OU - discussed importance of tight BP control - continue to monitor  5. Pseudophakia OU  - s/p CE/IOL OU, Dr. Zenaida Niece (OD 09.20.23, OS 11.29.23)  - s/p Yag Cap OD (11.14.24 -- Dr. Zenaida Niece)  - IOLs in good position  - cleared to proceed with a  new glasses prescription  - monitor  Ophthalmic Meds Ordered this visit:  No orders of the defined types were placed in this encounter.    Return for f/u 6-8 weeks, CME OU, DFE, OCT.  There are no Patient Instructions on file for this visit.   Explained the diagnoses, plan, and follow up with the patient and they expressed understanding.  Patient expressed understanding of the importance of proper follow up care.   This document serves as a record of services personally performed by Karie Chimera, MD, PhD. It was created on their behalf by Laurey Morale, COT an ophthalmic technician. The creation of this record is the provider's dictation and/or activities during the visit.    Electronically signed by:  Charlette Caffey, COT  11/03/23 4:00 PM  This document serves as a record of services personally performed by Karie Chimera, MD, PhD. It was created on their behalf by Glee Arvin. Manson Passey, OA an ophthalmic technician. The creation of this record is the provider's dictation and/or activities during the visit.    Electronically signed by: Glee Arvin. Manson Passey, OA 11/03/23 4:00 PM  Karie Chimera, M.D., Ph.D. Diseases & Surgery of the Retina and Vitreous [facility]  I have reviewed the above documentation for accuracy and completeness, and I agree with the above. Karie Chimera, M.D., Ph.D. 11/03/23 4:01 PM   Abbreviations: M myopia (nearsighted); A astigmatism; H hyperopia (farsighted); P presbyopia; Mrx spectacle prescription;  CTL contact lenses; OD right eye; OS left eye; OU both eyes  XT exotropia; ET esotropia; PEK punctate epithelial keratitis; PEE punctate epithelial erosions; DES dry eye syndrome; MGD meibomian gland dysfunction; ATs artificial tears; PFAT's preservative free artificial tears; NSC nuclear sclerotic cataract; PSC posterior subcapsular cataract; ERM epi-retinal membrane; PVD posterior vitreous detachment; RD retinal detachment; DM diabetes mellitus; DR diabetic retinopathy; NPDR non-proliferative diabetic retinopathy; PDR proliferative diabetic retinopathy; CSME clinically significant macular edema; DME diabetic macular edema; dbh dot blot hemorrhages; CWS cotton wool spot; POAG primary open angle glaucoma; C/D cup-to-disc ratio; HVF humphrey visual field; GVF goldmann visual field; OCT optical coherence tomography; IOP intraocular pressure; BRVO Branch retinal vein occlusion; CRVO central retinal vein occlusion; CRAO central retinal artery occlusion; BRAO branch retinal artery occlusion; RT retinal  tear; SB scleral buckle; PPV pars plana vitrectomy; VH Vitreous hemorrhage; PRP panretinal laser photocoagulation; IVK intravitreal kenalog; VMT vitreomacular traction; MH Macular hole;  NVD neovascularization of the disc; NVE neovascularization elsewhere; AREDS age related eye disease study; ARMD age related macular degeneration; POAG primary open angle glaucoma; EBMD epithelial/anterior basement membrane dystrophy; ACIOL anterior chamber intraocular lens; IOL intraocular lens; PCIOL posterior chamber intraocular lens; Phaco/IOL phacoemulsification with intraocular lens placement; PRK photorefractive keratectomy; LASIK laser assisted in situ keratomileusis; HTN hypertension; DM diabetes mellitus; COPD chronic obstructive pulmonary disease

## 2023-10-29 DIAGNOSIS — H16223 Keratoconjunctivitis sicca, not specified as Sjogren's, bilateral: Secondary | ICD-10-CM | POA: Diagnosis not present

## 2023-10-29 DIAGNOSIS — H18833 Recurrent erosion of cornea, bilateral: Secondary | ICD-10-CM | POA: Diagnosis not present

## 2023-10-29 DIAGNOSIS — H04123 Dry eye syndrome of bilateral lacrimal glands: Secondary | ICD-10-CM | POA: Diagnosis not present

## 2023-11-03 ENCOUNTER — Ambulatory Visit (INDEPENDENT_AMBULATORY_CARE_PROVIDER_SITE_OTHER): Payer: Medicare Other | Admitting: Ophthalmology

## 2023-11-03 ENCOUNTER — Encounter (INDEPENDENT_AMBULATORY_CARE_PROVIDER_SITE_OTHER): Payer: Self-pay | Admitting: Ophthalmology

## 2023-11-03 DIAGNOSIS — Z961 Presence of intraocular lens: Secondary | ICD-10-CM | POA: Diagnosis not present

## 2023-11-03 DIAGNOSIS — H35353 Cystoid macular degeneration, bilateral: Secondary | ICD-10-CM | POA: Diagnosis not present

## 2023-11-03 DIAGNOSIS — I10 Essential (primary) hypertension: Secondary | ICD-10-CM | POA: Diagnosis not present

## 2023-11-03 DIAGNOSIS — H35033 Hypertensive retinopathy, bilateral: Secondary | ICD-10-CM | POA: Diagnosis not present

## 2023-11-03 DIAGNOSIS — E119 Type 2 diabetes mellitus without complications: Secondary | ICD-10-CM

## 2023-11-16 DIAGNOSIS — H00024 Hordeolum internum left upper eyelid: Secondary | ICD-10-CM | POA: Diagnosis not present

## 2023-11-16 DIAGNOSIS — H04123 Dry eye syndrome of bilateral lacrimal glands: Secondary | ICD-10-CM | POA: Diagnosis not present

## 2023-11-16 DIAGNOSIS — H18833 Recurrent erosion of cornea, bilateral: Secondary | ICD-10-CM | POA: Diagnosis not present

## 2023-12-17 DIAGNOSIS — H16223 Keratoconjunctivitis sicca, not specified as Sjogren's, bilateral: Secondary | ICD-10-CM | POA: Diagnosis not present

## 2023-12-17 DIAGNOSIS — H18833 Recurrent erosion of cornea, bilateral: Secondary | ICD-10-CM | POA: Diagnosis not present

## 2023-12-17 DIAGNOSIS — H04123 Dry eye syndrome of bilateral lacrimal glands: Secondary | ICD-10-CM | POA: Diagnosis not present

## 2023-12-22 NOTE — Progress Notes (Signed)
 [facility] - Clinic Note  12/23/2023     CHIEF COMPLAINT Patient presents for Retina Follow Up   HISTORY OF PRESENT ILLNESS: Walter Baxter is a 83 y.o. male who presents to the clinic today for:   HPI     Retina Follow Up   Patient presents with  Other.  In both eyes.  Severity is moderate.  Duration of 6 weeks.  Since onset it is stable.  I, the attending physician,  performed the HPI with the patient and updated documentation appropriately.        Comments   Patient states the vision is stable. He is using Ketorolac OS BID, AT's, and Pred OS BID. Sunday his blood sugar was 118.      Last edited by Rennis Chris, MD on 12/25/2023 11:50 PM.     Patient states he is on a new eye drop for dry eyes  Referring physician: Diona Foley, MD [address] [place],  [place] [address]  HISTORICAL INFORMATION:   Selected notes from the MEDICAL RECORD NUMBER Referred by Dr. Zenaida Niece for CME OU LEE:  Ocular Hx-  s/p CEIOL OD 09.20.23 S/p CEIOL OS 11.29.23 Currently on PF and Ketorolac QID OU -- started 01.12.24 PMH-    CURRENT MEDICATIONS: Current Outpatient Medications (Ophthalmic Drugs)  Medication Sig   ketorolac (ACULAR) 0.5 % ophthalmic solution Place 1 drop into the left eye 4 (four) times daily.   prednisoLONE acetate (PRED FORTE) 1 % ophthalmic suspension Place 1 drop into the left eye 4 (four) times daily.   No current facility-administered medications for this visit. (Ophthalmic Drugs)   Current Outpatient Medications (Other)  Medication Sig   acetaminophen (TYLENOL) 500 MG tablet Take 1,000 mg by mouth every 6 (six) hours as needed for mild pain or headache.   aspirin EC 81 MG tablet Take 81 mg by mouth daily.   clonazePAM (KLONOPIN) 0.5 MG tablet Take 0.5 mg by mouth daily as needed.   famotidine (PEPCID) 20 MG tablet Take 20 mg by mouth 2 (two) times daily.   guaiFENesin (MUCINEX) 600 MG 12 hr tablet Take 600 mg by mouth 2 (two) times  daily as needed for cough or to loosen phlegm.   levothyroxine (SYNTHROID) 25 MCG tablet Take 25 mcg by mouth every morning.   metoprolol tartrate (LOPRESSOR) 50 MG tablet Take 50 mg by mouth 2 (two) times daily.   montelukast (SINGULAIR) 10 MG tablet Take 10 mg by mouth at bedtime.   Multiple Vitamins-Minerals (MULTIVITAMIN WITH MINERALS) tablet Take 1 tablet by mouth daily.   pravastatin (PRAVACHOL) 20 MG tablet Take 20 mg by mouth daily.   promethazine-codeine (PHENERGAN WITH CODEINE) 6.25-10 MG/5ML syrup Take 5 mLs by mouth every 6 (six) hours as needed for cough.   Rivaroxaban 15 & 20 MG TBPK Follow package directions: Take one 15mg  tablet by mouth twice a day. On day 22, switch to one 20mg  tablet once a day. Take with food.   tamsulosin (FLOMAX) 0.4 MG CAPS capsule Take 0.4 mg by mouth daily.   No current facility-administered medications for this visit. (Other)   REVIEW OF SYSTEMS: ROS   Positive for: Endocrine, Cardiovascular, Eyes Negative for: Constitutional, Gastrointestinal, Neurological, Skin, Genitourinary, Musculoskeletal, HENT, Respiratory, Psychiatric, Allergic/Imm, Heme/Lymph Last edited by Charlette Caffey, COT on 12/23/2023 12:24 PM.      ALLERGIES Allergies  Allergen Reactions   Other Rash    Magic mouth wash    PAST MEDICAL HISTORY Past  Medical History:  Diagnosis Date   Allergic rhinitis    Angio-edema    Chronic bronchitis (HCC)    Erectile dysfunction    Essential hypertension    Hyperlipidemia    Obesity    Type 2 diabetes mellitus [facility])    Past Surgical History:  Procedure Laterality Date   ADENOIDECTOMY     CATARACT EXTRACTION Bilateral    Oct OD, Nov OS 2023 Dr. Zenaida Niece   TONSILLECTOMY     FAMILY HISTORY Family History  Problem Relation Age of Onset   Lupus Sister    SOCIAL HISTORY Social History   Tobacco Use   Smoking status: Never   Smokeless tobacco: Never  Vaping Use   Vaping status: Never Used  Substance Use Topics   Alcohol  use: Not Currently   Drug use: Never       OPHTHALMIC EXAM:  Base Eye Exam     Visual Acuity (Snellen - Linear)       Right Left   Dist cc 20/25 20/25   Dist ph cc NI NI    Correction: Glasses         Tonometry (Tonopen, 12:27 PM)       Right Left   Pressure 20 18         Pupils       Dark Light Shape   Right 4 4 Irregular   Left 4 3 Round         Visual Fields       Left Right    Full Full         Extraocular Movement       Right Left    Full, Ortho Full, Ortho         Neuro/Psych     Oriented x3: Yes   Mood/Affect: Normal         Dilation     Both eyes: 1.0% Mydriacyl, 2.5% Phenylephrine @ 12:25 PM           Slit Lamp and Fundus Exam     Slit Lamp Exam       Right Left   Lids/Lashes Dermatochalasis - upper lid Dermatochalasis - upper lid   Conjunctiva/Sclera White and quiet White and quiet, STK STquad   Cornea tear film debris, mild arcus, well healed cataract wound, trace Punctate epithelial erosions, find endo pigment arcus, well healed cataract wound, trace PEE   Anterior Chamber deep and clear, no cell/flare deep and clear, no cell/flare   Iris irregular dilation, notch at 0700, scattered transillumination defects greatest IT quad Round and moderately dilated   Lens PC IOL in good position with open PC PC IOL in good position   Anterior Vitreous mild syneresis mild syneresis         Fundus Exam       Right Left   Disc Pink and Sharp Pink and Sharp, temporal PPA   C/D Ratio 0.6 0.5   Macula Flat, good foveal reflex, trace cystic changes -- stably improved, mild ERM, No heme or edema Flat, Blunted foveal reflex, central cystic changes --  stably improved, mild ERM, No heme or edema   Vessels attenuated, Tortuous attenuated, mild tortuosity   Periphery Attached, reticular degeneration, No heme Attached, focal CR atrophy at 1030, no heme, mild reticular degeneration           IMAGING AND PROCEDURES  Imaging and  Procedures for 12/23/2023  OCT, Retina - OU - Both Eyes  Right Eye Quality was good. Central Foveal Thickness: 274. Progression has been stable. Findings include normal foveal contour, no IRF, no SRF (Stable improvement in IRF / cystic changes temporal fovea and mac -- no CME, partial PVD).   Left Eye Quality was good. Central Foveal Thickness: 270. Progression has been stable. Findings include normal foveal contour, no IRF, no SRF, intraretinal fluid (Trace cystic changes nasal mac, partial PVD).   Notes *Images captured and stored on drive  Diagnosis / Impression:  CME OU (OS >> OD) OD: Stable improvement in IRF / cystic changes temporal fovea and mac-- no CME, partial PVD OS: trace cystic changes nasal mac, partial PVD  Clinical management:  See below  Abbreviations: NFP - Normal foveal profile. CME - cystoid macular edema. PED - pigment epithelial detachment. IRF - intraretinal fluid. SRF - subretinal fluid. EZ - ellipsoid zone. ERM - epiretinal membrane. ORA - outer retinal atrophy. ORT - outer retinal tubulation. SRHM - subretinal hyper-reflective material. IRHM - intraretinal hyper-reflective material            ASSESSMENT/PLAN:    ICD-10-CM   1. Cystoid macular edema of both eyes  H35.353 OCT, Retina - OU - Both Eyes    2. Diabetes mellitus type 2 without retinopathy (HCC)  E11.9     3. Essential hypertension  I10     4. Hypertensive retinopathy of both eyes  H35.033     5. Pseudophakia, both eyes  Z96.1      CME OU -- stably resolved OU - s/p CEIOL OU, Dr. Zenaida Niece (OD 09.20.23, OS 11.29.23) - no significant improvement on 01.26.24 f/u with Dr. Zenaida Niece and referred here for further evaluation and management - FA 01.30.24 shows perifoveal petaloid leakage and hyperfluorescence of disc OU -- consistent with CME / [place] - diagnosed and started PF and Ketorolac QID OU on 01.12.24 - s/p STK OD #1 (02.13.24) - s/p STK OS #1 (01.30.24), #2 (07.16.24), #3  (11.19.24)  - BCVA 20/25 OU - stable - OCT shows OD: Stable improvement in IRF / cystic changes temporal fovea and mac -- no CME; OS: trace cystic changes nasal mac, partial PVD  - cont PF and Ketorolac BID OS - discussed possible need for long-term anti-inflammatories - f/u 3 months, DFE, OCT  2. Diabetes mellitus, type 2 without retinopathy  - A1c in the 4-5 range, not on medication - The incidence, risk factors for progression, natural history and treatment options for diabetic retinopathy  were discussed with patient.   - The need for close monitoring of blood glucose, blood pressure, and serum lipids, avoiding cigarette or any type of tobacco, and the need for long term follow up was also discussed with patient. - f/u in 1 year, sooner prn  3,4. Hypertensive retinopathy OU - discussed importance of tight BP control - continue to monitor  5. Pseudophakia OU  - s/p CE/IOL OU, Dr. Zenaida Niece (OD 09.20.23, OS 11.29.23)  - s/p Yag Cap OD (11.14.24 -- Dr. Zenaida Niece)  - IOLs in good position  - cleared to proceed with a new glasses prescription  - monitor  Ophthalmic Meds Ordered this visit:  No orders of the defined types were placed in this encounter.    Return in about 3 months (around 03/23/2024) for f/u CME OU, DFE, OCT.  There are no Patient Instructions on file for this visit.   Explained the diagnoses, plan, and follow up with the patient and they expressed understanding.  Patient expressed understanding of the importance of  proper follow up care.   This document serves as a record of services personally performed by Karie Chimera, MD, PhD. It was created on their behalf by Laurey Morale, COT an ophthalmic technician. The creation of this record is the provider's dictation and/or activities during the visit.    Electronically signed by:  Charlette Caffey, COT  12/25/23 11:51 PM  This document serves as a record of services personally performed by Karie Chimera, MD, PhD. It was  created on their behalf by Glee Arvin. Manson Passey, OA an ophthalmic technician. The creation of this record is the provider's dictation and/or activities during the visit.    Electronically signed by: Glee Arvin. Manson Passey, OA 12/25/23 11:51 PM  Karie Chimera, M.D., Ph.D. Diseases & Surgery of the Retina and Vitreous [facility]  I have reviewed the above documentation for accuracy and completeness, and I agree with the above. Karie Chimera, M.D., Ph.D. 12/25/23 11:52 PM   Abbreviations: M myopia (nearsighted); A astigmatism; H hyperopia (farsighted); P presbyopia; Mrx spectacle prescription;  CTL contact lenses; OD right eye; OS left eye; OU both eyes  XT exotropia; ET esotropia; PEK punctate epithelial keratitis; PEE punctate epithelial erosions; DES dry eye syndrome; MGD meibomian gland dysfunction; ATs artificial tears; PFAT's preservative free artificial tears; NSC nuclear sclerotic cataract; PSC posterior subcapsular cataract; ERM epi-retinal membrane; PVD posterior vitreous detachment; RD retinal detachment; DM diabetes mellitus; DR diabetic retinopathy; NPDR non-proliferative diabetic retinopathy; PDR proliferative diabetic retinopathy; CSME clinically significant macular edema; DME diabetic macular edema; dbh dot blot hemorrhages; CWS cotton wool spot; POAG primary open angle glaucoma; C/D cup-to-disc ratio; HVF humphrey visual field; GVF goldmann visual field; OCT optical coherence tomography; IOP intraocular pressure; BRVO Branch retinal vein occlusion; CRVO central retinal vein occlusion; CRAO central retinal artery occlusion; BRAO branch retinal artery occlusion; RT retinal tear; SB scleral buckle; PPV pars plana vitrectomy; VH Vitreous hemorrhage; PRP panretinal laser photocoagulation; IVK intravitreal kenalog; VMT vitreomacular traction; MH Macular hole;  NVD neovascularization of the disc; NVE neovascularization elsewhere; AREDS age related eye disease study; ARMD age  related macular degeneration; POAG primary open angle glaucoma; EBMD epithelial/anterior basement membrane dystrophy; ACIOL anterior chamber intraocular lens; IOL intraocular lens; PCIOL posterior chamber intraocular lens; Phaco/IOL phacoemulsification with intraocular lens placement; PRK photorefractive keratectomy; LASIK laser assisted in situ keratomileusis; HTN hypertension; DM diabetes mellitus; COPD chronic obstructive pulmonary disease

## 2023-12-23 ENCOUNTER — Ambulatory Visit (INDEPENDENT_AMBULATORY_CARE_PROVIDER_SITE_OTHER): Payer: Medicare Other | Admitting: Ophthalmology

## 2023-12-23 DIAGNOSIS — Z961 Presence of intraocular lens: Secondary | ICD-10-CM | POA: Diagnosis not present

## 2023-12-23 DIAGNOSIS — H35033 Hypertensive retinopathy, bilateral: Secondary | ICD-10-CM

## 2023-12-23 DIAGNOSIS — I10 Essential (primary) hypertension: Secondary | ICD-10-CM | POA: Diagnosis not present

## 2023-12-23 DIAGNOSIS — H35353 Cystoid macular degeneration, bilateral: Secondary | ICD-10-CM | POA: Diagnosis not present

## 2023-12-23 DIAGNOSIS — E119 Type 2 diabetes mellitus without complications: Secondary | ICD-10-CM | POA: Diagnosis not present

## 2023-12-25 ENCOUNTER — Encounter (INDEPENDENT_AMBULATORY_CARE_PROVIDER_SITE_OTHER): Payer: Self-pay | Admitting: Ophthalmology

## 2024-01-27 DIAGNOSIS — H04123 Dry eye syndrome of bilateral lacrimal glands: Secondary | ICD-10-CM | POA: Diagnosis not present

## 2024-02-10 DIAGNOSIS — J069 Acute upper respiratory infection, unspecified: Secondary | ICD-10-CM | POA: Diagnosis not present

## 2024-03-21 DIAGNOSIS — J42 Unspecified chronic bronchitis: Secondary | ICD-10-CM | POA: Diagnosis not present

## 2024-03-21 DIAGNOSIS — I10 Essential (primary) hypertension: Secondary | ICD-10-CM | POA: Diagnosis not present

## 2024-03-21 DIAGNOSIS — E1169 Type 2 diabetes mellitus with other specified complication: Secondary | ICD-10-CM | POA: Diagnosis not present

## 2024-03-22 NOTE — Progress Notes (Signed)
 [facility] - Clinic Note  03/28/2024     CHIEF COMPLAINT Patient presents for Retina Follow Up   HISTORY OF PRESENT ILLNESS: Walter Baxter is a 83 y.o. male who presents to the clinic today for:   HPI     Retina Follow Up   Patient presents with  Other.  In both eyes.  Severity is moderate.  Duration of 3 months.  Since onset it is stable.  I, the attending physician,  performed the HPI with the patient and updated documentation appropriately.        Comments   3 month Retina eval. Patient states it's hard to tell if any changes in vision      Last edited by Valdemar Rogue, MD on 03/28/2024  1:03 PM.    Patient states some days he sees good and some days he doesn't, he is still using PF and ketorolac  BID OS, he is using drops for dry eyes that Dr. Fleeta prescribed him  Referring physician: [name], MD [address] [place],  [place] [address]  HISTORICAL INFORMATION:   Selected notes from the MEDICAL RECORD NUMBER Referred by Dr. Fleeta for CME OU LEE:  Ocular Hx-  s/p CEIOL OD 09.20.23 S/p CEIOL OS 11.29.23 Currently on PF and Ketorolac  QID OU -- started 01.12.24 PMH-    CURRENT MEDICATIONS: Current Outpatient Medications (Ophthalmic Drugs)  Medication Sig   ketorolac  (ACULAR ) 0.5 % ophthalmic solution Place 1 drop into the left eye in the morning and at bedtime.   prednisoLONE  acetate (PRED FORTE ) 1 % ophthalmic suspension Place 1 drop into the left eye in the morning and at bedtime.   No current facility-administered medications for this visit. (Ophthalmic Drugs)   Current Outpatient Medications (Other)  Medication Sig   acetaminophen  (TYLENOL ) 500 MG tablet Take 1,000 mg by mouth every 6 (six) hours as needed for mild pain or headache.   aspirin EC 81 MG tablet Take 81 mg by mouth daily.   clonazePAM (KLONOPIN) 0.5 MG tablet Take 0.5 mg by mouth daily as needed.   famotidine  (PEPCID ) 20 MG tablet Take 20 mg by mouth 2 (two) times  daily.   guaiFENesin  (MUCINEX ) 600 MG 12 hr tablet Take 600 mg by mouth 2 (two) times daily as needed for cough or to loosen phlegm.   levothyroxine (SYNTHROID) 25 MCG tablet Take 25 mcg by mouth every morning.   metoprolol  tartrate (LOPRESSOR ) 50 MG tablet Take 50 mg by mouth 2 (two) times daily.   montelukast  (SINGULAIR ) 10 MG tablet Take 10 mg by mouth at bedtime.   Multiple Vitamins-Minerals (MULTIVITAMIN WITH MINERALS) tablet Take 1 tablet by mouth daily.   pravastatin  (PRAVACHOL ) 20 MG tablet Take 20 mg by mouth daily.   promethazine-codeine (PHENERGAN WITH CODEINE) 6.25-10 MG/5ML syrup Take 5 mLs by mouth every 6 (six) hours as needed for cough.   Rivaroxaban  15 & 20 MG TBPK Follow package directions: Take one 15mg  tablet by mouth twice a day. On day 22, switch to one 20mg  tablet once a day. Take with food.   tamsulosin  (FLOMAX ) 0.4 MG CAPS capsule Take 0.4 mg by mouth daily.   No current facility-administered medications for this visit. (Other)   REVIEW OF SYSTEMS: ROS   Positive for: Endocrine, Cardiovascular, Eyes Negative for: Constitutional, Gastrointestinal, Neurological, Skin, Genitourinary, Musculoskeletal, HENT, Respiratory, Psychiatric, Allergic/Imm, Heme/Lymph Last edited by German Olam BRAVO, COT on 03/28/2024 12:28 PM.       ALLERGIES Allergies  Allergen  Reactions   Other Rash    Magic mouth wash    PAST MEDICAL HISTORY Past Medical History:  Diagnosis Date   Allergic rhinitis    Angio-edema    Chronic bronchitis (HCC)    Erectile dysfunction    Essential hypertension    Hyperlipidemia    Obesity    Type 2 diabetes mellitus (HCC)    Past Surgical History:  Procedure Laterality Date   ADENOIDECTOMY     CATARACT EXTRACTION Bilateral    Oct OD, Nov OS 2023 Dr. Fleeta   TONSILLECTOMY     FAMILY HISTORY Family History  Problem Relation Age of Onset   Lupus Sister    SOCIAL HISTORY Social History   Tobacco Use   Smoking status: Never   Smokeless tobacco:  Never  Vaping Use   Vaping status: Never Used  Substance Use Topics   Alcohol use: Not Currently   Drug use: Never       OPHTHALMIC EXAM:  Base Eye Exam     Visual Acuity (Snellen - Linear)       Right Left   Dist cc 20/25 -2 20/25 -2   Dist ph cc 20/NI 20/NI    Correction: Glasses         Tonometry (Tonopen, 12:36 PM)       Right Left   Pressure 9 10         Pupils       Dark Light Shape React APD   Right 3 2 Round Brisk None   Left 3 2 Round Brisk None         Visual Fields (Counting fingers)       Left Right    Full Full         Extraocular Movement       Right Left    Full, Ortho Full, Ortho         Neuro/Psych     Oriented x3: Yes   Mood/Affect: Normal         Dilation     Both eyes: 2.5% Phenylephrine, 1.0% Mydriacyl @ 12:37 PM           Slit Lamp and Fundus Exam     Slit Lamp Exam       Right Left   Lids/Lashes Dermatochalasis - upper lid Dermatochalasis - upper lid   Conjunctiva/Sclera White and quiet White and quiet, STK STquad   Cornea Trace tear film debris, arcus, well healed cataract wound, trace Punctate epithelial erosions, find endo pigment arcus, well healed cataract wound, trace PEE   Anterior Chamber deep and clear, no cell/flare deep and clear, no cell/flare   Iris irregular dilation, notch at 0700, scattered transillumination defects greatest IT quad Round and moderately dilated   Lens PC IOL in good position with open PC PC IOL in good position   Anterior Vitreous mild syneresis mild syneresis         Fundus Exam       Right Left   Disc Pink and Sharp Pink and Sharp, temporal PPA   C/D Ratio 0.6 0.5   Macula Flat, good foveal reflex, trace cystic changes -- stably improved, mild ERM, No heme or edema Flat, Blunted foveal reflex, central cystic changes --  stably improved, mild ERM, No heme or edema   Vessels attenuated, Tortuous attenuated, mild tortuosity   Periphery Attached, reticular  degeneration, No heme Attached, focal CR atrophy at 1030, no heme, mild reticular degeneration  IMAGING AND PROCEDURES  Imaging and Procedures for 03/28/2024  OCT, Retina - OU - Both Eyes       Right Eye Quality was good. Central Foveal Thickness: 273. Progression has been stable. Findings include normal foveal contour, no IRF, no SRF (Stable improvement in IRF / cystic changes temporal fovea and mac -- no CME, partial PVD).   Left Eye Quality was good. Central Foveal Thickness: 277. Progression has improved. Findings include normal foveal contour, no IRF, no SRF, intraretinal fluid (Mild interval improvement in cystic changes nasal mac -- just trace cystic changes remain, partial PVD).   Notes *Images captured and stored on drive  Diagnosis / Impression:  CME OU (OS >> OD) OD: Stable improvement in IRF / cystic changes temporal fovea and mac-- no CME, partial PVD OS: Mild interval improvement in cystic changes nasal mac -- just trace cystic changes remain, partial PVD  Clinical management:  See below  Abbreviations: NFP - Normal foveal profile. CME - cystoid macular edema. PED - pigment epithelial detachment. IRF - intraretinal fluid. SRF - subretinal fluid. EZ - ellipsoid zone. ERM - epiretinal membrane. ORA - outer retinal atrophy. ORT - outer retinal tubulation. SRHM - subretinal hyper-reflective material. IRHM - intraretinal hyper-reflective material            ASSESSMENT/PLAN:    ICD-10-CM   1. Cystoid macular edema of both eyes  H35.353 OCT, Retina - OU - Both Eyes    2. Diabetes mellitus type 2 without retinopathy (HCC)  E11.9     3. Essential hypertension  I10     4. Hypertensive retinopathy of both eyes  H35.033     5. Pseudophakia, both eyes  Z96.1       CME OU -- stably resolved OU - s/p CEIOL OU, Dr. Fleeta (OD 09.20.23, OS 11.29.23) - no significant improvement on 01.26.24 f/u with Dr. Fleeta and referred here for further evaluation and  management - FA 01.30.24 shows perifoveal petaloid leakage and hyperfluorescence of disc OU -- consistent with CME / [place] - diagnosed and started PF and Ketorolac  QID OU on 01.12.24 - s/p STK OD #1 (02.13.24) - s/p STK OS #1 (01.30.24), #2 (07.16.24), #3 (11.19.24)  - BCVA 20/25 OU - stable - OCT shows OD: Stable improvement in IRF / cystic changes temporal fovea and mac -- no CME; OS: Mild interval improvement in cystic changes nasal mac -- just trace cystic changes remain, partial PVD  - cont PF and Ketorolac  BID OS - discussed possible need for long-term anti-inflammatories - f/u 4 months, DFE, OCT  2. Diabetes mellitus, type 2 without retinopathy  - A1c 5.9 on 01.27.25 - The incidence, risk factors for progression, natural history and treatment options for diabetic retinopathy  were discussed with patient.   - The need for close monitoring of blood glucose, blood pressure, and serum lipids, avoiding cigarette or any type of tobacco, and the need for long term follow up was also discussed with patient.  - f/u in 1 year, sooner prn  3,4. Hypertensive retinopathy OU - discussed importance of tight BP control - monitor  5. Pseudophakia OU  - s/p CE/IOL OU, Dr. Fleeta (OD 09.20.23, OS 11.29.23)  - s/p Yag Cap OD (11.14.24 -- Dr. Fleeta)  - IOLs in good position  - cleared to proceed with a new glasses prescription  - monitor  Ophthalmic Meds Ordered this visit:  Meds ordered this encounter  Medications   ketorolac  (ACULAR ) 0.5 % ophthalmic solution  Sig: Place 1 drop into the left eye in the morning and at bedtime.    Dispense:  10 mL    Refill:  6   prednisoLONE  acetate (PRED FORTE ) 1 % ophthalmic suspension    Sig: Place 1 drop into the left eye in the morning and at bedtime.    Dispense:  15 mL    Refill:  6     Return in about 4 months (around 07/29/2024) for f/u CME OU, DFE, OCT.  There are no Patient Instructions on file for this visit.   Explained the diagnoses,  plan, and follow up with the patient and they expressed understanding.  Patient expressed understanding of the importance of proper follow up care.   This document serves as a record of services personally performed by Redell JUDITHANN Hans, MD, PhD. It was created on their behalf by Avelina Pereyra, COA an ophthalmic technician. The creation of this record is the provider's dictation and/or activities during the visit.   Electronically signed by: Avelina GORMAN Pereyra, COT  03/28/24  1:07 PM   This document serves as a record of services personally performed by Redell JUDITHANN Hans, MD, PhD. It was created on their behalf by Alan PARAS. Delores, OA an ophthalmic technician. The creation of this record is the provider's dictation and/or activities during the visit.    Electronically signed by: Alan PARAS. Delores, OA 03/28/24 1:07 PM  Redell JUDITHANN Hans, M.D., Ph.D. Diseases & Surgery of the Retina and Vitreous [facility]  I have reviewed the above documentation for accuracy and completeness, and I agree with the above. Redell JUDITHANN Hans, M.D., Ph.D. 03/28/24 1:08 PM   Abbreviations: M myopia (nearsighted); A astigmatism; H hyperopia (farsighted); P presbyopia; Mrx spectacle prescription;  CTL contact lenses; OD right eye; OS left eye; OU both eyes  XT exotropia; ET esotropia; PEK punctate epithelial keratitis; PEE punctate epithelial erosions; DES dry eye syndrome; MGD meibomian gland dysfunction; ATs artificial tears; PFAT's preservative free artificial tears; NSC nuclear sclerotic cataract; PSC posterior subcapsular cataract; ERM epi-retinal membrane; PVD posterior vitreous detachment; RD retinal detachment; DM diabetes mellitus; DR diabetic retinopathy; NPDR non-proliferative diabetic retinopathy; PDR proliferative diabetic retinopathy; CSME clinically significant macular edema; DME diabetic macular edema; dbh dot blot hemorrhages; CWS cotton wool spot; POAG primary open angle glaucoma; C/D cup-to-disc  ratio; HVF humphrey visual field; GVF goldmann visual field; OCT optical coherence tomography; IOP intraocular pressure; BRVO Branch retinal vein occlusion; CRVO central retinal vein occlusion; CRAO central retinal artery occlusion; BRAO branch retinal artery occlusion; RT retinal tear; SB scleral buckle; PPV pars plana vitrectomy; VH Vitreous hemorrhage; PRP panretinal laser photocoagulation; IVK intravitreal kenalog ; VMT vitreomacular traction; MH Macular hole;  NVD neovascularization of the disc; NVE neovascularization elsewhere; AREDS age related eye disease study; ARMD age related macular degeneration; POAG primary open angle glaucoma; EBMD epithelial/anterior basement membrane dystrophy; ACIOL anterior chamber intraocular lens; IOL intraocular lens; PCIOL posterior chamber intraocular lens; Phaco/IOL phacoemulsification with intraocular lens placement; PRK photorefractive keratectomy; LASIK laser assisted in situ keratomileusis; HTN hypertension; DM diabetes mellitus; COPD chronic obstructive pulmonary disease

## 2024-03-28 ENCOUNTER — Ambulatory Visit (INDEPENDENT_AMBULATORY_CARE_PROVIDER_SITE_OTHER): Admitting: Ophthalmology

## 2024-03-28 ENCOUNTER — Encounter (INDEPENDENT_AMBULATORY_CARE_PROVIDER_SITE_OTHER): Payer: Self-pay | Admitting: Ophthalmology

## 2024-03-28 DIAGNOSIS — I10 Essential (primary) hypertension: Secondary | ICD-10-CM | POA: Diagnosis not present

## 2024-03-28 DIAGNOSIS — H35033 Hypertensive retinopathy, bilateral: Secondary | ICD-10-CM | POA: Diagnosis not present

## 2024-03-28 DIAGNOSIS — H35353 Cystoid macular degeneration, bilateral: Secondary | ICD-10-CM

## 2024-03-28 DIAGNOSIS — E119 Type 2 diabetes mellitus without complications: Secondary | ICD-10-CM

## 2024-03-28 DIAGNOSIS — Z961 Presence of intraocular lens: Secondary | ICD-10-CM

## 2024-03-28 MED ORDER — PREDNISOLONE ACETATE 1 % OP SUSP: 1.0000 [drp] | Freq: Two times a day (BID) | OPHTHALMIC | 6 refills | Status: AC

## 2024-03-28 MED ORDER — KETOROLAC TROMETHAMINE 0.5 % OP SOLN: 1.0000 [drp] | Freq: Two times a day (BID) | OPHTHALMIC | 6 refills | Status: AC

## 2024-04-15 DIAGNOSIS — I10 Essential (primary) hypertension: Secondary | ICD-10-CM | POA: Diagnosis not present

## 2024-04-15 DIAGNOSIS — E1169 Type 2 diabetes mellitus with other specified complication: Secondary | ICD-10-CM | POA: Diagnosis not present

## 2024-04-15 DIAGNOSIS — J42 Unspecified chronic bronchitis: Secondary | ICD-10-CM | POA: Diagnosis not present

## 2024-04-15 DIAGNOSIS — E039 Hypothyroidism, unspecified: Secondary | ICD-10-CM | POA: Diagnosis not present

## 2024-04-15 DIAGNOSIS — J309 Allergic rhinitis, unspecified: Secondary | ICD-10-CM | POA: Diagnosis not present

## 2024-04-15 DIAGNOSIS — Z23 Encounter for immunization: Secondary | ICD-10-CM | POA: Diagnosis not present

## 2024-04-15 DIAGNOSIS — Z8616 Personal history of COVID-19: Secondary | ICD-10-CM | POA: Diagnosis not present

## 2024-04-15 DIAGNOSIS — E782 Mixed hyperlipidemia: Secondary | ICD-10-CM | POA: Diagnosis not present

## 2024-04-15 DIAGNOSIS — Z0000 Encounter for general adult medical examination without abnormal findings: Secondary | ICD-10-CM | POA: Diagnosis not present

## 2024-04-21 DIAGNOSIS — I10 Essential (primary) hypertension: Secondary | ICD-10-CM | POA: Diagnosis not present

## 2024-04-21 DIAGNOSIS — J42 Unspecified chronic bronchitis: Secondary | ICD-10-CM | POA: Diagnosis not present

## 2024-04-21 DIAGNOSIS — E1169 Type 2 diabetes mellitus with other specified complication: Secondary | ICD-10-CM | POA: Diagnosis not present

## 2024-05-22 DIAGNOSIS — I10 Essential (primary) hypertension: Secondary | ICD-10-CM | POA: Diagnosis not present

## 2024-05-22 DIAGNOSIS — J42 Unspecified chronic bronchitis: Secondary | ICD-10-CM | POA: Diagnosis not present

## 2024-05-22 DIAGNOSIS — E1169 Type 2 diabetes mellitus with other specified complication: Secondary | ICD-10-CM | POA: Diagnosis not present

## 2024-06-03 DIAGNOSIS — M7071 Other bursitis of hip, right hip: Secondary | ICD-10-CM | POA: Diagnosis not present

## 2024-06-21 DIAGNOSIS — J42 Unspecified chronic bronchitis: Secondary | ICD-10-CM | POA: Diagnosis not present

## 2024-06-21 DIAGNOSIS — E1169 Type 2 diabetes mellitus with other specified complication: Secondary | ICD-10-CM | POA: Diagnosis not present

## 2024-06-21 DIAGNOSIS — I10 Essential (primary) hypertension: Secondary | ICD-10-CM | POA: Diagnosis not present

## 2024-07-07 DIAGNOSIS — H16223 Keratoconjunctivitis sicca, not specified as Sjogren's, bilateral: Secondary | ICD-10-CM | POA: Diagnosis not present

## 2024-07-07 DIAGNOSIS — H18833 Recurrent erosion of cornea, bilateral: Secondary | ICD-10-CM | POA: Diagnosis not present

## 2024-07-07 DIAGNOSIS — H04123 Dry eye syndrome of bilateral lacrimal glands: Secondary | ICD-10-CM | POA: Diagnosis not present

## 2024-07-26 NOTE — Progress Notes (Signed)
 [facility] - Clinic Note  07/29/2024     CHIEF COMPLAINT Patient presents for Retina Follow Up   HISTORY OF PRESENT ILLNESS: Walter Baxter is a 83 y.o. male who presents to the clinic today for:   HPI     Retina Follow Up   Patient presents with  Other.  In both eyes.  This started 4 months ago.  I, the attending physician,  performed the HPI with the patient and updated documentation appropriately.        Comments   Patient here for 4 months retina follow up for CME OU. Patient states vision some days better than other days. No eye pain. Using drops.       Last edited by Valdemar Rogue, MD on 08/06/2024  3:59 PM.     Patient saw Dr. Fleeta on 10.13 and had an amniotic membrane graft placed, removed on 10.16. Pt on PF QID OU.  Referring physician: [name], MD [address] [place],  [place] [address]  HISTORICAL INFORMATION:   Selected notes from the MEDICAL RECORD NUMBER Referred by Dr. Fleeta for CME OU LEE:  Ocular Hx-  s/p CEIOL OD 09.20.23 S/p CEIOL OS 11.29.23 Currently on PF and Ketorolac  QID OU -- started 01.12.24 PMH-    CURRENT MEDICATIONS: Current Outpatient Medications (Ophthalmic Drugs)  Medication Sig   ketorolac  (ACULAR ) 0.5 % ophthalmic solution Place 1 drop into the left eye in the morning and at bedtime.   prednisoLONE  acetate (PRED FORTE ) 1 % ophthalmic suspension Place 1 drop into the left eye in the morning and at bedtime.   No current facility-administered medications for this visit. (Ophthalmic Drugs)   Current Outpatient Medications (Other)  Medication Sig   acetaminophen  (TYLENOL ) 500 MG tablet Take 1,000 mg by mouth every 6 (six) hours as needed for mild pain or headache.   aspirin EC 81 MG tablet Take 81 mg by mouth daily.   clonazePAM (KLONOPIN) 0.5 MG tablet Take 0.5 mg by mouth daily as needed.   famotidine  (PEPCID ) 20 MG tablet Take 20 mg by mouth 2 (two) times daily.   guaiFENesin  (MUCINEX ) 600 MG  12 hr tablet Take 600 mg by mouth 2 (two) times daily as needed for cough or to loosen phlegm.   levothyroxine (SYNTHROID) 25 MCG tablet Take 25 mcg by mouth every morning.   metoprolol  tartrate (LOPRESSOR ) 50 MG tablet Take 50 mg by mouth 2 (two) times daily.   montelukast  (SINGULAIR ) 10 MG tablet Take 10 mg by mouth at bedtime.   Multiple Vitamins-Minerals (MULTIVITAMIN WITH MINERALS) tablet Take 1 tablet by mouth daily.   pravastatin  (PRAVACHOL ) 20 MG tablet Take 20 mg by mouth daily.   promethazine-codeine (PHENERGAN WITH CODEINE) 6.25-10 MG/5ML syrup Take 5 mLs by mouth every 6 (six) hours as needed for cough.   Rivaroxaban  15 & 20 MG TBPK Follow package directions: Take one 15mg  tablet by mouth twice a day. On day 22, switch to one 20mg  tablet once a day. Take with food.   tamsulosin  (FLOMAX ) 0.4 MG CAPS capsule Take 0.4 mg by mouth daily.   No current facility-administered medications for this visit. (Other)   REVIEW OF SYSTEMS: ROS   Positive for: Endocrine, Cardiovascular, Eyes Negative for: Constitutional, Gastrointestinal, Neurological, Skin, Genitourinary, Musculoskeletal, HENT, Respiratory, Psychiatric, Allergic/Imm, Heme/Lymph Last edited by Orval Asberry RAMAN, COA on 07/29/2024 12:09 PM.        ALLERGIES Allergies  Allergen Reactions   Other Rash  Magic mouth wash    PAST MEDICAL HISTORY Past Medical History:  Diagnosis Date   Allergic rhinitis    Angio-edema    Chronic bronchitis (HCC)    Erectile dysfunction    Essential hypertension    Hyperlipidemia    Obesity    Type 2 diabetes mellitus (HCC)    Past Surgical History:  Procedure Laterality Date   ADENOIDECTOMY     CATARACT EXTRACTION Bilateral    Oct OD, Nov OS 2023 Dr. Fleeta   TONSILLECTOMY     FAMILY HISTORY Family History  Problem Relation Age of Onset   Lupus Sister    SOCIAL HISTORY Social History   Tobacco Use   Smoking status: Never   Smokeless tobacco: Never  Vaping Use   Vaping  status: Never Used  Substance Use Topics   Alcohol use: Not Currently   Drug use: Never       OPHTHALMIC EXAM:  Base Eye Exam     Visual Acuity (Snellen - Linear)       Right Left   Dist cc 20/25 20/20 -1    Correction: Glasses         Tonometry (Tonopen, 12:06 PM)       Right Left   Pressure 12 20         Pupils       Dark Light Shape React APD   Right 4 3 Irregular Slow None   Left 3 2 Round Brisk None         Visual Fields (Counting fingers)       Left Right    Full Full         Extraocular Movement       Right Left    Full, Ortho Full, Ortho         Neuro/Psych     Oriented x3: Yes   Mood/Affect: Normal         Dilation     Both eyes: 1.0% Mydriacyl, 2.5% Phenylephrine @ 12:06 PM           Slit Lamp and Fundus Exam     Slit Lamp Exam       Right Left   Lids/Lashes Dermatochalasis - upper lid Dermatochalasis - upper lid   Conjunctiva/Sclera White and quiet White and quiet, STK STquad   Cornea Trace tear film debris, arcus, well healed cataract wound, trace Punctate epithelial erosions, find endo pigment arcus, well healed cataract wound, trace PEE   Anterior Chamber deep and clear, no cell/flare deep and clear, no cell/flare   Iris irregular dilation, notch at 0700, scattered transillumination defects greatest IT quad Round and moderately dilated   Lens PC IOL in good position with open PC PC IOL in good position   Anterior Vitreous mild syneresis mild syneresis         Fundus Exam       Right Left   Disc Pink and Sharp Pink and Sharp, temporal PPA   C/D Ratio 0.6 0.5   Macula Flat, good foveal reflex, trace cystic changes -- stably improved, mild ERM, No heme or edema Flat, good foveal reflex, trace central cystic changes, mild ERM, No heme or edema   Vessels attenuated, Tortuous attenuated, mild tortuosity   Periphery Attached, reticular degeneration, No heme Attached, focal CR atrophy at 1030, no heme, mild reticular  degeneration           Refraction     Wearing Rx       Sphere  Cylinder Axis Add   Right -1.00 +0.50 090 +2.50   Left -2.25 +1.00 160 +2.50           IMAGING AND PROCEDURES  Imaging and Procedures for 07/29/2024  OCT, Retina - OU - Both Eyes       Right Eye Quality was good. Central Foveal Thickness: 272. Progression has been stable. Findings include normal foveal contour, no IRF, no SRF (Stable improvement in IRF / cystic changes temporal fovea and mac -- no CME, partial PVD).   Left Eye Quality was good. Central Foveal Thickness: 274. Progression has been stable. Findings include normal foveal contour, no IRF, no SRF, intraretinal fluid (Persistent cystic changes nasal mac -- just trace cystic changes remain, partial PVD).   Notes *Images captured and stored on drive  Diagnosis / Impression:  CME OU (OS >> OD) OD: Stable improvement in IRF / cystic changes temporal fovea and mac-- no CME, partial PVD OS: Persistent cystic changes nasal mac -- just trace cystic changes remain, partial PVD  Clinical management:  See below  Abbreviations: NFP - Normal foveal profile. CME - cystoid macular edema. PED - pigment epithelial detachment. IRF - intraretinal fluid. SRF - subretinal fluid. EZ - ellipsoid zone. ERM - epiretinal membrane. ORA - outer retinal atrophy. ORT - outer retinal tubulation. SRHM - subretinal hyper-reflective material. IRHM - intraretinal hyper-reflective material             ASSESSMENT/PLAN:    ICD-10-CM   1. Cystoid macular edema of both eyes  H35.353 OCT, Retina - OU - Both Eyes    2. Diabetes mellitus type 2 without retinopathy (HCC)  E11.9     3. Essential hypertension  I10     4. Hypertensive retinopathy of both eyes  H35.033     5. Pseudophakia, both eyes  Z96.1        CME OU -- stably resolved OU - s/p CEIOL OU, Dr. Fleeta (OD 09.20.23, OS 11.29.23) - no significant improvement on 01.26.24 f/u with Dr. Fleeta and referred here for  further evaluation and management - FA 01.30.24 shows perifoveal petaloid leakage and hyperfluorescence of disc OU -- consistent with CME / [place] - diagnosed and started PF and Ketorolac  QID OU on 01.12.24 - s/p STK OD #1 (02.13.24) - s/p STK OS #1 (01.30.24), #2 (07.16.24), #3 (11.19.24)  - BCVA 20/25 OU - stable - OCT shows OD: Stable improvement in IRF / cystic changes temporal fovea and mac -- no CME; OS: Persistent cystic changes nasal mac -- just trace cystic changes remain, partial PVD  - cont PF and Ketorolac  BID OS--on TID OS per Dr. Roxianne instructions for corneal treatment - discussed possible need for long-term anti-inflammatories - f/u 4 months, DFE, OCT  2. Diabetes mellitus, type 2 without retinopathy  - A1c 5.9 on 01.27.25 - The incidence, risk factors for progression, natural history and treatment options for diabetic retinopathy  were discussed with patient.   - The need for close monitoring of blood glucose, blood pressure, and serum lipids, avoiding cigarette or any type of tobacco, and the need for long term follow up was also discussed with patient.  - f/u in 1 year, sooner prn  3,4. Hypertensive retinopathy OU - discussed importance of tight BP control - monitor  5. Pseudophakia OU  - s/p CE/IOL OU, Dr. Fleeta (OD 09.20.23, OS 11.29.23)  - s/p Yag Cap OD (11.14.24 -- Dr. Fleeta)  - IOLs in good position  - cleared to proceed with a  new glasses prescription  - monitor   Ophthalmic Meds Ordered this visit:  No orders of the defined types were placed in this encounter.    Return in about 4 months (around 11/26/2024) for CME OU, DFE, OCT.  There are no Patient Instructions on file for this visit.   This document serves as a record of services personally performed by Redell JUDITHANN Hans, MD, PhD. It was created on their behalf by Delon Newness COT, an ophthalmic technician. The creation of this record is the provider's dictation and/or activities during the  visit.    Electronically signed by: Delon Newness COT 11.04.25  4:04 PM  This document serves as a record of services personally performed by Redell JUDITHANN Hans, MD, PhD. It was created on their behalf by Almetta Pesa, an ophthalmic technician. The creation of this record is the provider's dictation and/or activities during the visit.    Electronically signed by: Almetta Pesa, OA, 08/06/24  4:04 PM  Redell JUDITHANN Hans, M.D., Ph.D. Diseases & Surgery of the Retina and Vitreous [facility] 07/29/2024   I have reviewed the above documentation for accuracy and completeness, and I agree with the above. Redell JUDITHANN Hans, M.D., Ph.D. 08/06/24 4:05 PM    M myopia (nearsighted); A astigmatism; H hyperopia (farsighted); P presbyopia; Mrx spectacle prescription;  CTL contact lenses; OD right eye; OS left eye; OU both eyes  XT exotropia; ET esotropia; PEK punctate epithelial keratitis; PEE punctate epithelial erosions; DES dry eye syndrome; MGD meibomian gland dysfunction; ATs artificial tears; PFAT's preservative free artificial tears; NSC nuclear sclerotic cataract; PSC posterior subcapsular cataract; ERM epi-retinal membrane; PVD posterior vitreous detachment; RD retinal detachment; DM diabetes mellitus; DR diabetic retinopathy; NPDR non-proliferative diabetic retinopathy; PDR proliferative diabetic retinopathy; CSME clinically significant macular edema; DME diabetic macular edema; dbh dot blot hemorrhages; CWS cotton wool spot; POAG primary open angle glaucoma; C/D cup-to-disc ratio; HVF humphrey visual field; GVF goldmann visual field; OCT optical coherence tomography; IOP intraocular pressure; BRVO Branch retinal vein occlusion; CRVO central retinal vein occlusion; CRAO central retinal artery occlusion; BRAO branch retinal artery occlusion; RT retinal tear; SB scleral buckle; PPV pars plana vitrectomy; VH Vitreous hemorrhage; PRP panretinal laser photocoagulation; IVK  intravitreal kenalog ; VMT vitreomacular traction; MH Macular hole;  NVD neovascularization of the disc; NVE neovascularization elsewhere; AREDS age related eye disease study; ARMD age related macular degeneration; POAG primary open angle glaucoma; EBMD epithelial/anterior basement membrane dystrophy; ACIOL anterior chamber intraocular lens; IOL intraocular lens; PCIOL posterior chamber intraocular lens; Phaco/IOL phacoemulsification with intraocular lens placement; PRK photorefractive keratectomy; LASIK laser assisted in situ keratomileusis; HTN hypertension; DM diabetes mellitus; COPD chronic obstructive pulmonary disease

## 2024-07-29 ENCOUNTER — Encounter (INDEPENDENT_AMBULATORY_CARE_PROVIDER_SITE_OTHER): Payer: Self-pay | Admitting: Ophthalmology

## 2024-07-29 ENCOUNTER — Ambulatory Visit (INDEPENDENT_AMBULATORY_CARE_PROVIDER_SITE_OTHER): Admitting: Ophthalmology

## 2024-07-29 DIAGNOSIS — H35353 Cystoid macular degeneration, bilateral: Secondary | ICD-10-CM

## 2024-07-29 DIAGNOSIS — I10 Essential (primary) hypertension: Secondary | ICD-10-CM | POA: Diagnosis not present

## 2024-07-29 DIAGNOSIS — H35033 Hypertensive retinopathy, bilateral: Secondary | ICD-10-CM | POA: Diagnosis not present

## 2024-07-29 DIAGNOSIS — E119 Type 2 diabetes mellitus without complications: Secondary | ICD-10-CM | POA: Diagnosis not present

## 2024-07-29 DIAGNOSIS — Z961 Presence of intraocular lens: Secondary | ICD-10-CM

## 2024-08-06 ENCOUNTER — Encounter (INDEPENDENT_AMBULATORY_CARE_PROVIDER_SITE_OTHER): Payer: Self-pay | Admitting: Ophthalmology

## 2024-11-28 ENCOUNTER — Encounter (INDEPENDENT_AMBULATORY_CARE_PROVIDER_SITE_OTHER): Admitting: Ophthalmology
